# Patient Record
Sex: Female | Born: 1935 | Race: White | Hispanic: No | Smoking: Former smoker
Health system: Southern US, Community
[De-identification: ages and names within clinical notes are randomized; demographics above are authoritative.]

## PROBLEM LIST (undated history)

## (undated) DIAGNOSIS — R4189 Other symptoms and signs involving cognitive functions and awareness: Secondary | ICD-10-CM

## (undated) DIAGNOSIS — I10 Essential (primary) hypertension: Secondary | ICD-10-CM

## (undated) DIAGNOSIS — E785 Hyperlipidemia, unspecified: Secondary | ICD-10-CM

## (undated) DIAGNOSIS — M1990 Unspecified osteoarthritis, unspecified site: Secondary | ICD-10-CM

## (undated) DIAGNOSIS — E079 Disorder of thyroid, unspecified: Secondary | ICD-10-CM

## (undated) DIAGNOSIS — F0280 Dementia in other diseases classified elsewhere without behavioral disturbance: Secondary | ICD-10-CM

## (undated) HISTORY — DX: Essential (primary) hypertension: I10

## (undated) HISTORY — PX: VAGINAL HYSTERECTOMY: SUR661

## (undated) HISTORY — PX: FOOT SURGERY: SHX648

## (undated) HISTORY — DX: Hyperlipidemia, unspecified: E78.5

## (undated) HISTORY — DX: Disorder of thyroid, unspecified: E07.9

## (undated) HISTORY — DX: Other symptoms and signs involving cognitive functions and awareness: R41.89

## (undated) HISTORY — PX: OTHER SURGICAL HISTORY: SHX169

## (undated) HISTORY — DX: Unspecified osteoarthritis, unspecified site: M19.90

## (undated) HISTORY — PX: HAND SURGERY: SHX662

---

## 1998-12-10 LAB — HM COLONOSCOPY

## 1999-07-10 ENCOUNTER — Encounter: Admission: RE | Admit: 1999-07-10 | Discharge: 1999-07-10 | Payer: Self-pay | Admitting: Family Medicine

## 1999-07-11 ENCOUNTER — Encounter: Admission: RE | Admit: 1999-07-11 | Discharge: 1999-10-09 | Payer: Self-pay | Admitting: Family Medicine

## 2000-08-02 ENCOUNTER — Other Ambulatory Visit: Admission: RE | Admit: 2000-08-02 | Discharge: 2000-08-02 | Payer: Self-pay | Admitting: Family Medicine

## 2004-12-15 ENCOUNTER — Ambulatory Visit (HOSPITAL_COMMUNITY): Admission: RE | Admit: 2004-12-15 | Discharge: 2004-12-15 | Payer: Self-pay | Admitting: Gastroenterology

## 2011-03-28 ENCOUNTER — Encounter: Payer: Self-pay | Admitting: Nurse Practitioner

## 2013-03-11 ENCOUNTER — Other Ambulatory Visit: Payer: Self-pay | Admitting: Nurse Practitioner

## 2013-03-11 ENCOUNTER — Telehealth: Payer: Self-pay | Admitting: Nurse Practitioner

## 2013-03-11 MED ORDER — MOEXIPRIL-HYDROCHLOROTHIAZIDE 15-25 MG PO TABS: 1.0000 | ORAL_TABLET | Freq: Every day | ORAL | Status: DC

## 2013-03-11 NOTE — Telephone Encounter (Signed)
Yes please call into [facility] pharmacy

## 2013-03-11 NOTE — Progress Notes (Signed)
SENT TO PHARMACY 

## 2013-03-11 NOTE — Telephone Encounter (Signed)
Moexipril and lisinopril are the same thing. Do I need to dispense as written so you always get moexipril?

## 2013-03-11 NOTE — Telephone Encounter (Signed)
Please advise 

## 2013-03-11 NOTE — Telephone Encounter (Signed)
Please call patient

## 2013-03-12 ENCOUNTER — Other Ambulatory Visit: Payer: Self-pay | Admitting: Nurse Practitioner

## 2013-03-12 NOTE — Telephone Encounter (Signed)
Already taken care of

## 2013-03-26 ENCOUNTER — Telehealth: Payer: Self-pay | Admitting: Nurse Practitioner

## 2013-03-26 NOTE — Telephone Encounter (Signed)
Samples up front. Pt notified 

## 2013-05-12 ENCOUNTER — Other Ambulatory Visit: Payer: Self-pay

## 2013-05-12 MED ORDER — DIAZEPAM 5 MG PO TABS: 5.0000 mg | ORAL_TABLET | Freq: Four times a day (QID) | ORAL | Status: DC | PRN

## 2013-05-12 NOTE — Telephone Encounter (Signed)
Last seen 3/14  If approved please call in and have nurse notify patient

## 2013-05-12 NOTE — Telephone Encounter (Signed)
Please call in rx for diazepam with 1 refill

## 2013-05-12 NOTE — Telephone Encounter (Signed)
Called in.

## 2013-06-02 ENCOUNTER — Telehealth: Payer: Self-pay | Admitting: Nurse Practitioner

## 2013-06-02 NOTE — Telephone Encounter (Signed)
PT AWARE WE ARE OUT OF CRESTOR

## 2013-06-08 ENCOUNTER — Telehealth: Payer: Self-pay | Admitting: Nurse Practitioner

## 2013-06-08 NOTE — Telephone Encounter (Signed)
No samples available 

## 2013-06-17 ENCOUNTER — Telehealth: Payer: Self-pay | Admitting: Nurse Practitioner

## 2013-06-17 NOTE — Telephone Encounter (Signed)
Patient aware we are out 

## 2013-06-22 ENCOUNTER — Telehealth: Payer: Self-pay | Admitting: Nurse Practitioner

## 2013-06-22 NOTE — Telephone Encounter (Signed)
Pt aware - samples up front  

## 2013-08-12 ENCOUNTER — Telehealth: Payer: Self-pay | Admitting: Nurse Practitioner

## 2013-08-17 NOTE — Telephone Encounter (Signed)
Currently out- check with [place] next week

## 2013-08-17 NOTE — Telephone Encounter (Signed)
Pt aware.

## 2013-08-17 NOTE — Telephone Encounter (Signed)
Do not see crestor on med list

## 2013-08-20 ENCOUNTER — Other Ambulatory Visit (INDEPENDENT_AMBULATORY_CARE_PROVIDER_SITE_OTHER): Payer: Medicare Other

## 2013-08-20 DIAGNOSIS — I10 Essential (primary) hypertension: Secondary | ICD-10-CM

## 2013-08-20 DIAGNOSIS — E785 Hyperlipidemia, unspecified: Secondary | ICD-10-CM

## 2013-08-20 DIAGNOSIS — E039 Hypothyroidism, unspecified: Secondary | ICD-10-CM

## 2013-08-22 LAB — CMP14+EGFR
AST: 25 IU/L (ref 0–40)
Albumin/Globulin Ratio: 1.8 (ref 1.1–2.5)
Alkaline Phosphatase: 65 IU/L (ref 39–117)
BUN/Creatinine Ratio: 15 (ref 11–26)
CO2: 29 mmol/L (ref 18–29)
Creatinine, Ser: 0.84 mg/dL (ref 0.57–1.00)
GFR calc non Af Amer: 66 mL/min/{1.73_m2} (ref >59)
Globulin, Total: 2.4 g/dL (ref 1.5–4.5)
Sodium: 140 mmol/L (ref 134–144)
Total Bilirubin: 0.8 mg/dL (ref 0.0–1.2)

## 2013-08-22 LAB — NMR, LIPOPROFILE
HDL Particle Number: 34.3 umol/L (ref >=30.5)
LP-IR Score: 59 — ABNORMAL HIGH (ref <=45)
Small LDL Particle Number: 1029 nmol/L — ABNORMAL HIGH (ref <=527)

## 2013-08-22 LAB — THYROID PANEL WITH TSH: TSH: 0.544 u[IU]/mL (ref 0.450–4.500)

## 2013-08-24 ENCOUNTER — Ambulatory Visit: Payer: Self-pay | Admitting: Nurse Practitioner

## 2013-08-26 ENCOUNTER — Encounter: Payer: Self-pay | Admitting: Nurse Practitioner

## 2013-08-26 ENCOUNTER — Ambulatory Visit (INDEPENDENT_AMBULATORY_CARE_PROVIDER_SITE_OTHER): Payer: Medicare Other | Admitting: Nurse Practitioner

## 2013-08-26 VITALS — BP 117/64 | HR 67 | Temp 96.9°F | Ht 63.0 in | Wt 145.0 lb

## 2013-08-26 DIAGNOSIS — E785 Hyperlipidemia, unspecified: Secondary | ICD-10-CM | POA: Insufficient documentation

## 2013-08-26 DIAGNOSIS — G609 Hereditary and idiopathic neuropathy, unspecified: Secondary | ICD-10-CM

## 2013-08-26 DIAGNOSIS — G5793 Unspecified mononeuropathy of bilateral lower limbs: Secondary | ICD-10-CM | POA: Insufficient documentation

## 2013-08-26 DIAGNOSIS — I10 Essential (primary) hypertension: Secondary | ICD-10-CM | POA: Insufficient documentation

## 2013-08-26 DIAGNOSIS — E039 Hypothyroidism, unspecified: Secondary | ICD-10-CM | POA: Insufficient documentation

## 2013-08-26 DIAGNOSIS — K5790 Diverticulosis of intestine, part unspecified, without perforation or abscess without bleeding: Secondary | ICD-10-CM | POA: Insufficient documentation

## 2013-08-26 DIAGNOSIS — K5730 Diverticulosis of large intestine without perforation or abscess without bleeding: Secondary | ICD-10-CM

## 2013-08-26 DIAGNOSIS — F411 Generalized anxiety disorder: Secondary | ICD-10-CM

## 2013-08-26 MED ORDER — DIAZEPAM 5 MG PO TABS: 5.0000 mg | ORAL_TABLET | Freq: Four times a day (QID) | ORAL | Status: DC | PRN

## 2013-08-26 MED ORDER — ROSUVASTATIN CALCIUM 10 MG PO TABS: 10.0000 mg | ORAL_TABLET | Freq: Every day | ORAL | Status: DC

## 2013-08-26 MED ORDER — MOEXIPRIL-HYDROCHLOROTHIAZIDE 15-25 MG PO TABS: 1.0000 | ORAL_TABLET | Freq: Every day | ORAL | Status: DC

## 2013-08-26 MED ORDER — IBUPROFEN 600 MG PO TABS: 600.0000 mg | ORAL_TABLET | Freq: Two times a day (BID) | ORAL | Status: DC

## 2013-08-26 MED ORDER — LEVOTHYROXINE SODIUM 75 MCG PO TABS: 75.0000 ug | ORAL_TABLET | Freq: Every day | ORAL | Status: DC

## 2013-08-26 NOTE — Progress Notes (Signed)
Subjective:    Patient ID: April [name], female    DOB: [birth year], 79 y.o.   MRN: [id number]  Hypertension This is a chronic problem. The current episode started more than 1 year ago. The problem is unchanged. The problem is controlled. Pertinent negatives include no blurred vision, chest pain, headaches, neck pain, palpitations, peripheral edema, PND or shortness of breath. There are no associated agents to hypertension. Risk factors for coronary artery disease include dyslipidemia, family history, post-menopausal state and stress. Past treatments include ACE inhibitors and diuretics. The current treatment provides significant improvement. Compliance problems include diet and exercise.  Hypertensive end-organ damage includes a thyroid problem.  Hyperlipidemia This is a chronic problem. The current episode started more than 1 year ago. The problem is uncontrolled. Recent lipid tests were reviewed and are normal. Pertinent negatives include no chest pain or shortness of breath. Current antihyperlipidemic treatment includes statins. The current treatment provides significant improvement of lipids. Compliance problems include adherence to diet and adherence to exercise.  Risk factors for coronary artery disease include hypertension, post-menopausal, stress and family history.  Thyroid Problem Presents for follow-up (hypothyroidism) visit. Patient reports no cold intolerance, constipation, depressed mood, diaphoresis, dry skin, fatigue, hair loss, heat intolerance, hoarse voice, leg swelling, palpitations, visual change, weight gain or weight loss. The symptoms have been stable. Her past medical history is significant for hyperlipidemia.  GAD Diazepam only prn- doesn't need very often   Review of Systems  Constitutional: Negative for weight loss, weight gain, diaphoresis and fatigue.  HENT: Negative for hoarse voice and neck pain.   Eyes: Negative for blurred vision.  Respiratory: Negative for  shortness of breath.   Cardiovascular: Negative for chest pain, palpitations and PND.  Gastrointestinal: Negative for constipation.  Endocrine: Negative for cold intolerance and heat intolerance.  Neurological: Negative for headaches.  All other systems reviewed and are negative.       Objective:   Physical Exam  Constitutional: She is oriented to person, place, and time. She appears well-developed and well-nourished.  HENT:  Nose: Nose normal.  Mouth/Throat: Oropharynx is clear and moist.  Eyes: EOM are normal.  Neck: Trachea normal, normal range of motion and full passive range of motion without pain. Neck supple. No JVD present. Carotid bruit is not present. No thyromegaly present.  Cardiovascular: Normal rate, regular rhythm, normal heart sounds and intact distal pulses.  Exam reveals no gallop and no friction rub.   No murmur heard. Pulmonary/Chest: Effort normal and breath sounds normal.  Abdominal: Soft. Bowel sounds are normal. She exhibits no distension and no mass. There is no tenderness.  Musculoskeletal: Normal range of motion.  Lymphadenopathy:    She has no cervical adenopathy.  Neurological: She is alert and oriented to person, place, and time. She has normal reflexes.  Skin: Skin is warm and dry.  Psychiatric: She has a normal mood and affect. Her behavior is normal. Judgment and thought content normal.   BP 117/64  Pulse 67  Temp(Src) 96.9 F (36.1 C) (Oral)  Ht 5\' 3"  (1.6 m)  Wt 145 lb (65.772 kg)  BMI 25.69 kg/m2        Assessment & Plan:   1. Hyperlipidemia LDL goal < 100   2. Hypothyroidism   3. Hypertension   4. Diverticulosis   5. GAD (generalized anxiety disorder)   6. Neuropathy of both feet    Meds ordered this encounter  Medications  . DISCONTD: rosuvastatin (CRESTOR) 10 MG tablet    Sig: Take  10 mg by mouth daily.  Marland Kitchen levothyroxine (SYNTHROID, LEVOTHROID) 75 MCG tablet    Sig: Take 1 tablet (75 mcg total) by mouth daily before  breakfast.    Dispense:  30 tablet    Refill:  9    Order Specific Question:  Supervising Provider    Answer:  Ernestina Penna [[id number]]  . diazepam (VALIUM) 5 MG tablet    Sig: Take 1 tablet (5 mg total) by mouth every 6 (six) hours as needed.    Dispense:  30 tablet    Refill:  1    Order Specific Question:  Supervising Provider    Answer:  Ernestina Penna [[id number]]  . rosuvastatin (CRESTOR) 10 MG tablet    Sig: Take 1 tablet (10 mg total) by mouth daily.    Dispense:  30 tablet    Refill:  5    Order Specific Question:  Supervising Provider    Answer:  Ernestina Penna [[id number]]  . moexipril-hydrochlorothiazide (UNIRETIC) 15-25 MG per tablet    Sig: Take 1 tablet by mouth daily.    Dispense:  30 tablet    Refill:  5    Order Specific Question:  Supervising Provider    Answer:  Ernestina Penna [[id number]]   Labs reviewed at appointment Low fat diet and exercise encouraged Follow up in 3 months health maintenance reviewed  [name] Daphine Deutscher, FNP

## 2013-08-26 NOTE — Patient Instructions (Signed)

## 2013-09-01 ENCOUNTER — Telehealth: Payer: Self-pay | Admitting: Nurse Practitioner

## 2013-09-02 NOTE — Telephone Encounter (Signed)
FOUR PACKS OF CRESTOR 20 MG SAMPLES, (TAKE ONE HALF) AT FRONT DESK FOR PT.

## 2013-10-06 ENCOUNTER — Telehealth: Payer: Self-pay | Admitting: Nurse Practitioner

## 2013-10-06 NOTE — Telephone Encounter (Signed)
Up front 

## 2013-12-28 ENCOUNTER — Telehealth: Payer: Self-pay | Admitting: Nurse Practitioner

## 2013-12-28 NOTE — Telephone Encounter (Signed)
Patient aware no samples available 

## 2013-12-31 ENCOUNTER — Telehealth: Payer: Self-pay | Admitting: Nurse Practitioner

## 2013-12-31 NOTE — Telephone Encounter (Signed)
Chart shows that patient is taking Crestor 10mg . Does she want 20mg  to cut them in half or is she taking 20mg ? Left this question on her home voicemail. I explained that I would leave samples up front but that she needs to let us know which dosage she has been taking.  Crestor 20mg  #35 left up front.

## 2014-01-13 NOTE — Telephone Encounter (Signed)
SYNTHROID CALLED IN 12-31-13.

## 2014-02-01 ENCOUNTER — Other Ambulatory Visit (INDEPENDENT_AMBULATORY_CARE_PROVIDER_SITE_OTHER): Payer: Medicare Other

## 2014-02-01 DIAGNOSIS — E039 Hypothyroidism, unspecified: Secondary | ICD-10-CM

## 2014-02-01 DIAGNOSIS — E785 Hyperlipidemia, unspecified: Secondary | ICD-10-CM

## 2014-02-03 LAB — THYROID PANEL WITH TSH
FREE THYROXINE INDEX: 3.4 (ref 1.2–4.9)
T3 Uptake Ratio: 35 % (ref 24–39)
T4 TOTAL: 9.6 ug/dL (ref 4.5–12.0)
TSH: 0.855 u[IU]/mL (ref 0.450–4.500)

## 2014-02-03 LAB — CMP14+EGFR
ALT: 21 IU/L (ref 0–32)
AST: 28 IU/L (ref 0–40)
Albumin/Globulin Ratio: 2.1 (ref 1.1–2.5)
Albumin: 4.7 g/dL (ref 3.5–4.8)
Alkaline Phosphatase: 54 IU/L (ref 39–117)
BUN/Creatinine Ratio: 20 (ref 11–26)
BUN: 17 mg/dL (ref 8–27)
CALCIUM: 9.9 mg/dL (ref 8.7–10.3)
CHLORIDE: 97 mmol/L (ref 97–108)
CO2: 24 mmol/L (ref 18–29)
CREATININE: 0.85 mg/dL (ref 0.57–1.00)
GFR calc Af Amer: 75 mL/min/{1.73_m2} (ref >59)
GFR calc non Af Amer: 65 mL/min/{1.73_m2} (ref >59)
Globulin, Total: 2.2 g/dL (ref 1.5–4.5)
Glucose: 117 mg/dL — ABNORMAL HIGH (ref 65–99)
POTASSIUM: 4.2 mmol/L (ref 3.5–5.2)
SODIUM: 140 mmol/L (ref 134–144)
Total Bilirubin: 1 mg/dL (ref 0.0–1.2)
Total Protein: 6.9 g/dL (ref 6.0–8.5)

## 2014-02-03 LAB — NMR, LIPOPROFILE
Cholesterol: 141 mg/dL (ref <200)
HDL CHOLESTEROL BY NMR: 43 mg/dL (ref >=40)
HDL Particle Number: 36.7 umol/L (ref >=30.5)
LDL PARTICLE NUMBER: 1589 nmol/L — AB (ref <1000)
LDL Size: 20.1 nm — ABNORMAL LOW (ref >20.5)
LDLC SERPL CALC-MCNC: 65 mg/dL (ref <100)
LP-IR Score: 57 — ABNORMAL HIGH (ref <=45)
Small LDL Particle Number: 1111 nmol/L — ABNORMAL HIGH (ref <=527)
Triglycerides by NMR: 167 mg/dL — ABNORMAL HIGH (ref <150)

## 2014-02-08 ENCOUNTER — Encounter: Payer: Self-pay | Admitting: Nurse Practitioner

## 2014-02-08 ENCOUNTER — Ambulatory Visit (INDEPENDENT_AMBULATORY_CARE_PROVIDER_SITE_OTHER): Payer: Medicare Other | Admitting: Nurse Practitioner

## 2014-02-08 VITALS — BP 134/76 | HR 70 | Temp 97.0°F | Ht 63.0 in | Wt 159.0 lb

## 2014-02-08 DIAGNOSIS — E785 Hyperlipidemia, unspecified: Secondary | ICD-10-CM

## 2014-02-08 DIAGNOSIS — E039 Hypothyroidism, unspecified: Secondary | ICD-10-CM

## 2014-02-08 DIAGNOSIS — K5790 Diverticulosis of intestine, part unspecified, without perforation or abscess without bleeding: Secondary | ICD-10-CM

## 2014-02-08 DIAGNOSIS — F411 Generalized anxiety disorder: Secondary | ICD-10-CM

## 2014-02-08 DIAGNOSIS — Z13820 Encounter for screening for osteoporosis: Secondary | ICD-10-CM

## 2014-02-08 DIAGNOSIS — G5793 Unspecified mononeuropathy of bilateral lower limbs: Secondary | ICD-10-CM

## 2014-02-08 DIAGNOSIS — G609 Hereditary and idiopathic neuropathy, unspecified: Secondary | ICD-10-CM

## 2014-02-08 DIAGNOSIS — I10 Essential (primary) hypertension: Secondary | ICD-10-CM

## 2014-02-08 DIAGNOSIS — K5730 Diverticulosis of large intestine without perforation or abscess without bleeding: Secondary | ICD-10-CM

## 2014-02-08 MED ORDER — LEVOTHYROXINE SODIUM 75 MCG PO TABS: 75.0000 ug | ORAL_TABLET | Freq: Every day | ORAL | Status: DC

## 2014-02-08 MED ORDER — HYDROCHLOROTHIAZIDE 25 MG PO TABS: 25.0000 mg | ORAL_TABLET | Freq: Every day | ORAL | Status: DC

## 2014-02-08 MED ORDER — GABAPENTIN 300 MG PO CAPS: ORAL_CAPSULE | ORAL | Status: DC

## 2014-02-08 MED ORDER — MOEXIPRIL HCL 15 MG PO TABS: 15.0000 mg | ORAL_TABLET | Freq: Every day | ORAL | Status: DC

## 2014-02-08 MED ORDER — ROSUVASTATIN CALCIUM 10 MG PO TABS: 10.0000 mg | ORAL_TABLET | Freq: Every day | ORAL | Status: DC

## 2014-02-08 MED ORDER — DIAZEPAM 5 MG PO TABS: 5.0000 mg | ORAL_TABLET | Freq: Four times a day (QID) | ORAL | Status: DC | PRN

## 2014-02-08 NOTE — Patient Instructions (Signed)

## 2014-02-08 NOTE — Progress Notes (Signed)
Subjective:    Patient ID: April [name], female    DOB: November 25, 1932, 80 y.o.   MRN: [id number]  HPI Pt here today for chronic medical follow up.  States food has tasted different to her over the last 6 months.  The only flavors she notices are very sweet or salty.  Reports difficulty sleeping every night of the week.  She has difficulty with both falling asleep and staying asleep.  Taking Valium at bedtime to help . Hypertension This is a chronic problem. The current episode started more than 1 year ago. The problem is unchanged. The problem is controlled. Pertinent negatives include no blurred vision, chest pain, headaches, neck pain, palpitations, peripheral edema, PND or shortness of breath. There are no associated agents to hypertension. Risk factors for coronary artery disease include dyslipidemia, family history, post-menopausal state and stress. Past treatments include ACE inhibitors and diuretics. The current treatment provides significant improvement. Compliance problems include diet and exercise.  Hypertensive end-organ damage includes a thyroid problem.  Hyperlipidemia This is a chronic problem. The current episode started more than 1 year ago. The problem is uncontrolled. Recent lipid tests were reviewed and are normal. Pertinent negatives include no chest pain or shortness of breath. Current antihyperlipidemic treatment includes statins. The current treatment provides significant improvement of lipids. Compliance problems include adherence to diet and adherence to exercise.  Risk factors for coronary artery disease include hypertension, post-menopausal, stress and family history.  Thyroid Problem Presents for follow-up (hypothyroidism) visit. Patient reports no cold intolerance, constipation, depressed mood, diaphoresis, dry skin, fatigue, hair loss, heat intolerance, hoarse voice, leg swelling, palpitations, visual change, weight gain or weight loss. The symptoms have been stable. Her past  medical history is significant for hyperlipidemia.  GAD Diazepam only prn- doesn't need very often   Review of Systems  Constitutional: Positive for appetite change. Negative for fever, activity change and fatigue.  HENT: Negative for congestion, postnasal drip and sore throat.   Respiratory: Negative for shortness of breath.   Cardiovascular: Negative for chest pain, palpitations and leg swelling.  Gastrointestinal: Negative for constipation and blood in stool.  Endocrine: Negative for cold intolerance and heat intolerance.  Genitourinary: Negative for urgency and frequency.  Musculoskeletal: Negative for neck stiffness.  Skin: Negative for rash.  Neurological: Negative for dizziness, weakness and headaches.  Psychiatric/Behavioral: Negative for confusion and agitation. The patient is not nervous/anxious.   All other systems reviewed and are negative.       Objective:   Physical Exam  Constitutional: She is oriented to person, place, and time. She appears well-developed and well-nourished.  HENT:  Head: Normocephalic and atraumatic.  Right Ear: External ear normal.  Left Ear: External ear normal.  Mouth/Throat: Oropharynx is clear and moist.  Eyes: Conjunctivae are normal.  Neck: Normal range of motion. No thyromegaly present.  Cardiovascular: Normal rate, regular rhythm and normal heart sounds.  Exam reveals no gallop and no friction rub.   No murmur heard. Pulmonary/Chest: Effort normal and breath sounds normal. She has no wheezes.  Abdominal: Soft. Bowel sounds are normal.  Musculoskeletal: Normal range of motion.  Lymphadenopathy:    She has no cervical adenopathy.  Neurological: She is alert and oriented to person, place, and time. She has normal reflexes. No cranial nerve deficit. She exhibits normal muscle tone. Coordination normal.  Skin: Skin is warm and dry. No rash noted.  Psychiatric: She has a normal mood and affect. Her behavior is normal.     BP 134/76  Pulse 70  Temp(Src) 97 F (36.1 C) (Oral)  Ht _0  (1.6 m)  Wt 159 lb (72.122 kg)  BMI 28.17 kg/m2       Assessment & Plan:   1. Hypothyroidism   2. Hypertension   3. Hyperlipidemia LDL goal < 100   4. GAD (generalized anxiety disorder)   5. Diverticulosis   6. Neuropathy of both feet   7. Screening for osteoporosis    Orders Placed This Encounter  Procedures  . DG Bone Density    Standing Status: Future     Number of Occurrences:      Standing Expiration Date: 04/10/2015    Order Specific Question:  Reason for Exam (SYMPTOM  OR DIAGNOSIS REQUIRED)    Answer:  screening    Order Specific Question:  Preferred imaging location?    Answer:  Internal  . CMP14+EGFR  . NMR, lipoprofile  . Thyroid Panel With TSH   Meds ordered this encounter  Medications  . DISCONTD: hydrochlorothiazide (HYDRODIURIL) 25 MG tablet    Sig:   . DISCONTD: moexipril (UNIVASC) 15 MG tablet    Sig:   . diazepam (VALIUM) 5 MG tablet    Sig: Take 1 tablet (5 mg total) by mouth every 6 (six) hours as needed.    Dispense:  30 tablet    Refill:  1    Order Specific Question:  Supervising Provider    Answer:  Chipper Herb [[id number]]  . gabapentin (NEURONTIN) 300 MG capsule    Sig: TAKE 1 CAPSULE AT BEDTIME    Dispense:  30 capsule    Refill:  5    Order Specific Question:  Supervising Provider    Answer:  Chipper Herb [[id number]]  . hydrochlorothiazide (HYDRODIURIL) 25 MG tablet    Sig: Take 1 tablet (25 mg total) by mouth daily.    Dispense:  30 tablet    Refill:  5    Order Specific Question:  Supervising Provider    Answer:  Chipper Herb [[id number]]  . levothyroxine (SYNTHROID, LEVOTHROID) 75 MCG tablet    Sig: Take 1 tablet (75 mcg total) by mouth daily before breakfast.    Dispense:  30 tablet    Refill:  5    Order Specific Question:  Supervising Provider    Answer:  Chipper Herb [[id number]]  . moexipril (UNIVASC) 15 MG tablet    Sig: Take 1 tablet (15 mg total) by mouth at bedtime.     Dispense:  30 tablet    Refill:  5    Order Specific Question:  Supervising Provider    Answer:  Chipper Herb [[id number]]  . rosuvastatin (CRESTOR) 10 MG tablet    Sig: Take 1 tablet (10 mg total) by mouth daily.    Dispense:  30 tablet    Refill:  5    Order Specific Question:  Supervising Provider    Answer:  Chipper Herb [[id number]]    hemoccult cards given with instructions Discussed  With Patient the use of valium Lab results discussed at appointment Health maintenance reviewed Diet and exercise encouraged Continue all meds Follow up  In 3 months   [place], FNP  .med

## 2014-03-24 ENCOUNTER — Ambulatory Visit: Payer: Medicare Other

## 2014-03-24 ENCOUNTER — Other Ambulatory Visit: Payer: Medicare Other

## 2014-08-19 ENCOUNTER — Other Ambulatory Visit: Payer: Medicare Other

## 2014-08-19 DIAGNOSIS — I10 Essential (primary) hypertension: Secondary | ICD-10-CM

## 2014-08-19 DIAGNOSIS — E785 Hyperlipidemia, unspecified: Secondary | ICD-10-CM

## 2014-08-19 DIAGNOSIS — E039 Hypothyroidism, unspecified: Secondary | ICD-10-CM

## 2014-08-20 LAB — CMP14+EGFR
ALBUMIN: 4.6 g/dL (ref 3.5–4.7)
ALT: 14 IU/L (ref 0–32)
AST: 21 IU/L (ref 0–40)
Albumin/Globulin Ratio: 2 (ref 1.1–2.5)
Alkaline Phosphatase: 70 IU/L (ref 39–117)
BILIRUBIN TOTAL: 0.9 mg/dL (ref 0.0–1.2)
BUN / CREAT RATIO: 15 (ref 11–26)
BUN: 14 mg/dL (ref 8–27)
CO2: 24 mmol/L (ref 18–29)
Calcium: 9.7 mg/dL (ref 8.7–10.3)
Chloride: 96 mmol/L — ABNORMAL LOW (ref 97–108)
Creatinine, Ser: 0.93 mg/dL (ref 0.57–1.00)
GFR calc Af Amer: 67 mL/min/{1.73_m2} (ref >59)
GFR, EST NON AFRICAN AMERICAN: 58 mL/min/{1.73_m2} — AB (ref >59)
Globulin, Total: 2.3 g/dL (ref 1.5–4.5)
Glucose: 123 mg/dL — ABNORMAL HIGH (ref 65–99)
Potassium: 4.6 mmol/L (ref 3.5–5.2)
Sodium: 137 mmol/L (ref 134–144)
Total Protein: 6.9 g/dL (ref 6.0–8.5)

## 2014-08-20 LAB — THYROID PANEL WITH TSH
FREE THYROXINE INDEX: 3.7 (ref 1.2–4.9)
T3 Uptake Ratio: 36 % (ref 24–39)
T4, Total: 10.4 ug/dL (ref 4.5–12.0)
TSH: 0.302 u[IU]/mL — ABNORMAL LOW (ref 0.450–4.500)

## 2014-08-20 LAB — NMR, LIPOPROFILE
CHOLESTEROL: 290 mg/dL — AB (ref 100–199)
HDL Cholesterol by NMR: 41 mg/dL (ref >39)
HDL PARTICLE NUMBER: 31.8 umol/L (ref >=30.5)
LDL Particle Number: 2606 nmol/L — ABNORMAL HIGH (ref <1000)
LDL Size: 20.1 nm (ref >20.5)
LDLC SERPL CALC-MCNC: 201 mg/dL — AB (ref 0–99)
LP-IR Score: 50 — ABNORMAL HIGH (ref <=45)
Small LDL Particle Number: 1611 nmol/L — ABNORMAL HIGH (ref <=527)
Triglycerides by NMR: 241 mg/dL — ABNORMAL HIGH (ref 0–149)

## 2014-08-26 ENCOUNTER — Ambulatory Visit (INDEPENDENT_AMBULATORY_CARE_PROVIDER_SITE_OTHER): Payer: Medicare Other | Admitting: Nurse Practitioner

## 2014-08-26 ENCOUNTER — Encounter: Payer: Self-pay | Admitting: Nurse Practitioner

## 2014-08-26 VITALS — BP 132/85 | HR 68 | Temp 97.0°F | Ht 63.0 in | Wt 156.2 lb

## 2014-08-26 DIAGNOSIS — E785 Hyperlipidemia, unspecified: Secondary | ICD-10-CM

## 2014-08-26 DIAGNOSIS — E038 Other specified hypothyroidism: Secondary | ICD-10-CM

## 2014-08-26 DIAGNOSIS — K5730 Diverticulosis of large intestine without perforation or abscess without bleeding: Secondary | ICD-10-CM

## 2014-08-26 DIAGNOSIS — I10 Essential (primary) hypertension: Secondary | ICD-10-CM

## 2014-08-26 DIAGNOSIS — F411 Generalized anxiety disorder: Secondary | ICD-10-CM

## 2014-08-26 DIAGNOSIS — E034 Atrophy of thyroid (acquired): Secondary | ICD-10-CM

## 2014-08-26 DIAGNOSIS — G609 Hereditary and idiopathic neuropathy, unspecified: Secondary | ICD-10-CM

## 2014-08-26 DIAGNOSIS — G5793 Unspecified mononeuropathy of bilateral lower limbs: Secondary | ICD-10-CM

## 2014-08-26 DIAGNOSIS — E0789 Other specified disorders of thyroid: Secondary | ICD-10-CM

## 2014-08-26 DIAGNOSIS — Z13820 Encounter for screening for osteoporosis: Secondary | ICD-10-CM

## 2014-08-26 MED ORDER — HYDROCHLOROTHIAZIDE 25 MG PO TABS: 25.0000 mg | ORAL_TABLET | Freq: Every day | ORAL | Status: DC

## 2014-08-26 MED ORDER — MOEXIPRIL HCL 15 MG PO TABS: 15.0000 mg | ORAL_TABLET | Freq: Every day | ORAL | Status: DC

## 2014-08-26 MED ORDER — ROSUVASTATIN CALCIUM 10 MG PO TABS: 10.0000 mg | ORAL_TABLET | Freq: Every day | ORAL | Status: DC

## 2014-08-26 MED ORDER — LEVOTHYROXINE SODIUM 75 MCG PO TABS: 75.0000 ug | ORAL_TABLET | Freq: Every day | ORAL | Status: DC

## 2014-08-26 MED ORDER — GABAPENTIN 300 MG PO CAPS: ORAL_CAPSULE | ORAL | Status: DC

## 2014-08-26 MED ORDER — DIAZEPAM 5 MG PO TABS: 5.0000 mg | ORAL_TABLET | Freq: Four times a day (QID) | ORAL | Status: DC | PRN

## 2014-08-26 NOTE — Patient Instructions (Signed)

## 2014-08-26 NOTE — Progress Notes (Signed)
Subjective:    Patient ID: April [name], female    DOB: [birth year], 80 y.o.   MRN: [id number]  Patient here today for follow up of chronic medical problems. No complaints today.   Hypertension This is a chronic problem. The current episode started more than 1 year ago. The problem is unchanged. The problem is controlled. Pertinent negatives include no blurred vision, chest pain, headaches, neck pain, palpitations, peripheral edema, PND or shortness of breath. There are no associated agents to hypertension. Risk factors for coronary artery disease include dyslipidemia, family history, post-menopausal state and stress. Past treatments include ACE inhibitors and diuretics. The current treatment provides significant improvement. Compliance problems include diet and exercise.  Hypertensive end-organ damage includes a thyroid problem.  Hyperlipidemia This is a chronic problem. The current episode started more than 1 year ago. The problem is uncontrolled. Recent lipid tests were reviewed and are normal. Pertinent negatives include no chest pain or shortness of breath. Current antihyperlipidemic treatment includes statins. The current treatment provides significant improvement of lipids. Compliance problems include adherence to diet and adherence to exercise.  Risk factors for coronary artery disease include hypertension, post-menopausal, stress and family history.  Thyroid Problem Presents for follow-up (hypothyroidism) visit. Patient reports no cold intolerance, constipation, depressed mood, diaphoresis, dry skin, fatigue, hair loss, heat intolerance, hoarse voice, leg swelling, palpitations, visual change, weight gain or weight loss. The symptoms have been stable. Her past medical history is significant for hyperlipidemia.  GAD Diazepam only prn- doesn't need very often Diverticulosis Been years since she had a flare up- watches diet  Review of Systems  Constitutional: Negative for weight loss, weight  gain, diaphoresis and fatigue.  HENT: Negative for hoarse voice.   Eyes: Negative for blurred vision.  Respiratory: Negative for shortness of breath.   Cardiovascular: Negative for chest pain, palpitations and PND.  Gastrointestinal: Negative for constipation.  Endocrine: Negative for cold intolerance and heat intolerance.  Musculoskeletal: Negative for neck pain.  Neurological: Negative for headaches.  All other systems reviewed and are negative.      Objective:   Physical Exam  Constitutional: She is oriented to person, place, and time. She appears well-developed and well-nourished.  HENT:  Nose: Nose normal.  Mouth/Throat: Oropharynx is clear and moist.  Eyes: EOM are normal.  Neck: Trachea normal, normal range of motion and full passive range of motion without pain. Neck supple. No JVD present. Carotid bruit is not present. No thyromegaly present.  Cardiovascular: Normal rate, regular rhythm, normal heart sounds and intact distal pulses.  Exam reveals no gallop and no friction rub.   No murmur heard. Pulmonary/Chest: Effort normal and breath sounds normal.  Abdominal: Soft. Bowel sounds are normal. She exhibits no distension and no mass. There is no tenderness.  Musculoskeletal: Normal range of motion.  Lymphadenopathy:    She has no cervical adenopathy.  Neurological: She is alert and oriented to person, place, and time. She has normal reflexes.  Skin: Skin is warm and dry.  Psychiatric: She has a normal mood and affect. Her behavior is normal. Judgment and thought content normal.   BP 132/85  Pulse 68  Temp(Src) 97 F (36.1 C) (Oral)  Ht _0  (1.6 m)  Wt 156 lb 3.2 oz (70.852 kg)  BMI 27.68 kg/m2        Assessment & Plan:   1. Neuropathy of both feet - gabapentin (NEURONTIN) 300 MG capsule; TAKE 1 CAPSULE AT BEDTIME  Dispense: 30 capsule; Refill: 5  2. Hypothyroidism  due to acquired atrophy of thyroid - Thyroid Panel With TSH - levothyroxine (SYNTHROID,  LEVOTHROID) 75 MCG tablet; Take 1 tablet (75 mcg total) by mouth daily before breakfast.  Dispense: 30 tablet; Refill: 5  3. Essential hypertension Avoid salt in diet - CMP14+EGFR - moexipril (UNIVASC) 15 MG tablet; Take 1 tablet (15 mg total) by mouth at bedtime.  Dispense: 30 tablet; Refill: 5 - hydrochlorothiazide (HYDRODIURIL) 25 MG tablet; Take 1 tablet (25 mg total) by mouth daily.  Dispense: 30 tablet; Refill: 5  4. Hyperlipidemia with target LDL less than 100 - NMR, lipoprofile - rosuvastatin (CRESTOR) 10 MG tablet; Take 1 tablet (10 mg total) by mouth daily.  Dispense: 30 tablet; Refill: 5  5. GAD (generalized anxiety disorder) Stress management - diazepam (VALIUM) 5 MG tablet; Take 1 tablet (5 mg total) by mouth every 6 (six) hours as needed.  Dispense: 30 tablet; Refill: 1  6. Diverticulosis of large intestine without hemorrhage Continue  To watch diet and avoid things with small seeds  7. Screening for osteoporosis - DG Bone Density; Future   Labs pending Health maintenance reviewed Diet and exercise encouraged Continue all meds Follow up  In 3 months    [place], FNP

## 2014-10-12 ENCOUNTER — Encounter: Payer: Self-pay | Admitting: Neurology

## 2014-10-12 ENCOUNTER — Ambulatory Visit (INDEPENDENT_AMBULATORY_CARE_PROVIDER_SITE_OTHER): Payer: Medicare Other | Admitting: Neurology

## 2014-10-12 VITALS — BP 181/84 | HR 83 | Temp 97.8°F | Ht 65.0 in | Wt 157.0 lb

## 2014-10-12 DIAGNOSIS — G4700 Insomnia, unspecified: Secondary | ICD-10-CM

## 2014-10-12 DIAGNOSIS — R4189 Other symptoms and signs involving cognitive functions and awareness: Secondary | ICD-10-CM

## 2014-10-12 DIAGNOSIS — F0390 Unspecified dementia without behavioral disturbance: Secondary | ICD-10-CM | POA: Insufficient documentation

## 2014-10-12 DIAGNOSIS — F411 Generalized anxiety disorder: Secondary | ICD-10-CM

## 2014-10-12 DIAGNOSIS — G3184 Mild cognitive impairment, so stated: Secondary | ICD-10-CM

## 2014-10-12 MED ORDER — DONEPEZIL HCL 5 MG PO TABS: 5.0000 mg | ORAL_TABLET | Freq: Every day | ORAL | Status: DC

## 2014-10-12 NOTE — Progress Notes (Addendum)
[facility]    Provider:  Dr Jaynee Eagles Referring Provider: Chipper Herb, MD Primary Care Physician:  Stephens Shire, MD  CC:  Dementia  HPI:  April [name] is a 80 y.o. female here as a referral from Dr. Laurance Flatten for cognitive impairment. Husband provides additional history. Memory problems started 2 years ago. She was at a social event when she asked the same questions 2 times and didn't know it until someone told her. She couldn't remember what she was having for lunch. She doesn't forget appointments. She just forgets little things. Her husband says the other day she was trying to lock the car door but it was still running - she forgot to turn the car off. She loses her keys. Doesn't usually forget names even of people who she just met. Not getting lost. She forgot briefly who cuts her hair but then remembered. It is scaring her. Still driving. Bills are paid on time. Still cooking and cleaning and grocery shopping and independently living. It is little things that are scaring her. No personality changes, hallucinations or delusions. Denies depression or anxiety.  No snoring or apneic episodes. She couldn't sleep at all last night. Has some insomnia. Has a sister with dementia. She can't get to sleep because she worries so much, doesn't want to be like her sister. She is worrying so much that she drank some wine to try and get to sleep. No problems getting around, no tremor.no incontinence. Feels her cognitive changes are progressing.   She takes neurontin at night for peripheral neuropathy, burning in the feet, which is stable and she believes caused by statin use. She has Xanax for anxiety but she doesn't take it often. Insomnia is worsening.   Reviewed notes, labs and imaging from outside physicians, which showed: She has HLD, hypothyroid, HTN. CMP unremarkable, TSH mildly low (.302) and is followed by her pcp  Review of Systems: Patient complains of symptoms per HPI as  well as the following symptoms: memory loss, confusion, cramps, headaches, not enough sleep, moles. Pertinent negatives per HPI. All others negative.   History   Social History  . Marital Status: Married    Spouse Name: N/A    Number of Children: N/A  . Years of Education: N/A   Occupational History  . Not on file.   Social History Main Topics  . Smoking status: Former Research scientist (life sciences)  . Smokeless tobacco: Not on file  . Alcohol Use: No  . Drug Use: No  . Sexual Activity: Not on file   Other Topics Concern  . Not on file   Social History Narrative    Family History  Problem Relation Age of Onset  . Pneumonia Father   . Dementia Sister     Past Medical History  Diagnosis Date  . Hyperlipidemia   . Thyroid disease   . Arthritis   . Hypertension     History reviewed. No pertinent past surgical history.  Current Outpatient Prescriptions  Medication Sig Dispense Refill  . aspirin 81 MG tablet Take 81 mg by mouth daily.      . Cholecalciferol (VITAMIN D3) 2000 UNITS TABS Take 1 tablet by mouth daily.      . diazepam (VALIUM) 5 MG tablet Take 1 tablet (5 mg total) by mouth every 6 (six) hours as needed. 30 tablet 1  . fish oil-omega-3 fatty acids 1000 MG capsule Take 2 g by mouth daily.      Marland Kitchen gabapentin (NEURONTIN) 300  MG capsule TAKE 1 CAPSULE AT BEDTIME 30 capsule 5  . hydrochlorothiazide (HYDRODIURIL) 25 MG tablet Take 1 tablet (25 mg total) by mouth daily. 30 tablet 5  . ibuprofen (ADVIL,MOTRIN) 600 MG tablet Take 1 tablet (600 mg total) by mouth 2 (two) times daily. 60 tablet 2  . levothyroxine (SYNTHROID, LEVOTHROID) 75 MCG tablet Take 1 tablet (75 mcg total) by mouth daily before breakfast. 30 tablet 5  . moexipril (UNIVASC) 15 MG tablet Take 1 tablet (15 mg total) by mouth at bedtime. 30 tablet 5  . rosuvastatin (CRESTOR) 10 MG tablet Take 1 tablet (10 mg total) by mouth daily. 30 tablet 5  . donepezil (ARICEPT) 5 MG tablet Take 1 tablet (5 mg total) by mouth at  bedtime. 30 tablet 3   No current facility-administered medications for this visit.    Allergies as of 10/12/2014 - Review Complete 10/12/2014  Allergen Reaction Noted  . Acid reducer [cimetidine] Nausea Only 03/28/2011  . Mevacor [lovastatin] Nausea Only 03/28/2011    Vitals: BP 181/84 mmHg  Pulse 83  Temp(Src) 97.8 F (36.6 C) (Oral)  Ht 5' 5"  (1.651 m)  Wt 157 lb (71.215 kg)  BMI 26.13 kg/m2 Last Weight:  Wt Readings from Last 1 Encounters:  10/12/14 157 lb (71.215 kg)   Last Height:   Ht Readings from Last 1 Encounters:  10/12/14 5' 5"  (1.651 m)   Physical exam: Exam: Gen: NAD, conversant, well nourised, well groomed                     CV: RRR, no MRG. No Carotid Bruits. No peripheral edema, warm, nontender Eyes: Conjunctivae clear without exudates or hemorrhage  Neuro: Detailed Neurologic Exam  Speech:    Speech is normal; fluent and spontaneous with normal comprehension.  Cognition: MoCA 24/30 and lost points for delayed recall and serial 7s.    The patient is oriented to person, place, and time;     recent and remote memory intact;     language fluent;     normal attention, concentration,     fund of knowledge Cranial Nerves:    The pupils are equal, round, and reactive to light. The fundi are normal and spontaneous venous pulsations are present. Visual fields are full to finger confrontation. Extraocular movements are intact. Trigeminal sensation is intact and the muscles of mastication are normal. The face is symmetric. The palate elevates in the midline. Voice is normal. Shoulder shrug is normal. The tongue has normal motion without fasciculations.   Coordination:    Normal finger to nose and heel to shin. Normal rapid alternating movements.   Gait:    Heel-toe and tandem gait are normal.   Motor Observation:    No asymmetry, no atrophy, and no involuntary movements noted. Tone:    Normal muscle tone.    Posture:    Posture is normal. normal  erect    Strength:    Strength is V/V in the upper and lower limbs.      Sensation: intact to LT     Reflex Exam:  DTR's:    Deep tendon reflexes in the upper and lower extremities are normal bilaterally.   Toes:    The toes are downgoing bilaterally.   Clonus:    Clonus is absent.       Assessment/Plan:  80 year old female here for mild cognitive changes. Her MoCA 24/30 with loss of points for delayed recall and serial 7s. Her sister has dementia  and she is very terrified of also developing dementia, she cries in the office today. Reassured patient. Will order an MRi of the brain, any serum dementia labs that have not been recently done. Advised that decreased sleep can cause memory problems so recommend 67m melatonin at 6pm. Continue Neurontin as needed for neuropathy. Xanax can cause drowsiness and possibly confusion so take only as needed. Good sleep hygiene. Will start Aricept 555m if tolerates can increase to 1011mFollow up in 3-6 months.  AntSarina IllD  [facility] [address] [id number] [phone] Coffin

## 2014-10-12 NOTE — Patient Instructions (Signed)
Overall you are doing fairly well but I do want to suggest a few things today:   Remember to drink plenty of fluid, eat healthy meals and do not skip any meals. Try to eat protein with a every meal and eat a healthy snack such as fruit or nuts in between meals. Try to keep a regular sleep-wake schedule and try to exercise daily, particularly in the form of walking, 20-30 minutes a day, if you can.   As far as your medications are concerned, I would like to suggest Aricept 5mg . Please call in 10 days and we can increase to 10mg  if you tolerate it well.  As far as diagnostic testing: MRi of the brain, lab tests  I would like to see you back in 3-6 months, sooner if we need to. Please call us with any interim questions, concerns, problems, updates or refill requests.   Please also call us for any test results so we can go over those with you on the phone.  My clinical assistant and will answer any of your questions and relay your messages to me and also relay most of my messages to you.   Our phone number is [phone]. We also have an after hours call service for urgent matters and there is a physician on-call for urgent questions. For any emergencies you know to call 911 or go to the nearest emergency room

## 2014-10-14 LAB — VITAMIN B1, WHOLE BLOOD: THIAMINE: 162.1 nmol/L (ref 66.5–200.0)

## 2014-10-14 LAB — B12 AND FOLATE PANEL: Vitamin B-12: 718 pg/mL (ref 211–946)

## 2014-10-18 ENCOUNTER — Other Ambulatory Visit: Payer: Self-pay | Admitting: *Deleted

## 2014-10-18 ENCOUNTER — Other Ambulatory Visit: Payer: Self-pay | Admitting: Neurology

## 2014-10-18 DIAGNOSIS — R4189 Other symptoms and signs involving cognitive functions and awareness: Secondary | ICD-10-CM

## 2014-10-18 MED ORDER — DONEPEZIL HCL 10 MG PO TABS
10.0000 mg | ORAL_TABLET | Freq: Every day | ORAL | Status: DC
Start: 2014-10-18 — End: 2014-12-17

## 2014-10-19 ENCOUNTER — Telehealth: Payer: Self-pay

## 2014-10-19 NOTE — Telephone Encounter (Signed)
-----   Message from Anson FretAntonia B Ahern, MD sent at 10/15/2014  5:44 PM EST ----- Please let patient know her labs were normal. thanks

## 2014-10-19 NOTE — Telephone Encounter (Signed)
Spoke with patient.  Gave results.

## 2014-10-26 ENCOUNTER — Ambulatory Visit (HOSPITAL_COMMUNITY)
Admission: RE | Admit: 2014-10-26 | Discharge: 2014-10-26 | Disposition: A | Discharge status: Home / Self Care | Payer: Medicare Other | Source: Ambulatory Visit | Attending: Neurology | Admitting: Neurology

## 2014-10-26 DIAGNOSIS — E785 Hyperlipidemia, unspecified: Secondary | ICD-10-CM | POA: Insufficient documentation

## 2014-10-26 DIAGNOSIS — R413 Other amnesia: Secondary | ICD-10-CM | POA: Diagnosis present

## 2014-10-26 DIAGNOSIS — G939 Disorder of brain, unspecified: Secondary | ICD-10-CM | POA: Diagnosis not present

## 2014-10-26 DIAGNOSIS — I10 Essential (primary) hypertension: Secondary | ICD-10-CM | POA: Insufficient documentation

## 2014-10-26 DIAGNOSIS — R4189 Other symptoms and signs involving cognitive functions and awareness: Secondary | ICD-10-CM

## 2014-10-27 ENCOUNTER — Telehealth: Payer: Self-pay | Admitting: Neurology

## 2014-10-27 NOTE — Telephone Encounter (Signed)
Spoke to patient and discussed unremarkable MRI brain results. No findings in the brain to cause her cognitive symptoms, no advanced atrophy.

## 2014-10-28 ENCOUNTER — Telehealth: Payer: Self-pay | Admitting: Neurology

## 2014-10-28 NOTE — Telephone Encounter (Signed)
Patient wanted to make Dr. Lucia GaskinsAhern aware that she went to bed at a later time last evening and slept like a baby.  FYI

## 2014-12-01 ENCOUNTER — Other Ambulatory Visit: Payer: Medicare Other

## 2014-12-01 ENCOUNTER — Ambulatory Visit: Payer: Medicare Other

## 2014-12-13 ENCOUNTER — Telehealth: Payer: Self-pay | Admitting: Neurology

## 2014-12-13 NOTE — Telephone Encounter (Signed)
Pt is feels real nervous and shaky on the inside and she is not sleeping like she should. She keeps waking up and it's like she is thinking all night.  She just doesn't feel right.  Please call and advise.

## 2014-12-16 NOTE — Telephone Encounter (Signed)
Called patient and left a message stating to give me a call back so I can see how she is doing. Also wanted to make patient aware that Dr. Lucia GaskinsAhern and I were out of the office and would still send her a message to let her know what is going on.

## 2014-12-17 ENCOUNTER — Other Ambulatory Visit: Payer: Self-pay | Admitting: Neurology

## 2014-12-17 DIAGNOSIS — R4189 Other symptoms and signs involving cognitive functions and awareness: Secondary | ICD-10-CM

## 2014-12-17 MED ORDER — SERTRALINE HCL 25 MG PO TABS: 25.0000 mg | ORAL_TABLET | Freq: Every day | ORAL | Status: DC

## 2014-12-17 MED ORDER — DONEPEZIL HCL 10 MG PO TABS: 10.0000 mg | ORAL_TABLET | Freq: Every day | ORAL | Status: DC

## 2014-12-17 NOTE — Telephone Encounter (Signed)
Called patient. She has a lot of anxiety and possibly some daily sadness. She would benefit from psychotherapy. Will start an SSRI at low dose. Discussed side effects of special concern in the elderly including Parkinsonism, akathisia, anorexia, sinus bradycardia, and hyponatremia. Some may cause QT prolongation, zoloft has less cardiac side effects so will start this med. Stop or call office with any significant side effects. Also discussed risk of increased suicide, stop and go directly to ED if this occurs.

## 2014-12-28 ENCOUNTER — Ambulatory Visit: Payer: Medicare Other | Admitting: Neurology

## 2015-04-12 ENCOUNTER — Ambulatory Visit (INDEPENDENT_AMBULATORY_CARE_PROVIDER_SITE_OTHER): Payer: Medicare Other | Admitting: Neurology

## 2015-04-12 ENCOUNTER — Encounter: Payer: Self-pay | Admitting: Neurology

## 2015-04-12 VITALS — BP 154/75 | HR 67 | Temp 97.0°F | Ht 63.0 in | Wt 147.5 lb

## 2015-04-12 DIAGNOSIS — G5793 Unspecified mononeuropathy of bilateral lower limbs: Secondary | ICD-10-CM

## 2015-04-12 DIAGNOSIS — R4189 Other symptoms and signs involving cognitive functions and awareness: Secondary | ICD-10-CM

## 2015-04-12 DIAGNOSIS — F411 Generalized anxiety disorder: Secondary | ICD-10-CM | POA: Diagnosis not present

## 2015-04-12 DIAGNOSIS — G5791 Unspecified mononeuropathy of right lower limb: Secondary | ICD-10-CM | POA: Diagnosis not present

## 2015-04-12 DIAGNOSIS — G4700 Insomnia, unspecified: Secondary | ICD-10-CM | POA: Diagnosis not present

## 2015-04-12 DIAGNOSIS — G5792 Unspecified mononeuropathy of left lower limb: Secondary | ICD-10-CM | POA: Diagnosis not present

## 2015-04-12 MED ORDER — DONEPEZIL HCL 10 MG PO TABS: 10.0000 mg | ORAL_TABLET | Freq: Every day | ORAL | Status: DC

## 2015-04-12 MED ORDER — DIAZEPAM 5 MG PO TABS: 5.0000 mg | ORAL_TABLET | Freq: Four times a day (QID) | ORAL | Status: DC | PRN

## 2015-04-12 MED ORDER — GABAPENTIN 300 MG PO CAPS: ORAL_CAPSULE | ORAL | Status: DC

## 2015-04-12 NOTE — Patient Instructions (Signed)
Overall you are doing fairly well but I do want to suggest a few things today:   Remember to drink plenty of fluid, eat healthy meals and do not skip any meals. Try to eat protein with a every meal and eat a healthy snack such as fruit or nuts in between meals. Try to keep a regular sleep-wake schedule and try to exercise daily, particularly in the form of walking, 20-30 minutes a day, if you can.   As far as your medications are concerned, I would like to suggest  Neurontin before bed as needed for restless leg or anxiety or to help sleep Diazepam for panic attacks  Melatonin up to 5mg  at 6pm for insomnia  As far as diagnostic testing:   I would like to see you back in XXXXX, sooner if we need to. Please call us with any interim questions, concerns, problems, updates or refill requests.   Please also call us for any test results so we can go over those with you on the phone.  My clinical assistant and will answer any of your questions and relay your messages to me and also relay most of my messages to you.   Our phone number is [phone]. We also have an after hours call service for urgent matters and there is a physician on-call for urgent questions. For any emergencies you know to call 911 or go to the nearest emergency room

## 2015-04-12 NOTE — Progress Notes (Signed)
[facility]    Provider:  Dr Jaynee Eagles Referring Provider: Stephens Shire, MD Primary Care Physician:  Stephens Shire, MD  CC:  Memory loss, insomnia, panic attacks  HPI:  April [name] is a 81 y.o. female here as a follow up. She is sleeping better. Memory is better. Doing well on the Aricept. Reviewed MR of the brainI images with patient. Discussed non-specific white matter changes and stressed following with pcp for management of vascular risk factors.  She has panic attacks , not often but can be disabling. Can refill her benzodiazepine for use with panic attacks sparingly. In the evenings with insomnia she can try melatonin at 6 PM, also can take a Neurontin to help with peripheral neuropathy as will make her sleepy as well. She is here with her husband of 45 years.  Reviewed notes, labs and imaging from outside physicians, which showed:  IMPRESSION: Mild small vessel disease type changes without evidence of acute infarct.  In this patient presenting with memory loss, no evidence of age advanced atrophy or hydrocephalus.   CC: Dementia  10/2014: April [name] is a 81 y.o. female here as a referral from Dr. Laurance Flatten for cognitive impairment. Husband provides additional history. Memory problems started 2 years ago. She was at a social event when she asked the same questions 2 times and didn't know it until someone told her. She couldn't remember what she was having for lunch. She doesn't forget appointments. She just forgets little things. Her husband says the other day she was trying to lock the car door but it was still running - she forgot to turn the car off. She loses her keys. Doesn't usually forget names even of people who she just met. Not getting lost. She forgot briefly who cuts her hair but then remembered. It is scaring her. Still driving. Bills are paid on time. Still cooking and cleaning and grocery shopping and independently living. It is little things  that are scaring her. No personality changes, hallucinations or delusions. Denies depression or anxiety. No snoring or apneic episodes. She couldn't sleep at all last night. Has some insomnia. Has a sister with dementia. She can't get to sleep because she worries so much, doesn't want to be like her sister. She is worrying so much that she drank some wine to try and get to sleep. No problems getting around, no tremor.no incontinence. Feels her cognitive changes are progressing.   She takes neurontin at night for peripheral neuropathy, burning in the feet, which is stable and she believes caused by statin use. She has Xanax for anxiety but she doesn't take it often. Insomnia is worsening.   Reviewed notes, labs and imaging from outside physicians, which showed: She has HLD, hypothyroid, HTN. CMP unremarkable, TSH mildly low (.302) and is followed by her pcp  Review of Systems: Patient complains of symptoms per HPI as well as the following symptoms:  Diarrhea, tremors, insomnia. No chest pain or shortness of breath.. Pertinent negatives per HPI. All others negative.   History   Social History  . Marital Status: Married    Spouse Name: N/A  . Number of Children: 2  . Years of Education: 12   Occupational History  . Not on file.   Social History Main Topics  . Smoking status: Former Research scientist (life sciences)  . Smokeless tobacco: Not on file  . Alcohol Use: No  . Drug Use: No  . Sexual Activity: Not on file   Other Topics Concern  .  Not on file   Social History Narrative   Lives at home with husband.   Caffeine use: Drinks green tea/ soda and coffee once in a while        Family History  Problem Relation Age of Onset  . Pneumonia Father   . Dementia Sister     Past Medical History  Diagnosis Date  . Hyperlipidemia   . Thyroid disease   . Arthritis   . Hypertension   . Cognitive changes     Past Surgical History  Procedure Laterality Date  . Vaginal hysterectomy    . Hand surgery        Arthritis   . Foot surgery      Arthritis     Current Outpatient Prescriptions  Medication Sig Dispense Refill  . aspirin 81 MG tablet Take 81 mg by mouth daily.      Marland Kitchen atorvastatin (LIPITOR) 20 MG tablet Take 20 mg by mouth daily.    . Cholecalciferol (VITAMIN D3) 2000 UNITS TABS Take 1 tablet by mouth daily.      Marland Kitchen donepezil (ARICEPT) 10 MG tablet Take 1 tablet (10 mg total) by mouth at bedtime. 30 tablet 11  . fish oil-omega-3 fatty acids 1000 MG capsule Take 2 g by mouth daily.      . hydrochlorothiazide (HYDRODIURIL) 25 MG tablet Take 1 tablet (25 mg total) by mouth daily. 30 tablet 5  . ibuprofen (ADVIL,MOTRIN) 600 MG tablet Take 1 tablet (600 mg total) by mouth 2 (two) times daily. 60 tablet 2  . levothyroxine (SYNTHROID, LEVOTHROID) 75 MCG tablet Take 1 tablet (75 mcg total) by mouth daily before breakfast. 30 tablet 5  . moexipril (UNIVASC) 15 MG tablet Take 1 tablet (15 mg total) by mouth at bedtime. 30 tablet 5  . sertraline (ZOLOFT) 25 MG tablet Take 1 tablet (25 mg total) by mouth daily. 30 tablet 6  . diazepam (VALIUM) 5 MG tablet Take 1 tablet (5 mg total) by mouth every 6 (six) hours as needed for anxiety. 30 tablet 1  . gabapentin (NEURONTIN) 300 MG capsule TAKE 1 CAPSULE AT BEDTIME 30 capsule 11  . rosuvastatin (CRESTOR) 10 MG tablet Take 1 tablet (10 mg total) by mouth daily. (Patient not taking: Reported on 04/12/2015) 30 tablet 5   No current facility-administered medications for this visit.    Allergies as of 04/12/2015 - Review Complete 04/12/2015  Allergen Reaction Noted  . Acid reducer [cimetidine] Nausea Only 03/28/2011  . Mevacor [lovastatin] Nausea Only 03/28/2011    Vitals: BP 154/75 mmHg  Pulse 67  Temp(Src) 97 F (36.1 C)  Ht 5' 3"  (1.6 m)  Wt 147 lb 8 oz (66.906 kg)  BMI 26.14 kg/m2 Last Weight:  Wt Readings from Last 1 Encounters:  04/12/15 147 lb 8 oz (66.906 kg)   Last Height:   Ht Readings from Last 1 Encounters:  04/12/15 5' 3"  (1.6  m)     Cognition:  The patient is oriented to person, place, and time;   recent and remote memory intact;   language fluent;   normal attention, concentration,   fund of knowledge Cranial Nerves:  The pupils are equal, round, and reactive to light. The fundi are normal and spontaneous venous pulsations are present. Visual fields are full to finger confrontation. Extraocular movements are intact. Trigeminal sensation is intact and the muscles of mastication are normal. The face is symmetric. The palate elevates in the midline. Voice is normal. Shoulder shrug is normal. The tongue  has normal motion without fasciculations.   Coordination:  Normal finger to nose and heel to shin. Normal rapid alternating movements.   Gait:  Heel-toe and tandem gait are normal.   Motor Observation:  No asymmetry, no atrophy, and no involuntary movements noted. Tone:  Normal muscle tone.   Posture:  Posture is normal. normal erect   Strength:  Strength is V/V in the upper and lower limbs.    Sensation: intact to LT   Reflex Exam:  DTR's:  Deep tendon reflexes in the upper and lower extremities are normal bilaterally.  Toes:  The toes are downgoing bilaterally.  Clonus:  Clonus is absent.      Assessment/Plan: 81 year old female here for mild cognitive changes. Her MoCA 24/30 last November with loss of points for delayed recall and serial 7s.  MRI of the brain showed some small vessel ischemic disease.   - follow-up with primary care 4 management of vascular risk factors. Daily aspirin for stroke prevention.  -Good sleep hygiene. , For insomnia recommended biofeedback, can try melatonin at 6 PM. If she takes Neurontin for neuropathy at night this can also help with sleep.  -For panic attacks , will refill diazepam. Use sparingly only with panic attacks  -will refill Aricept for cognitive decline. Sarina Ill, MD  [facility] [address] [place], [place] [address]  Phone [phone] Fax [phone]  A total of 30 minutes was spent face-to-face with this patient. Over half this time was spent on counseling patient on the memory loss, insomnia diagnosis and different diagnostic and therapeutic options available.

## 2015-07-12 ENCOUNTER — Other Ambulatory Visit: Payer: Self-pay | Admitting: Neurology

## 2015-07-12 NOTE — Telephone Encounter (Signed)
Per note from 01/04 

## 2015-11-14 ENCOUNTER — Other Ambulatory Visit: Payer: Self-pay | Admitting: Neurology

## 2015-11-14 NOTE — Telephone Encounter (Signed)
Per note from 01/04

## 2016-03-13 ENCOUNTER — Other Ambulatory Visit: Payer: Self-pay | Admitting: Neurology

## 2016-04-11 ENCOUNTER — Ambulatory Visit (INDEPENDENT_AMBULATORY_CARE_PROVIDER_SITE_OTHER): Payer: Medicare Other | Admitting: Nurse Practitioner

## 2016-04-11 ENCOUNTER — Telehealth: Payer: Self-pay | Admitting: *Deleted

## 2016-04-11 ENCOUNTER — Encounter: Payer: Self-pay | Admitting: Nurse Practitioner

## 2016-04-11 ENCOUNTER — Ambulatory Visit: Payer: Medicare Other | Admitting: Neurology

## 2016-04-11 VITALS — BP 142/73 | HR 60 | Ht 63.0 in | Wt 160.6 lb

## 2016-04-11 DIAGNOSIS — R4189 Other symptoms and signs involving cognitive functions and awareness: Secondary | ICD-10-CM

## 2016-04-11 DIAGNOSIS — G3184 Mild cognitive impairment, so stated: Secondary | ICD-10-CM

## 2016-04-11 DIAGNOSIS — F411 Generalized anxiety disorder: Secondary | ICD-10-CM

## 2016-04-11 DIAGNOSIS — G5793 Unspecified mononeuropathy of bilateral lower limbs: Secondary | ICD-10-CM

## 2016-04-11 MED ORDER — SERTRALINE HCL 50 MG PO TABS: 50.0000 mg | ORAL_TABLET | Freq: Every day | ORAL | Status: DC

## 2016-04-11 MED ORDER — GABAPENTIN 300 MG PO CAPS: ORAL_CAPSULE | ORAL | Status: DC

## 2016-04-11 MED ORDER — DONEPEZIL HCL 10 MG PO TABS: 10.0000 mg | ORAL_TABLET | Freq: Every day | ORAL | Status: DC

## 2016-04-11 MED ORDER — DIAZEPAM 5 MG PO TABS: 5.0000 mg | ORAL_TABLET | Freq: Four times a day (QID) | ORAL | Status: DC | PRN

## 2016-04-11 NOTE — Telephone Encounter (Signed)
Called pt per Dr Ahern/Carolyn Judie PetitM, NP request. Asked pt if ok for her to see Wynelle Clevelandarolyn M, NP instead at 3pm. Dr Lucia GaskinsAhern can speak to her after if needed. Eber JonesCarolyn will do exam. Pt okay with this. Advised her to check in at 245pm. Pt verbalized understanding.

## 2016-04-11 NOTE — Progress Notes (Addendum)
[facility]  PATIENT: April [name] DOB: 17-Dec-1931   REASON FOR VISIT: Follow-up for neuropathy of both feet, mild cognitive impairment , anxiety disorder, insomnia HISTORY FROM: Patient and husband    HISTORY OF PRESENT ILLNESS:5/3/17CM April [name], 82 year old female returns for follow-up. She was last seen in the office 04/12/2015 by Dr. Lucia Gaskins. She has a history of mild cognitive impairment neuropathy of both feet, insomnia and anxiety disorder. She is currently on Aricept 10 mg daily and she in the husband thinks she is doing much better than when last seen. She continues to take Neurontin for her peripheral neuropathy which also helps her to sleep better. She takes Valium when necessary for her anxiety and panic attacks. She is on low-dose Zoloft 25 mg daily. Appetite is good and she is sleeping well. Last B12 level was 718. She is taking vitamin D daily. She returns for reevaluation. Safety in the home reviewed  04/12/15 Dr AhernGracie Pincus [name] is a 82 y.o. female here as a follow up. She is sleeping better. Memory is better. Doing well on the Aricept. Reviewed MR of the brainI images with patient. Discussed non-specific white matter changes and stressed following with pcp for management of vascular risk factors. She has panic attacks , not often but can be disabling. Can refill her benzodiazepine for use with panic attacks sparingly. In the evenings with insomnia she can try melatonin at 6 PM, also can take a Neurontin to help with peripheral neuropathy as will make her sleepy as well. She is here with her husband of 60 years.   REVIEW OF SYSTEMS: Full 14 system review of systems performed and notable only for those listed, all others are neg:  Constitutional: neg  Cardiovascular: neg Ear/Nose/Throat: neg  Skin: neg Eyes: neg Respiratory: neg Gastroitestinal: neg  Hematology/Lymphatic: neg  Endocrine: neg Musculoskeletal: Muscle cramps Allergy/Immunology:  neg Neurological: Memory loss Psychiatric: neg Sleep : neg   ALLERGIES: Allergies  Allergen Reactions  . Acid Reducer [Cimetidine] Nausea Only  . Mevacor [Lovastatin] Nausea Only    HOME MEDICATIONS: Outpatient Prescriptions Prior to Visit  Medication Sig Dispense Refill  . aspirin 81 MG tablet Take 81 mg by mouth daily.      Marland Kitchen atorvastatin (LIPITOR) 20 MG tablet Take 20 mg by mouth daily.    . Cholecalciferol (VITAMIN D3) 2000 UNITS TABS Take 1 tablet by mouth daily.      . diazepam (VALIUM) 5 MG tablet Take 1 tablet (5 mg total) by mouth every 6 (six) hours as needed for anxiety. 30 tablet 1  . donepezil (ARICEPT) 10 MG tablet Take 1 tablet (10 mg total) by mouth at bedtime. 30 tablet 11  . fish oil-omega-3 fatty acids 1000 MG capsule Take 2 g by mouth daily.      Marland Kitchen gabapentin (NEURONTIN) 300 MG capsule TAKE 1 CAPSULE AT BEDTIME 30 capsule 11  . hydrochlorothiazide (HYDRODIURIL) 25 MG tablet Take 1 tablet (25 mg total) by mouth daily. 30 tablet 5  . ibuprofen (ADVIL,MOTRIN) 600 MG tablet Take 1 tablet (600 mg total) by mouth 2 (two) times daily. 60 tablet 2  . levothyroxine (SYNTHROID, LEVOTHROID) 75 MCG tablet Take 1 tablet (75 mcg total) by mouth daily before breakfast. 30 tablet 5  . moexipril (UNIVASC) 15 MG tablet Take 1 tablet (15 mg total) by mouth at bedtime. 30 tablet 5  . rosuvastatin (CRESTOR) 10 MG tablet Take 1 tablet (10 mg total) by mouth daily. 30 tablet 5  . sertraline (  ZOLOFT) 25 MG tablet TAKE 1 TABLET DAILY 30 tablet 0   No facility-administered medications prior to visit.    PAST MEDICAL HISTORY: Past Medical History  Diagnosis Date  . Hyperlipidemia   . Thyroid disease   . Arthritis   . Hypertension   . Cognitive changes     PAST SURGICAL HISTORY: Past Surgical History  Procedure Laterality Date  . Vaginal hysterectomy    . Hand surgery      Arthritis   . Foot surgery      Arthritis     FAMILY HISTORY: Family History  Problem Relation Age  of Onset  . Pneumonia Father   . Dementia Sister     SOCIAL HISTORY: Social History   Social History  . Marital Status: Married    Spouse Name: N/A  . Number of Children: 2  . Years of Education: 12   Occupational History  . Not on file.   Social History Main Topics  . Smoking status: Former Games developermoker  . Smokeless tobacco: Not on file  . Alcohol Use: No  . Drug Use: No  . Sexual Activity: Not on file   Other Topics Concern  . Not on file   Social History Narrative   Lives at home with husband.   Caffeine use: Drinks green tea/ soda and coffee once in a while         PHYSICAL EXAM  Filed Vitals:   04/11/16 1449  BP: 142/73  Pulse: 60  Height: 5\' 3"  (1.6 m)  Weight: 160 lb 9.6 oz (72.848 kg)   Body mass index is 28.46 kg/(m^2).  Generalized: Well developed, in no acute distress , well groomed Head: normocephalic and atraumatic,. Oropharynx benign  Neck: Supple, no carotid bruits  Cardiac: Regular rate rhythm, no murmur  Musculoskeletal: No deformity   Neurological examination   Mentation: Alert oriented to time, place, history taking. Attention span and concentration appropriate. MOCA 16/30.   Follows all commands speech and language fluent.  Montreal Cognitive Assessment  04/11/2016  Visuospatial/ Executive (0/5) 5  Naming (0/3) 2  Attention: Read list of digits (0/2) 1  Attention: Read list of letters (0/1) 0  Attention: Serial 7 subtraction starting at 100 (0/3) 0  Language: Repeat phrase (0/2) 2  Language : Fluency (0/1) 0  Abstraction (0/2) 1  Delayed Recall (0/5) 0  Orientation (0/6) 5  Total 16     Cranial nerve II-XII: Pupils were equal round reactive to light extraocular movements were full, visual field were full on confrontational test. Facial sensation and strength were normal. hearing was intact to finger rubbing bilaterally. Uvula tongue midline. head turning and shoulder shrug were normal and symmetric.Tongue protrusion into cheek strength  was normal. Motor: normal bulk and tone, full strength in the BUE, BLE, fine finger movements normal, no pronator drift. No focal weakness Sensory: normal and symmetric to light touch,  Coordination: finger-nose-finger, heel-to-shin bilaterally, no dysmetria Reflexes: Brachioradialis 2/2, biceps 2/2, triceps 2/2, patellar 2/2, Achilles 2/2, plantar responses were flexor bilaterally. Gait and Station: Rising up from seated position without assistance, normal stance,  moderate stride, good arm swing, smooth turning, able to perform tiptoe, and heel walking without difficulty. Tandem gait is steady  DIAGNOSTIC DATA (LABS, IMAGING, TESTING) -  ASSESSMENT AND PLAN  82 y.o. year old female  has a past medical history of Hyperlipidemia; Thyroid disease; Arthritis; Hypertension; and Cognitive changes. here to follow-up.MRI of the brain showed some small vessel ischemic disease.  Seen with Dr.  Ahern Daily aspirin for stroke prevention. Good sleep hygiene. , Neurontin for neuropathy at night this can also help with sleep. For panic attacks , will refill diazepam. Use sparingly only with panic attacks Will refill Aricept for cognitive disorder Increase Zoloft to 50 mg daily Follow up in 6 MONTHS WITH DR. Frances Furbish Vst time 25 min Nilda Riggs, [facility], [facility], APRN  [facility] [address] [place], [place] [address] [phone]   Personally participated in and made any corrections needed to history, physical, neuro exam,assessment and plan as stated above, evaluated lab date, reviewed imaging studies and agree with radiology interpretations.    Naomie Dean, MD [facility]

## 2016-04-11 NOTE — Patient Instructions (Signed)
Continue Aricept at current dose will refill Continue Gabapentin for peripheral neuropathy will renew Continue Zoloft and increase to 50mg   Continue Valium will renew F/U in 6 months with Dr. Lucia GaskinsAhern

## 2016-10-15 ENCOUNTER — Encounter: Payer: Self-pay | Admitting: Neurology

## 2016-10-15 ENCOUNTER — Ambulatory Visit (INDEPENDENT_AMBULATORY_CARE_PROVIDER_SITE_OTHER): Payer: Medicare Other | Admitting: Neurology

## 2016-10-15 DIAGNOSIS — R4189 Other symptoms and signs involving cognitive functions and awareness: Secondary | ICD-10-CM

## 2016-10-15 MED ORDER — DONEPEZIL HCL 10 MG PO TABS: 10.0000 mg | ORAL_TABLET | Freq: Every day | ORAL | 5 refills | Status: DC

## 2016-10-15 NOTE — Progress Notes (Signed)
[facility]    Provider:  Dr Jaynee Eagles Referring Provider: Stephens Shire, MD Primary Care Physician:  Stephens Shire, MD  CC:  Memory loss, insomnia, panic attacks  10/15/2016: April [name] is an 82 year old female here for follow up of mild cognitive impairment whose Berkey remains mildy worsened  20/30 today, first test we did in 10/2014 was a 24/30. She doesn't notice any changes. She forgets some dates and some names of someone new but overall no changes. No mor panic attacks, she has some neuropathy in the feet, she carries valium with her just in case. She does not sleep enough, she goes to bed at 11/12 and can;t get to sleep for a few hours. Some nights she sleeps all night. She is not taking the neurontin anymore at night, she says she worries it is associated with dementia. They have been married 60+ years.   05/08/2016: HISTORY OF PRESENT ILLNESS:5/3/17CM April [name], 82 year old female returns for follow-up. She was last seen in the office 04/12/2015 by Dr. Jaynee Eagles. She has a history of mild cognitive impairment neuropathy of both feet, insomnia and anxiety disorder. She is currently on Aricept 10 mg daily and she in the husband thinks she is doing much better than when last seen. She continues to take Neurontin for her peripheral neuropathy which also helps her to sleep better. She takes Valium when necessary for her anxiety and panic attacks. She is on low-dose Zoloft 25 mg daily. Appetite is good and she is sleeping well. Last B12 level was 718. She is taking vitamin D daily. She returns for reevaluation. Safety in the home reviewed  HPI:  April [name] is a 82 y.o. female here as a follow up. She is sleeping better. Memory is better. Doing well on the Aricept. Reviewed MR of the brainI images with patient. Discussed non-specific white matter changes and stressed following with pcp for management of vascular risk factors.  She has panic attacks , not often but can be disabling.  Can refill her benzodiazepine for use with panic attacks sparingly. In the evenings with insomnia she can try melatonin at 6 PM, also can take a Neurontin to help with peripheral neuropathy as will make her sleepy as well. She is here with her husband of 65 years.  Reviewed notes, labs and imaging from outside physicians, which showed:  IMPRESSION: Mild small vessel disease type changes without evidence of acute infarct.  In this patient presenting with memory loss, no evidence of age advanced atrophy or hydrocephalus.   10/2014: April [name] is a 82 y.o. female here as a referral from Dr. Laurance Flatten for cognitive impairment. Husband provides additional history. Memory problems started 2 years ago. She was at a social event when she asked the same questions 2 times and didn't know it until someone told her. She couldn't remember what she was having for lunch. She doesn't forget appointments. She just forgets little things. Her husband says the other day she was trying to lock the car door but it was still running - she forgot to turn the car off. She loses her keys. Doesn't usually forget names even of people who she just met. Not getting lost. She forgot briefly who cuts her hair but then remembered. It is scaring her. Still driving. Bills are paid on time. Still cooking and cleaning and grocery shopping and independently living. It is little things that are scaring her. No personality changes, hallucinations or delusions. Denies depression or anxiety. No  snoring or apneic episodes. She couldn't sleep at all last night. Has some insomnia. Has a sister with dementia. She can't get to sleep because she worries so much, doesn't want to be like her sister. She is worrying so much that she drank some wine to try and get to sleep. No problems getting around, no tremor.no incontinence. Feels her cognitive changes are progressing.   She takes neurontin at night for peripheral neuropathy, burning in the  feet, which is stable and she believes caused by statin use. She has Xanax for anxiety but she doesn't take it often. Insomnia is worsening.   Reviewed notes, labs and imaging from outside physicians, which showed: She has HLD, hypothyroid, HTN. CMP unremarkable, TSH mildly low (.302) and is followed by her pcp  Review of Systems: Patient complains of symptoms per HPI as well as the following symptoms:  Diarrhea, tremors, insomnia. No chest pain or shortness of breath.. Pertinent negatives per HPI. All others negative.    Social History   Social History  . Marital status: Married    Spouse name: N/A  . Number of children: 2  . Years of education: 12   Occupational History  . Not on file.   Social History Main Topics  . Smoking status: Former Research scientist (life sciences)  . Smokeless tobacco: Never Used  . Alcohol use 0.6 oz/week    1 Glasses of wine per week     Comment: socially  . Drug use: No  . Sexual activity: Not on file   Other Topics Concern  . Not on file   Social History Narrative   Lives at home with husband.   Caffeine use: Drinks green tea/ soda and coffee once in a while        Family History  Problem Relation Age of Onset  . Pneumonia Father   . Dementia Sister     Past Medical History:  Diagnosis Date  . Arthritis   . Cognitive changes   . Hyperlipidemia   . Hypertension   . Thyroid disease     Past Surgical History:  Procedure Laterality Date  . FOOT SURGERY     Arthritis   . HAND SURGERY     Arthritis   . VAGINAL HYSTERECTOMY      Current Outpatient Prescriptions  Medication Sig Dispense Refill  . aspirin 81 MG tablet Take 81 mg by mouth daily.      Marland Kitchen atorvastatin (LIPITOR) 20 MG tablet Take 20 mg by mouth daily.    . Cholecalciferol (VITAMIN D3) 2000 UNITS TABS Take 1 tablet by mouth daily.      . diazepam (VALIUM) 5 MG tablet Take 1 tablet (5 mg total) by mouth every 6 (six) hours as needed for anxiety. 30 tablet 1  . donepezil (ARICEPT) 10 MG  tablet Take 1 tablet (10 mg total) by mouth at bedtime. 30 tablet 6  . fish oil-omega-3 fatty acids 1000 MG capsule Take 2 g by mouth daily.      . hydrochlorothiazide (HYDRODIURIL) 25 MG tablet Take 1 tablet (25 mg total) by mouth daily. 30 tablet 5  . ibuprofen (ADVIL,MOTRIN) 600 MG tablet Take 1 tablet (600 mg total) by mouth 2 (two) times daily. 60 tablet 2  . levothyroxine (SYNTHROID, LEVOTHROID) 75 MCG tablet Take 1 tablet (75 mcg total) by mouth daily before breakfast. 30 tablet 5  . moexipril (UNIVASC) 15 MG tablet Take 1 tablet (15 mg total) by mouth at bedtime. 30 tablet 5  . sertraline (ZOLOFT)  50 MG tablet Take 1 tablet (50 mg total) by mouth daily. 30 tablet 6   No current facility-administered medications for this visit.     Allergies as of 10/15/2016 - Review Complete 10/15/2016  Allergen Reaction Noted  . Acid reducer [cimetidine] Nausea Only 03/28/2011  . Mevacor [lovastatin] Nausea Only 03/28/2011    Vitals: BP (!) 150/78 (BP Location: Right Arm, Patient Position: Sitting, Cuff Size: Small)   Pulse 61   Wt 162 lb 6.4 oz (73.7 kg)   BMI 28.77 kg/m  Last Weight:  Wt Readings from Last 1 Encounters:  10/15/16 162 lb 6.4 oz (73.7 kg)   Last Height:   Ht Readings from Last 1 Encounters:  04/11/16 _0  (1.6 m)    Montreal Cognitive Assessment  04/11/2016  Visuospatial/ Executive (0/5) 5  Naming (0/3) 2  Attention: Read list of digits (0/2) 1  Attention: Read list of letters (0/1) 0  Attention: Serial 7 subtraction starting at 100 (0/3) 0  Language: Repeat phrase (0/2) 2  Language : Fluency (0/1) 0  Abstraction (0/2) 1  Delayed Recall (0/5) 0  Orientation (0/6) 5  Total 16   Cognition:  The patient is oriented to person, place, and time;   recent and remote memory intact;   language fluent;   normal attention, concentration,   fund of knowledge Cranial Nerves:  The pupils are equal, round, and reactive to light. The fundi are normal and  spontaneous venous pulsations are present. Visual fields are full to finger confrontation. Extraocular movements are intact. Trigeminal sensation is intact and the muscles of mastication are normal. The face is symmetric. The palate elevates in the midline. Voice is normal. Shoulder shrug is normal. The tongue has normal motion without fasciculations.   Coordination:  Normal finger to nose and heel to shin. Normal rapid alternating movements.   Gait:  Heel-toe and tandem gait are normal.   Motor Observation:  No asymmetry, no atrophy, and no involuntary movements noted. Tone:  Normal muscle tone.   Posture:  Posture is normal. normal erect   Strength:  Strength is V/V in the upper and lower limbs.    Sensation: intact to LT   Reflex Exam:  DTR's:  Deep tendon reflexes in the upper and lower extremities are normal bilaterally.  Toes:  The toes are downgoing bilaterally.  Clonus:  Clonus is absent.   Assessment/Plan: 82 year old female here for mild cognitive changes. Initial MoCA 24/30 today 20/30 over 2 years. MRI of the brain 2015 showed some small vessel ischemic disease. B12 normal.   - follow-up with primary care for management of vascular risk factors. Daily aspirin for stroke prevention.  -Good sleep hygiene. , For insomnia recommended biofeedback, can try melatonin at 6 PM. If she takes Neurontin for neuropathy at night this can also help with sleep.  -For panic attacks ,  Diazepam prn. Use sparingly only with panic attacks  -will refill Aricept for cognitive decline. - consider Namenda if MoCA and clinical pictur worsens - B12 in the past normal.  - f/u 9 months  Sarina Ill, MD  [facility] [address] [place], [place] [address]  Phone [phone] Fax [phone]  A total of 30 minutes was spent face-to-face with this patient. Over half this time was spent on counseling  patient on the memory loss, insomnia diagnosis and different diagnostic and therapeutic options available.

## 2016-11-06 ENCOUNTER — Other Ambulatory Visit: Payer: Self-pay | Admitting: Nurse Practitioner

## 2017-04-08 ENCOUNTER — Other Ambulatory Visit: Payer: Self-pay | Admitting: Physician Assistant

## 2017-04-08 DIAGNOSIS — R0989 Other specified symptoms and signs involving the circulatory and respiratory systems: Secondary | ICD-10-CM

## 2017-04-16 ENCOUNTER — Other Ambulatory Visit: Payer: Self-pay

## 2017-06-10 ENCOUNTER — Other Ambulatory Visit: Payer: Self-pay | Admitting: Nurse Practitioner

## 2017-08-14 ENCOUNTER — Encounter: Payer: Self-pay | Admitting: Neurology

## 2017-08-14 ENCOUNTER — Ambulatory Visit (INDEPENDENT_AMBULATORY_CARE_PROVIDER_SITE_OTHER): Payer: Medicare Other | Admitting: Neurology

## 2017-08-14 DIAGNOSIS — R4189 Other symptoms and signs involving cognitive functions and awareness: Secondary | ICD-10-CM

## 2017-08-14 MED ORDER — DONEPEZIL HCL 10 MG PO TABS
10.0000 mg | ORAL_TABLET | Freq: Every day | ORAL | 5 refills | Status: DC
Start: 2017-08-14 — End: 2019-10-21

## 2017-08-14 NOTE — Progress Notes (Signed)
[facility]    Provider:  Dr April [name] Referring Provider: Stephens Shire, MD Primary Care Physician:  Stephens Shire, MD  CC: Memory loss, insomnia, panic attacks  Interval history 08/14/2017: She feels great, memory is great, no changes, no more panic attacks, she decreased wine and only takes a sip once in a while. She lives independently [place] her husband, she maintains all the finances accurately, not missing bills, no accidents in the car or accidents in the home, she has fallen once after missing a step, she says she was embarassed more than anything, her vision is good, no falls since since the spring and she has no fall history it was a mechanical fall her balance is fine and she feels no imbalance. Mood is fine, she takes melatonin at night to help her sleep. Anxiety stable. Not getting lost.   10/15/2016: April [name] is an 83 year old female here for follow up of mild cognitive impairment whose MOCA remains mildy worsened  20/30 today, first test we did in 10/2014 was a 24/30. She doesn't notice any changes. She forgets some dates and some names of someone new but overall no changes. No mor panic attacks, she has some neuropathy in the feet, she carries valium with her just in case. She does not sleep enough, she goes to bed at 11/12 and can;t get to sleep for a few hours. Some nights she sleeps all night. She is not taking the neurontin anymore at night, she says she worries it is associated with dementia. They have been married 60+ years.   05/08/2016: HISTORY OF PRESENT ILLNESS:5/3/17CMMs. [name], 83 year old female returns for follow-up. She was last seen in the office 04/12/2015 by Dr. Jaynee [name]. She has a history of mild cognitive impairment neuropathy of both feet, insomnia and anxiety disorder. She is currently on Aricept 10 mg daily and she in the husband thinks she is doing much better than when last seen. She continues to take Neurontin for her peripheral neuropathy  which also helps her to sleep better. She takes Valium when necessary for her anxiety and panic attacks. She is on low-dose Zoloft 25 mg daily. Appetite is good and she is sleeping well. Last B12 level was 718. She is taking vitamin D daily. She returns for reevaluation. Safety in the home reviewed  BZJ:IRCVEL April Mabeis a 83 y.o.femalehere as a follow up. She is sleeping better. Memory is better. Doing well on the Aricept. Reviewed MR of the brainI images with patient. Discussed non-specific white matter changes and stressed following with pcp for management of vascular risk factors. She has panic attacks , not often but can be disabling. Can refill her benzodiazepine for use with panic attacks sparingly. In the evenings with insomnia she can try melatonin at 6 PM, also can take a Neurontin to help with peripheral neuropathy as will make her sleepy as well. She is here with her husband of 4 years.  Reviewed notes, labs and imaging from outside physicians, which showed:  IMPRESSION: Mild small vessel disease type changes without evidence of acute infarct.  In this patient presenting with memory loss, no evidence of age advanced atrophy or hydrocephalus.   10/2014: April [name] is a 83 y.o. female here as a referral from Dr. Laurance [name] for cognitive impairment. Husband provides additional history. Memory problems started 2 years ago. She was at a social event when she asked the same questions 2 times and didn't know it until someone told her. She couldn't remember  what she was having for lunch. She doesn't forget appointments. She just forgets little things. Her husband says the other day she was trying to lock the car door but it was still running - she forgot to turn the car off. She loses her keys. Doesn't usually forget names even of people who she just met. Not getting lost. She forgot briefly who cuts her hair but then remembered. It is scaring her. Still driving. Bills are paid on time.  Still cooking and cleaning and grocery shopping and independently living. It is little things that are scaring her. No personality changes, hallucinations or delusions. Denies depression or anxiety. No snoring or apneic episodes. She couldn't sleep at all last night. Has some insomnia. Has a sister with dementia. She can't get to sleep because she worries so much, doesn't want to be like her sister. She is worrying so much that she drank some wine to try and get to sleep. No problems getting around, no tremor.no incontinence. Feels her cognitive changes are progressing.   She takes neurontin at night for peripheral neuropathy, burning in the feet, which is stable and she believes caused by statin use. She has Xanax for anxiety but she doesn't take it often. Insomnia is worsening.   Reviewed notes, labs and imaging from outside physicians, which showed: She has HLD, hypothyroid, HTN. CMP unremarkable, TSH mildly low (.302) and is followed by her pcp  Review of Systems: Patient complains of symptoms per HPI as well as the following symptoms: Diarrhea, tremors, insomnia. No chest pain or shortness of breath.. Pertinent negatives per HPI. All others negative.     Social History   Social History  . Marital status: Married    Spouse name: N/A  . Number of children: 2  . Years of education: 12   Occupational History  . Not on file.   Social History Main Topics  . Smoking status: Former Research scientist (life sciences)  . Smokeless tobacco: Never Used  . Alcohol use No     Comment: socially  . Drug use: No  . Sexual activity: Not on file   Other Topics Concern  . Not on file   Social History Narrative   Lives at home with husband   Right-handed   Caffeine use: Drinks green tea/ soda and coffee once in a while        Family History  Problem Relation Age of Onset  . Pneumonia Father   . Dementia Sister     Past Medical History:  Diagnosis Date  . Arthritis   . Cognitive changes   .  Hyperlipidemia   . Hypertension   . Thyroid disease     Past Surgical History:  Procedure Laterality Date  . FOOT SURGERY     Arthritis   . HAND SURGERY     Arthritis   . VAGINAL HYSTERECTOMY      Current Outpatient Prescriptions  Medication Sig Dispense Refill  . aspirin 81 MG tablet Take 81 mg by mouth daily.      Marland Kitchen atorvastatin (LIPITOR) 20 MG tablet Take 20 mg by mouth daily.    . Cholecalciferol (VITAMIN D3) 2000 UNITS TABS Take 1 tablet by mouth daily.      . diazepam (VALIUM) 5 MG tablet Take 1 tablet (5 mg total) by mouth every 6 (six) hours as needed for anxiety. 30 tablet 1  . donepezil (ARICEPT) 10 MG tablet Take 1 tablet (10 mg total) by mouth at bedtime. 90 tablet 5  . fish  oil-omega-3 fatty acids 1000 MG capsule Take 2 g by mouth daily.      . hydrochlorothiazide (HYDRODIURIL) 25 MG tablet Take 1 tablet (25 mg total) by mouth daily. 30 tablet 5  . ibuprofen (ADVIL,MOTRIN) 600 MG tablet Take 1 tablet (600 mg total) by mouth 2 (two) times daily. 60 tablet 2  . levothyroxine (SYNTHROID, LEVOTHROID) 75 MCG tablet Take 1 tablet (75 mcg total) by mouth daily before breakfast. 30 tablet 5  . moexipril (UNIVASC) 15 MG tablet Take 1 tablet (15 mg total) by mouth at bedtime. 30 tablet 5  . sertraline (ZOLOFT) 50 MG tablet TAKE 1 TABLET DAILY 30 tablet 2   No current facility-administered medications for this visit.     Allergies as of 08/14/2017 - Review Complete 08/14/2017  Allergen Reaction Noted  . Acid reducer [cimetidine] Nausea Only 03/28/2011  . Mevacor [lovastatin] Nausea Only 03/28/2011    Vitals: BP 124/60   Ht _0  (1.6 April)   Wt 161 lb 3.2 oz (73.1 kg)   BMI 28.56 kg/April  Last Weight:  Wt Readings from Last 1 Encounters:  08/14/17 161 lb 3.2 oz (73.1 kg)   Last Height:   Ht Readings from Last 1 Encounters:  08/14/17 _1  (1.6 April)     Montreal Cognitive Assessment  10/15/2016 04/11/2016  Visuospatial/ Executive (0/5) 2 5  Naming (0/3) 3 2  Attention:  Read list of digits (0/2) 2 1  Attention: Read list of letters (0/1) 1 0  Attention: Serial 7 subtraction starting at 100 (0/3) 1 0  Language: Repeat phrase (0/2) 2 2  Language : Fluency (0/1) 1 0  Abstraction (0/2) 2 1  Delayed Recall (0/5) 0 0  Orientation (0/6) 6 5  Total 20 16     No flowsheet data found. Cognition: The patient is oriented to person, place, and time;  recent and remote memory intact;  language fluent;  normal attention, concentration,  fund of knowledge Cranial Nerves: The pupils are equal, round, and reactive to light. The fundi are normal and spontaneous venous pulsations are present. Visual fields are full to finger confrontation. Extraocular movements are intact. Trigeminal sensation is intact and the muscles of mastication are normal. The face is symmetric. The palate elevates in the midline. Voice is normal. Shoulder shrug is normal. The tongue has normal motion without fasciculations.   Coordination: Normal finger to nose and heel to shin. Normal rapid alternating movements.   Gait: Heel-toe and tandem gait are normal.   Motor Observation: No asymmetry, no atrophy, and no involuntary movements noted. Tone: Normal muscle tone.  Posture: Posture is normal. normal erect  Strength: Strength is V/V in the upper and lower limbs.   Sensation: intact to LT  Reflex Exam:  DTR's: Deep tendon reflexes in the upper and lower extremities are normal bilaterally. Toes: The toes are downgoing bilaterally. Clonus: Clonus is absent.   Assessment/Plan:83 year old female here for mild cognitive changes. Initial MoCA 24/30 today 20/30 over 2 years. MRI of the brain 2015 showed some small vessel ischemic disease. B12 normal.  -follow-up with primary care for management of vascular risk factors. Daily aspirin for stroke prevention. -Good sleep hygiene. , For insomnia  recommended biofeedback, can try melatonin at 6 PM. If she takes Neurontin for neuropathy at night this can also help with sleep. -For panic attacks ,  Diazepam prn. Use sparingly only with panic attacks -will refill Aricept for cognitive decline. - consider Namenda if MoCA and clinical pictur worsens (  MMSE stable) - B12 in the past normal.  - f/u 9 months  Sarina Ill, MD  [facility] [address] [place], [place] [address]  Phone [phone] Fax [phone]  A total of 15 minutes was spent face-to-face with this patient. Over half this time was spent on counseling patient on the MCI, anxiety diagnosis and different diagnostic and therapeutic options available.

## 2017-08-14 NOTE — Patient Instructions (Signed)
Remember to drink plenty of fluid, eat healthy meals and do not skip any meals. Try to eat protein with a every meal and eat a healthy snack such as fruit or nuts in between meals. Try to keep a regular sleep-wake schedule and try to exercise daily, particularly in the form of walking, 20-30 minutes a day, if you can.   As far as your medications are concerned, I would like to suggest: continue Aricept  I would like to see you back in 1 year, sooner if we need to. Please call us with any interim questions, concerns, problems, updates or refill requests.    Our phone number is [phone]. We also have an after hours call service for urgent matters and there is a physician on-call for urgent questions. For any emergencies you know to call 911 or go to the nearest emergency room

## 2017-10-15 ENCOUNTER — Telehealth: Payer: Self-pay | Admitting: Neurology

## 2017-10-15 NOTE — Telephone Encounter (Signed)
I would encourage her to take it again, this helps with memory thanks

## 2017-10-15 NOTE — Telephone Encounter (Signed)
Patient is calling to discuss taking  donepezil (ARICEPT) 10 MG tablet. She stopped the medication at the last office visit with Dr. Lucia GaskinsAhern and wants to possibly start medication again.

## 2017-10-16 NOTE — Telephone Encounter (Signed)
Called and spoke with pt. I informed her that Dr. Lucia GaskinsAhern encourages her to resume Aricept as it helps with memory. She reported that she started taking it again last night, 10 mg daily. She also said that she is sleeping well and is not taking Zoloft. She confirmed her next appt is in December on the 19th. She verbalized understanding that she will call if she has any issues before then.

## 2017-10-25 ENCOUNTER — Other Ambulatory Visit: Payer: Self-pay | Admitting: Neurology

## 2017-11-27 ENCOUNTER — Ambulatory Visit: Payer: Medicare Other | Admitting: Neurology

## 2018-01-13 ENCOUNTER — Other Ambulatory Visit: Payer: Self-pay | Admitting: Neurology

## 2018-01-14 ENCOUNTER — Telehealth: Payer: Self-pay | Admitting: Neurology

## 2018-01-14 MED ORDER — SERTRALINE HCL 50 MG PO TABS: 50.0000 mg | ORAL_TABLET | Freq: Every day | ORAL | 1 refills | Status: DC

## 2018-01-14 MED ORDER — SERTRALINE HCL 50 MG PO TABS: 50.0000 mg | ORAL_TABLET | Freq: Every day | ORAL | 11 refills | Status: DC

## 2018-01-14 NOTE — Telephone Encounter (Signed)
Called patient and confirmed she has been taking Zoloft 50 mg and is doing well. Refill escribed with 1 refill.   Spoke with Dr. Lucia GaskinsAhern, she authorized a refill of Zoloft for a year and stated that pt does not need to f/u with her anymore since she is doing well and she can see PCP for further refills.   Called patient back and let her know that per Dr. Lucia GaskinsAhern, she does not have to come back and she can get further refills through PCP. We have also sent Zoloft to her pharmacy for a year. Encouraged patient to call if needed and she verbalized understanding and appreciation.

## 2018-01-14 NOTE — Telephone Encounter (Signed)
Pt called she is needing refill for sertraline (ZOLOFT) 50 MG tablet. Pt said she started taking it in November 2018. Pt said she feels better when taking it. Pt has 1 tab left for tomorrow. [facility]

## 2019-06-25 ENCOUNTER — Ambulatory Visit: Payer: Self-pay | Admitting: Cardiology

## 2019-09-09 ENCOUNTER — Telehealth: Payer: Self-pay | Admitting: Family Medicine

## 2019-09-09 ENCOUNTER — Other Ambulatory Visit: Payer: Self-pay | Admitting: Family Medicine

## 2019-09-09 MED ORDER — SERTRALINE HCL 50 MG PO TABS: 50.0000 mg | ORAL_TABLET | Freq: Every day | ORAL | 0 refills | Status: DC

## 2019-09-09 NOTE — Telephone Encounter (Signed)
Please contact the patient let her know I sent in sertraline WS

## 2019-09-09 NOTE — Telephone Encounter (Signed)
Patient aware.

## 2019-09-16 ENCOUNTER — Ambulatory Visit: Payer: Medicare Other | Admitting: Family Medicine

## 2019-10-20 ENCOUNTER — Other Ambulatory Visit: Payer: Self-pay

## 2019-10-21 ENCOUNTER — Ambulatory Visit (INDEPENDENT_AMBULATORY_CARE_PROVIDER_SITE_OTHER): Payer: Medicare Other | Admitting: Family Medicine

## 2019-10-21 ENCOUNTER — Encounter: Payer: Self-pay | Admitting: Family Medicine

## 2019-10-21 VITALS — BP 136/79 | HR 73 | Temp 97.5°F | Ht 63.0 in | Wt 157.6 lb

## 2019-10-21 DIAGNOSIS — E785 Hyperlipidemia, unspecified: Secondary | ICD-10-CM

## 2019-10-21 DIAGNOSIS — G3184 Mild cognitive impairment, so stated: Secondary | ICD-10-CM | POA: Diagnosis not present

## 2019-10-21 DIAGNOSIS — I10 Essential (primary) hypertension: Secondary | ICD-10-CM | POA: Diagnosis not present

## 2019-10-21 DIAGNOSIS — E034 Atrophy of thyroid (acquired): Secondary | ICD-10-CM

## 2019-10-21 DIAGNOSIS — Z23 Encounter for immunization: Secondary | ICD-10-CM

## 2019-10-21 DIAGNOSIS — E039 Hypothyroidism, unspecified: Secondary | ICD-10-CM | POA: Diagnosis not present

## 2019-10-21 DIAGNOSIS — R4189 Other symptoms and signs involving cognitive functions and awareness: Secondary | ICD-10-CM

## 2019-10-21 MED ORDER — MOEXIPRIL HCL 15 MG PO TABS: 15.0000 mg | ORAL_TABLET | Freq: Every day | ORAL | 5 refills | Status: DC

## 2019-10-21 MED ORDER — LEVOTHYROXINE SODIUM 75 MCG PO TABS: 75.0000 ug | ORAL_TABLET | Freq: Every day | ORAL | 5 refills | Status: DC

## 2019-10-21 MED ORDER — SERTRALINE HCL 50 MG PO TABS: 50.0000 mg | ORAL_TABLET | Freq: Every day | ORAL | 0 refills | Status: DC

## 2019-10-21 MED ORDER — DONEPEZIL HCL 10 MG PO TABS: 10.0000 mg | ORAL_TABLET | Freq: Every day | ORAL | 5 refills | Status: DC

## 2019-10-21 MED ORDER — HYDROCHLOROTHIAZIDE 12.5 MG PO TABS: 12.5000 mg | ORAL_TABLET | Freq: Every day | ORAL | 1 refills | Status: DC

## 2019-10-21 NOTE — Progress Notes (Signed)
Established Patient Office Visit  Subjective:  Patient ID: April [name], female    DOB: 12-16-1931  Age: 85 y.o. MRN: [id number]  CC:  Chief Complaint  Patient presents with  . New Patient (Initial Visit)    HPI April [name] presents for new patient evaluation.  follow-up on  thyroid. The patient has a history of hypothyroidism for many years. It has been stable recently. Pt. denies any change in  voice, loss of hair, heat or cold intolerance. Energy level has been adequate to good. Patient denies constipation and diarrhea. No myxedema. Medication is as noted below. Verified that pt is taking it daily on an empty stomach. Well tolerated. Pt. used to take atorvastatin. States she is unwilling to take it any more because her cholesterol is fine.   She rarely takes a valium for severe anxiety, but takes sertraline regularly. She is anxious over her husband's illness and her son's condition. He is apparently disabled.    Past Medical History:  Diagnosis Date  . Arthritis   . Cognitive changes   . Hyperlipidemia   . Hypertension   . Thyroid disease     Past Surgical History:  Procedure Laterality Date  . FOOT SURGERY     Arthritis   . HAND SURGERY     Arthritis   . VAGINAL HYSTERECTOMY      Family History  Problem Relation Age of Onset  . Pneumonia Father   . Dementia Sister     Social History   Socioeconomic History  . Marital status: Married    Spouse name: Not on file  . Number of children: 2  . Years of education: 49  . Highest education level: Not on file  Occupational History  . Not on file  Social Needs  . Financial resource strain: Not on file  . Food insecurity    Worry: Not on file    Inability: Not on file  . Transportation needs    Medical: Not on file    Non-medical: Not on file  Tobacco Use  . Smoking status: Former Games developer  . Smokeless tobacco: Never Used  Substance and Sexual Activity  . Alcohol use: No    Alcohol/week: 1.0 standard  drinks    Types: 1 Glasses of wine per week    Comment: socially  . Drug use: No  . Sexual activity: Not on file  Lifestyle  . Physical activity    Days per week: Not on file    Minutes per session: Not on file  . Stress: Not on file  Relationships  . Social Musician on phone: Not on file    Gets together: Not on file    Attends religious service: Not on file    Active member of club or organization: Not on file    Attends meetings of clubs or organizations: Not on file    Relationship status: Not on file  . Intimate partner violence    Fear of current or ex partner: Not on file    Emotionally abused: Not on file    Physically abused: Not on file    Forced sexual activity: Not on file  Other Topics Concern  . Not on file  Social History Narrative   Lives at home with husband   Right-handed   Caffeine use: Drinks green tea/ soda and coffee once in a while     Outpatient Medications Prior to Visit  Medication Sig Dispense Refill  .  aspirin 81 MG tablet Take 81 mg by mouth daily.      . Cholecalciferol (VITAMIN D3) 2000 UNITS TABS Take 1 tablet by mouth daily.      . diazepam (VALIUM) 5 MG tablet Take 1 tablet (5 mg total) by mouth every 6 (six) hours as needed for anxiety. 30 tablet 1  . Melatonin 3 MG TABS Take by mouth.    . donepezil (ARICEPT) 10 MG tablet Take 1 tablet (10 mg total) by mouth at bedtime. 90 tablet 5  . donepezil (ARICEPT) 10 MG tablet TAKE 1 TABLET AT BEDTIME FOR MEMORY    . hydrochlorothiazide (HYDRODIURIL) 25 MG tablet Take 1 tablet (25 mg total) by mouth daily. 30 tablet 5  . levothyroxine (SYNTHROID, LEVOTHROID) 75 MCG tablet Take 1 tablet (75 mcg total) by mouth daily before breakfast. 30 tablet 5  . moexipril (UNIVASC) 15 MG tablet Take 1 tablet (15 mg total) by mouth at bedtime. 30 tablet 5  . sertraline (ZOLOFT) 50 MG tablet Take 1 tablet (50 mg total) by mouth daily. 30 tablet 0  . ibuprofen (ADVIL,MOTRIN) 600 MG tablet Take 1 tablet  (600 mg total) by mouth 2 (two) times daily. 60 tablet 2  . atorvastatin (LIPITOR) 20 MG tablet Take 20 mg by mouth daily.    . fish oil-omega-3 fatty acids 1000 MG capsule Take 2 g by mouth daily.       No facility-administered medications prior to visit.     Allergies  Allergen Reactions  . Acid Reducer [Cimetidine] Nausea Only  . Mevacor [Lovastatin] Nausea Only    ROS Review of Systems  Constitutional: Negative.   HENT: Negative for congestion.   Eyes: Negative for visual disturbance.  Respiratory: Negative for shortness of breath.   Cardiovascular: Negative for chest pain.  Gastrointestinal: Negative for abdominal pain, constipation, diarrhea, nausea and vomiting.  Genitourinary: Negative for difficulty urinating.  Musculoskeletal: Negative for arthralgias and myalgias.  Neurological: Negative for headaches.  Psychiatric/Behavioral: Negative for sleep disturbance.      Objective:    Physical Exam  Constitutional: She is oriented to person, place, and time. She appears well-developed and well-nourished. No distress.  HENT:  Head: Normocephalic and atraumatic.  Right Ear: External ear normal.  Left Ear: External ear normal.  Neck: Normal range of motion. Neck supple.  Cardiovascular: Normal rate, regular rhythm and normal heart sounds.  No murmur heard. Pulmonary/Chest: Effort normal and breath sounds normal. No respiratory distress. She has no wheezes. She has no rales.  Abdominal: Soft. There is no abdominal tenderness.  Musculoskeletal: Normal range of motion.        General: No tenderness or edema.  Neurological: She is alert and oriented to person, place, and time. She exhibits normal muscle tone. Coordination normal.  Skin: Skin is warm and dry.  Psychiatric: She has a normal mood and affect. Her behavior is normal.    BP 136/79   Pulse 73   Temp (!) 97.5 F (36.4 C) (Temporal)   Ht 5\' 3"  (1.6 m)   Wt 157 lb 9.6 oz (71.5 kg)   SpO2 97%   BMI 27.92 kg/m   Wt Readings from Last 3 Encounters:  10/21/19 157 lb 9.6 oz (71.5 kg)  08/14/17 161 lb 3.2 oz (73.1 kg)  10/15/16 162 lb 6.4 oz (73.7 kg)     Health Maintenance Due  Topic Date Due  . TETANUS/TDAP  09/29/2019    There are no preventive care reminders to display for this patient.  Lab Results  Component Value Date   TSH 0.302 (L) 08/19/2014   No results found for: WBC, HGB, HCT, MCV, PLT Lab Results  Component Value Date   NA 137 08/19/2014   K 4.6 08/19/2014   CO2 24 08/19/2014   GLUCOSE 123 (H) 08/19/2014   BUN 14 08/19/2014   CREATININE 0.93 08/19/2014   BILITOT 0.9 08/19/2014   ALKPHOS 70 08/19/2014   AST 21 08/19/2014   ALT 14 08/19/2014   PROT 6.9 08/19/2014   ALBUMIN 4.6 08/19/2014   CALCIUM 9.7 08/19/2014   Lab Results  Component Value Date   CHOL 290 (H) 08/19/2014   Lab Results  Component Value Date   HDL 41 08/19/2014   Lab Results  Component Value Date   LDLCALC 201 (H) 08/19/2014   Lab Results  Component Value Date   TRIG 241 (H) 08/19/2014   No results found for: CHOLHDL No results found for: HGBA1C    Assessment & Plan:   Problem List Items Addressed This Visit      Active Problems   Hyperlipidemia with target LDL less than 100   Relevant Medications   hydrochlorothiazide (HYDRODIURIL) 12.5 MG tablet   moexipril (UNIVASC) 15 MG tablet   Other Relevant Orders   CBC   CMP   Lipid   Hypothyroidism   Relevant Medications   levothyroxine (SYNTHROID) 75 MCG tablet   Other Relevant Orders   TSH + free T4   CBC   CMP   Lipid   CBC   CMP   Hypertension - Primary   Relevant Medications   hydrochlorothiazide (HYDRODIURIL) 12.5 MG tablet   moexipril (UNIVASC) 15 MG tablet   Other Relevant Orders   CBC   CMP   Mild cognitive impairment   Relevant Orders   CBC   CMP    Other Visit Diagnoses    Cognitive changes       Relevant Medications   donepezil (ARICEPT) 10 MG tablet   Other Relevant Orders   CBC   CMP       Meds ordered this encounter  Medications  . hydrochlorothiazide (HYDRODIURIL) 12.5 MG tablet    Sig: Take 1 tablet (12.5 mg total) by mouth daily.    Dispense:  90 tablet    Refill:  1  . levothyroxine (SYNTHROID) 75 MCG tablet    Sig: Take 1 tablet (75 mcg total) by mouth daily before breakfast.    Dispense:  30 tablet    Refill:  5  . donepezil (ARICEPT) 10 MG tablet    Sig: Take 1 tablet (10 mg total) by mouth at bedtime.    Dispense:  90 tablet    Refill:  5  . moexipril (UNIVASC) 15 MG tablet    Sig: Take 1 tablet (15 mg total) by mouth at bedtime.    Dispense:  30 tablet    Refill:  5  . sertraline (ZOLOFT) 50 MG tablet    Sig: Take 1 tablet (50 mg total) by mouth daily.    Dispense:  30 tablet    Refill:  0    Follow-up: No follow-ups on file.    Claretta Fraise, MD

## 2019-10-22 LAB — CBC WITH DIFFERENTIAL/PLATELET
Basophils Absolute: 0.1 10*3/uL (ref 0.0–0.2)
Basos: 1 %
EOS (ABSOLUTE): 0.1 10*3/uL (ref 0.0–0.4)
Eos: 1 %
Hematocrit: 42.6 % (ref 34.0–46.6)
Hemoglobin: 14.5 g/dL (ref 11.1–15.9)
Immature Grans (Abs): 0 10*3/uL (ref 0.0–0.1)
Immature Granulocytes: 0 %
Lymphocytes Absolute: 2.3 10*3/uL (ref 0.7–3.1)
Lymphs: 32 %
MCH: 31.8 pg (ref 26.6–33.0)
MCHC: 34 g/dL (ref 31.5–35.7)
MCV: 93 fL (ref 79–97)
Monocytes Absolute: 0.9 10*3/uL (ref 0.1–0.9)
Monocytes: 12 %
Neutrophils Absolute: 3.8 10*3/uL (ref 1.4–7.0)
Neutrophils: 54 %
Platelets: 374 10*3/uL (ref 150–450)
RBC: 4.56 x10E6/uL (ref 3.77–5.28)
RDW: 12.8 % (ref 11.7–15.4)
WBC: 7.1 10*3/uL (ref 3.4–10.8)

## 2019-10-22 LAB — CMP14+EGFR
ALT: 19 IU/L (ref 0–32)
AST: 26 IU/L (ref 0–40)
Albumin/Globulin Ratio: 2 (ref 1.2–2.2)
Albumin: 4.7 g/dL — ABNORMAL HIGH (ref 3.6–4.6)
Alkaline Phosphatase: 74 IU/L (ref 39–117)
BUN/Creatinine Ratio: 27 (ref 12–28)
BUN: 23 mg/dL (ref 8–27)
Bilirubin Total: 0.6 mg/dL (ref 0.0–1.2)
CO2: 23 mmol/L (ref 20–29)
Calcium: 9.8 mg/dL (ref 8.7–10.3)
Chloride: 101 mmol/L (ref 96–106)
Creatinine, Ser: 0.84 mg/dL (ref 0.57–1.00)
GFR calc Af Amer: 73 mL/min/{1.73_m2} (ref >59)
GFR calc non Af Amer: 64 mL/min/{1.73_m2} (ref >59)
Globulin, Total: 2.4 g/dL (ref 1.5–4.5)
Glucose: 98 mg/dL (ref 65–99)
Potassium: 4.1 mmol/L (ref 3.5–5.2)
Sodium: 140 mmol/L (ref 134–144)
Total Protein: 7.1 g/dL (ref 6.0–8.5)

## 2019-10-22 LAB — TSH+FREE T4
Free T4: 1.26 ng/dL (ref 0.82–1.77)
TSH: 2.5 u[IU]/mL (ref 0.450–4.500)

## 2019-10-22 LAB — LIPID PANEL
Chol/HDL Ratio: 5.5 ratio — ABNORMAL HIGH (ref 0.0–4.4)
Cholesterol, Total: 279 mg/dL — ABNORMAL HIGH (ref 100–199)
HDL: 51 mg/dL (ref >39)
LDL Chol Calc (NIH): 193 mg/dL — ABNORMAL HIGH (ref 0–99)
Triglycerides: 184 mg/dL — ABNORMAL HIGH (ref 0–149)
VLDL Cholesterol Cal: 35 mg/dL (ref 5–40)

## 2019-11-02 ENCOUNTER — Telehealth: Payer: Self-pay | Admitting: Family Medicine

## 2019-11-02 NOTE — Telephone Encounter (Signed)
Daughter states she has fell twice in the last 4 mths. Patient has been dizzy.

## 2019-11-02 NOTE — Telephone Encounter (Signed)
She really should be seen. Can we give her my first available appointment?

## 2019-11-02 NOTE — Telephone Encounter (Signed)
Apt scheduled.  

## 2019-11-02 NOTE — Telephone Encounter (Signed)
Patient states dementia is getting worse. Her daughter in-law went through medication and organized it for her. They are wanting to get her med's pre packed at pharmacy. They want [place] to call her and tell her she needs to get her meds pre packaged through pharmacy.Patient uses [facility]. Her husband just passed away. They are not really sure she takes it right. Should we make an appointment with a Family member to come in with her? Please advise? MME? Call daughter in law back [phone].

## 2019-11-09 ENCOUNTER — Telehealth: Payer: Self-pay | Admitting: Neurology

## 2019-11-10 ENCOUNTER — Other Ambulatory Visit: Payer: Self-pay

## 2019-11-11 ENCOUNTER — Ambulatory Visit: Payer: Medicare Other | Admitting: Family Medicine

## 2019-12-28 ENCOUNTER — Telehealth: Payer: Self-pay | Admitting: Family Medicine

## 2019-12-28 NOTE — Telephone Encounter (Signed)
Family is worried that pt is not taking her medication right. They are also concerned since she cancelled her follow up appt with Dr Lucia Gaskins the neurologist. Appt scheduled with [facility] 01/12/20 at 1:10.

## 2019-12-29 ENCOUNTER — Telehealth: Payer: Self-pay | Admitting: Family Medicine

## 2019-12-29 MED ORDER — SERTRALINE HCL 50 MG PO TABS: 50.0000 mg | ORAL_TABLET | Freq: Every day | ORAL | 0 refills | Status: DC

## 2019-12-29 NOTE — Telephone Encounter (Signed)
Aware there is a refill for Aricept available at the pharmacy, 30 day supply of Sertraline sent in and advised she has to keep appt with Dr Darlyn Read for further refills.

## 2019-12-29 NOTE — Telephone Encounter (Signed)
What is the name of the medication? donepezil (ARICEPT) 10 MG tablet  sertraline (ZOLOFT) 50 MG tablet   Have you contacted your pharmacy to request a refill? yes  Which pharmacy would you like this sent to? [facility], she has about 12 pills of one and 10 of the other will need enough to get her through until appt on 01/12/20 was told by Paula Compton to call back with this info so enough would be sent to pharm.   Patient notified that their request is being sent to the clinical staff for review and that they should receive a call once it is complete. If they do not receive a call within 24 hours they can check with their pharmacy or our office.

## 2020-01-11 ENCOUNTER — Other Ambulatory Visit: Payer: Self-pay

## 2020-01-12 ENCOUNTER — Encounter: Payer: Self-pay | Admitting: Family Medicine

## 2020-01-12 ENCOUNTER — Ambulatory Visit (INDEPENDENT_AMBULATORY_CARE_PROVIDER_SITE_OTHER): Payer: Medicare Other | Admitting: Family Medicine

## 2020-01-12 VITALS — Temp 98.0°F | Ht 63.0 in | Wt 150.8 lb

## 2020-01-12 DIAGNOSIS — E034 Atrophy of thyroid (acquired): Secondary | ICD-10-CM

## 2020-01-12 DIAGNOSIS — F411 Generalized anxiety disorder: Secondary | ICD-10-CM | POA: Diagnosis not present

## 2020-01-12 DIAGNOSIS — I10 Essential (primary) hypertension: Secondary | ICD-10-CM | POA: Diagnosis not present

## 2020-01-12 DIAGNOSIS — F0280 Dementia in other diseases classified elsewhere without behavioral disturbance: Secondary | ICD-10-CM

## 2020-01-12 DIAGNOSIS — R42 Dizziness and giddiness: Secondary | ICD-10-CM

## 2020-01-12 DIAGNOSIS — R4189 Other symptoms and signs involving cognitive functions and awareness: Secondary | ICD-10-CM

## 2020-01-12 DIAGNOSIS — G301 Alzheimer's disease with late onset: Secondary | ICD-10-CM

## 2020-01-12 MED ORDER — MEMANTINE HCL ER 7 MG PO CP24: 7.0000 mg | ORAL_CAPSULE | Freq: Every day | ORAL | 1 refills | Status: DC

## 2020-01-12 MED ORDER — MOEXIPRIL HCL 15 MG PO TABS: 15.0000 mg | ORAL_TABLET | Freq: Every day | ORAL | 5 refills | Status: DC

## 2020-01-12 MED ORDER — SERTRALINE HCL 50 MG PO TABS: 50.0000 mg | ORAL_TABLET | Freq: Every day | ORAL | 0 refills | Status: DC

## 2020-01-12 MED ORDER — LEVOTHYROXINE SODIUM 75 MCG PO TABS: 75.0000 ug | ORAL_TABLET | Freq: Every day | ORAL | 5 refills | Status: DC

## 2020-01-12 MED ORDER — DONEPEZIL HCL 10 MG PO TABS: 10.0000 mg | ORAL_TABLET | Freq: Every day | ORAL | 5 refills | Status: DC

## 2020-01-12 NOTE — Progress Notes (Signed)
Subjective:  Patient ID: April [name], female    DOB: 1932/06/10  Age: 86 y.o. MRN: [id number]  CC: Follow-up   HPI April [name] presents for Recheck of mental status.  New MMSE obtained. Having some dizziness. Light headed. Multiple meds taken that contribute.   BP running low end of normal. Had a fall recently. No serious injury.  No LOC  MMSE - Mini Mental State Exam 01/12/2020 08/14/2017  Orientation to time 3 4  Orientation to Place 5 4  Registration 3 2  Attention/ Calculation 2 3  Recall 1 0  Language- name 2 objects 2 2  Language- repeat 1 1  Language- follow 3 step command 3 3  Language- read & follow direction 1 1  Write a sentence 0 1  Copy design 0 0  Total score 21 21     Depression screen [facility] 2/9 01/12/2020 10/21/2019 08/26/2014  Decreased Interest 0 3 0  Down, Depressed, Hopeless 0 3 0  PHQ - 2 Score 0 6 0  Altered sleeping - 0 -  Tired, decreased energy - 0 -  Change in appetite - 0 -  Feeling bad or failure about yourself  - 0 -  Trouble concentrating - 0 -  Moving slowly or fidgety/restless - 0 -  Suicidal thoughts - 0 -  PHQ-9 Score - 6 -    History Tocarra has a past medical history of Arthritis, Cognitive changes, Hyperlipidemia, Hypertension, and Thyroid disease.   She has a past surgical history that includes Vaginal hysterectomy; Hand surgery; and Foot surgery.   Her family history includes Dementia in her sister; Pneumonia in her father.She reports that she has quit smoking. She has never used smokeless tobacco. She reports that she does not drink alcohol or use drugs.    ROS Review of Systems  Constitutional: Negative.   HENT: Negative.   Eyes: Negative for visual disturbance.  Respiratory: Negative for shortness of breath.   Cardiovascular: Negative for chest pain.  Gastrointestinal: Negative for abdominal pain.  Musculoskeletal: Negative for arthralgias.    Objective:  Temp 98 F (36.7 C) (Temporal)   Ht 5\' 3"  (1.6 m)   Wt 150  lb 12.8 oz (68.4 kg)   BMI 26.71 kg/m   BP Readings from Last 3 Encounters:  10/21/19 136/79  08/14/17 124/60  10/15/16 (!) 150/78    Wt Readings from Last 3 Encounters:  01/12/20 150 lb 12.8 oz (68.4 kg)  10/21/19 157 lb 9.6 oz (71.5 kg)  08/14/17 161 lb 3.2 oz (73.1 kg)     Physical Exam Constitutional:      General: She is not in acute distress.    Appearance: She is well-developed.  HENT:     Head: Normocephalic and atraumatic.  Eyes:     Conjunctiva/sclera: Conjunctivae normal.     Pupils: Pupils are equal, round, and reactive to light.  Neck:     Thyroid: No thyromegaly.  Cardiovascular:     Rate and Rhythm: Normal rate and regular rhythm.     Heart sounds: Normal heart sounds. No murmur.  Pulmonary:     Effort: Pulmonary effort is normal. No respiratory distress.     Breath sounds: Normal breath sounds. No wheezing or rales.  Abdominal:     General: There is no distension.     Palpations: Abdomen is soft.     Tenderness: There is no abdominal tenderness.  Musculoskeletal:        General: Normal range of motion.  Cervical back: Normal range of motion and neck supple.  Lymphadenopathy:     Cervical: No cervical adenopathy.  Skin:    General: Skin is warm and dry.  Neurological:     Mental Status: She is alert and oriented to person, place, and time.  Psychiatric:        Behavior: Behavior normal.        Thought Content: Thought content normal.        Judgment: Judgment normal.       Assessment & Plan:   Abigaelle was seen today for follow-up.  Diagnoses and all orders for this visit:  Dizziness -     For home use only DME Other see comment  Essential hypertension -     moexipril (UNIVASC) 15 MG tablet; Take 1 tablet (15 mg total) by mouth at bedtime.  GAD (generalized anxiety disorder) -     sertraline (ZOLOFT) 50 MG tablet; Take 1 tablet (50 mg total) by mouth daily.  Late onset Alzheimer's disease without behavioral disturbance (HCC)   Cognitive changes -     donepezil (ARICEPT) 10 MG tablet; Take 1 tablet (10 mg total) by mouth at bedtime.  Hypothyroidism due to acquired atrophy of thyroid -     levothyroxine (SYNTHROID) 75 MCG tablet; Take 1 tablet (75 mcg total) by mouth daily before breakfast.  Other orders -     memantine (NAMENDA XR) 7 MG CP24 24 hr capsule; Take 1 capsule (7 mg total) by mouth daily.    Primary concern for her today is progression to Alzheimers from Mild cognitive impairment based on MMSE. Als of concern is the dizziness. Much of the dizziness may be related to her medication. I DCed her HCTZ as a result. I also felt I could only use the lowest possible dose of Memantine as a result. Daughter present and active in monitoring both cognition and the dizziness.   I have discontinued Delayne M. Gallego's hydrochlorothiazide. I am also having her start on memantine. Additionally, I am having her maintain her aspirin, Vitamin D3, ibuprofen, diazepam, Melatonin, donepezil, moexipril, sertraline, and levothyroxine.  Allergies as of 01/12/2020      Reactions   Acid Reducer [cimetidine] Nausea Only   Mevacor [lovastatin] Nausea Only      Medication List       Accurate as of January 12, 2020  6:43 PM. If you have any questions, ask your nurse or doctor.        STOP taking these medications   hydrochlorothiazide 12.5 MG tablet Commonly known as: HYDRODIURIL Stopped by: Mechele Claude, MD     TAKE these medications   aspirin 81 MG tablet Take 81 mg by mouth daily.   diazepam 5 MG tablet Commonly known as: VALIUM Take 1 tablet (5 mg total) by mouth every 6 (six) hours as needed for anxiety.   donepezil 10 MG tablet Commonly known as: ARICEPT Take 1 tablet (10 mg total) by mouth at bedtime.   ibuprofen 600 MG tablet Commonly known as: ADVIL Take 1 tablet (600 mg total) by mouth 2 (two) times daily.   levothyroxine 75 MCG tablet Commonly known as: SYNTHROID Take 1 tablet (75 mcg total) by  mouth daily before breakfast.   Melatonin 3 MG Tabs Take by mouth.   memantine 7 MG Cp24 24 hr capsule Commonly known as: NAMENDA XR Take 1 capsule (7 mg total) by mouth daily. Started by: Mechele Claude, MD   moexipril 15 MG tablet Commonly known as: UNIVASC Take  1 tablet (15 mg total) by mouth at bedtime.   sertraline 50 MG tablet Commonly known as: ZOLOFT Take 1 tablet (50 mg total) by mouth daily.   Vitamin D3 50 MCG (2000 UT) Tabs Take 1 tablet by mouth daily.            Durable Medical Equipment  (From admission, onward)         Start     Ordered   01/12/20 0000  For home use only DME Other see comment    Comments: Quad cane  [id number]  Question:  Length of Need  Answer:  Lifetime   01/12/20 1408           Follow-up: Return in about 6 weeks (around 02/23/2020) for dizziness, blood pressure, tolerance of namenda.  Mechele Claude, M.D.

## 2020-01-13 ENCOUNTER — Telehealth: Payer: Self-pay | Admitting: Family Medicine

## 2020-01-13 NOTE — Telephone Encounter (Signed)
LMTCB

## 2020-01-13 NOTE — Telephone Encounter (Signed)
Pt aware, continue the moexipril

## 2020-01-18 ENCOUNTER — Ambulatory Visit: Payer: Medicare Other | Admitting: Neurology

## 2020-01-27 ENCOUNTER — Other Ambulatory Visit: Payer: Self-pay | Admitting: Family Medicine

## 2020-01-27 ENCOUNTER — Telehealth: Payer: Self-pay | Admitting: Family Medicine

## 2020-01-27 DIAGNOSIS — F411 Generalized anxiety disorder: Secondary | ICD-10-CM

## 2020-01-27 DIAGNOSIS — R4189 Other symptoms and signs involving cognitive functions and awareness: Secondary | ICD-10-CM

## 2020-01-27 DIAGNOSIS — I10 Essential (primary) hypertension: Secondary | ICD-10-CM

## 2020-01-27 DIAGNOSIS — E034 Atrophy of thyroid (acquired): Secondary | ICD-10-CM

## 2020-01-27 MED ORDER — SERTRALINE HCL 50 MG PO TABS: 50.0000 mg | ORAL_TABLET | Freq: Every day | ORAL | 0 refills | Status: DC

## 2020-01-27 MED ORDER — DONEPEZIL HCL 10 MG PO TABS: 10.0000 mg | ORAL_TABLET | Freq: Every day | ORAL | 5 refills | Status: DC

## 2020-01-27 MED ORDER — MOEXIPRIL HCL 15 MG PO TABS: 15.0000 mg | ORAL_TABLET | Freq: Every day | ORAL | 5 refills | Status: DC

## 2020-01-27 MED ORDER — LEVOTHYROXINE SODIUM 75 MCG PO TABS: 75.0000 ug | ORAL_TABLET | Freq: Every day | ORAL | 5 refills | Status: DC

## 2020-01-27 MED ORDER — MEMANTINE HCL ER 7 MG PO CP24: 7.0000 mg | ORAL_CAPSULE | Freq: Every day | ORAL | 1 refills | Status: DC

## 2020-01-27 NOTE — Telephone Encounter (Signed)
Patient's son has taken her medications.  She needs refill sent to [facility] of Aricept, Levothyroxine, Namenda XR, Univasc and Sertraline.

## 2020-01-27 NOTE — Telephone Encounter (Signed)
Patient aware that medications have been sent in 

## 2020-01-27 NOTE — Telephone Encounter (Signed)
Done. Thanks, WS 

## 2020-02-24 ENCOUNTER — Other Ambulatory Visit: Payer: Self-pay

## 2020-02-24 ENCOUNTER — Ambulatory Visit (INDEPENDENT_AMBULATORY_CARE_PROVIDER_SITE_OTHER): Payer: Medicare Other | Admitting: Family Medicine

## 2020-02-24 ENCOUNTER — Encounter: Payer: Self-pay | Admitting: Family Medicine

## 2020-02-24 VITALS — BP 123/60 | HR 80 | Temp 97.1°F | Ht 63.0 in | Wt 145.8 lb

## 2020-02-24 DIAGNOSIS — E039 Hypothyroidism, unspecified: Secondary | ICD-10-CM | POA: Diagnosis not present

## 2020-02-24 DIAGNOSIS — I10 Essential (primary) hypertension: Secondary | ICD-10-CM | POA: Diagnosis not present

## 2020-02-24 DIAGNOSIS — F0280 Dementia in other diseases classified elsewhere without behavioral disturbance: Secondary | ICD-10-CM

## 2020-02-24 DIAGNOSIS — G301 Alzheimer's disease with late onset: Secondary | ICD-10-CM

## 2020-02-24 MED ORDER — MEMANTINE HCL ER 14 MG PO CP24: 14.0000 mg | ORAL_CAPSULE | Freq: Every day | ORAL | 0 refills | Status: DC

## 2020-02-24 NOTE — Progress Notes (Signed)
Subjective:  Patient ID: April [name], female    DOB: July 30, 1932  Age: 86 y.o. MRN: [id number]  CC: Dizziness (6 week follow up) and Hypertension   HPI April [name] presents for Dizziness has resolved. Blood pressure is better.  Patient is accompanied by her daughter who indicates that there is some measure of improvement.  She is tolerating the memantine but it just does not seem to have helped a lot yet.  She has had some agitation and forgetfulness and confusion as well.  Patient does not seem to be aware of the use and the daughter is afraid to confront her about him.  Depression screen [facility] 2/9 02/24/2020 01/12/2020 10/21/2019  Decreased Interest 0 0 3  Down, Depressed, Hopeless 0 0 3  PHQ - 2 Score 0 0 6  Altered sleeping - - 0  Tired, decreased energy - - 0  Change in appetite - - 0  Feeling bad or failure about yourself  - - 0  Trouble concentrating - - 0  Moving slowly or fidgety/restless - - 0  Suicidal thoughts - - 0  PHQ-9 Score - - 6    History April [name] has a past medical history of Arthritis, Cognitive changes, Hyperlipidemia, Hypertension, and Thyroid disease.   She has a past surgical history that includes Vaginal hysterectomy; Hand surgery; and Foot surgery.   Her family history includes Dementia in her sister; Pneumonia in her father.She reports that she has quit smoking. She has never used smokeless tobacco. She reports that she does not drink alcohol or use drugs.    ROS Review of Systems  Constitutional: Negative.   HENT: Negative.   Eyes: Negative for visual disturbance.  Respiratory: Negative for shortness of breath.   Cardiovascular: Negative for chest pain.  Gastrointestinal: Negative for abdominal pain.  Musculoskeletal: Negative for arthralgias.  Neurological: Negative for dizziness.  Psychiatric/Behavioral: Positive for agitation, confusion and decreased concentration. Negative for behavioral problems.    Objective:  BP 123/60   Pulse 80    Temp (!) 97.1 F (36.2 C) (Temporal)   Ht 5\' 3"  (1.6 m)   Wt 145 lb 12.8 oz (66.1 kg)   SpO2 95%   BMI 25.83 kg/m   BP Readings from Last 3 Encounters:  02/24/20 123/60  10/21/19 136/79  08/14/17 124/60    Wt Readings from Last 3 Encounters:  02/24/20 145 lb 12.8 oz (66.1 kg)  01/12/20 150 lb 12.8 oz (68.4 kg)  10/21/19 157 lb 9.6 oz (71.5 kg)     Physical Exam Constitutional:      General: She is not in acute distress.    Appearance: She is well-developed.  HENT:     Head: Normocephalic and atraumatic.  Eyes:     Conjunctiva/sclera: Conjunctivae normal.     Pupils: Pupils are equal, round, and reactive to light.  Neck:     Thyroid: No thyromegaly.  Cardiovascular:     Rate and Rhythm: Normal rate and regular rhythm.     Heart sounds: Normal heart sounds. No murmur.  Pulmonary:     Effort: Pulmonary effort is normal. No respiratory distress.     Breath sounds: Normal breath sounds. No wheezing or rales.  Abdominal:     General: Bowel sounds are normal. There is no distension.     Palpations: Abdomen is soft.     Tenderness: There is no abdominal tenderness.  Musculoskeletal:        General: Normal range of motion.  Cervical back: Normal range of motion and neck supple.  Lymphadenopathy:     Cervical: No cervical adenopathy.  Skin:    General: Skin is warm and dry.  Neurological:     Mental Status: She is alert and oriented to person, place, and time.  Psychiatric:        Behavior: Behavior normal.        Thought Content: Thought content normal.        Judgment: Judgment normal.       Assessment & Plan:   There are no diagnoses linked to this encounter.     I have discontinued April [name]'s diazepam. I have also changed her memantine. Additionally, I am having her maintain her aspirin, Vitamin D3, ibuprofen, Melatonin, donepezil, levothyroxine, moexipril, and sertraline.  Allergies as of 02/24/2020      Reactions   Acid Reducer [cimetidine]  Nausea Only   Mevacor [lovastatin] Nausea Only      Medication List       Accurate as of February 24, 2020 10:36 PM. If you have any questions, ask your nurse or doctor.        STOP taking these medications   diazepam 5 MG tablet Commonly known as: VALIUM Stopped by: Mechele Claude, MD     TAKE these medications   aspirin 81 MG tablet Take 81 mg by mouth daily.   donepezil 10 MG tablet Commonly known as: ARICEPT Take 1 tablet (10 mg total) by mouth at bedtime.   ibuprofen 600 MG tablet Commonly known as: ADVIL Take 1 tablet (600 mg total) by mouth 2 (two) times daily.   levothyroxine 75 MCG tablet Commonly known as: SYNTHROID Take 1 tablet (75 mcg total) by mouth daily before breakfast.   Melatonin 3 MG Tabs Take by mouth.   memantine 14 MG Cp24 24 hr capsule Commonly known as: NAMENDA XR Take 1 capsule (14 mg total) by mouth daily. What changed:   medication strength  how much to take Changed by: Mechele Claude, MD   moexipril 15 MG tablet Commonly known as: UNIVASC Take 1 tablet (15 mg total) by mouth at bedtime.   sertraline 50 MG tablet Commonly known as: ZOLOFT Take 1 tablet (50 mg total) by mouth daily.   Vitamin D3 50 MCG (2000 UT) Tabs Take 1 tablet by mouth daily.       Based on history of symptoms and confusion, the patient has progression of her Alzheimer's.  I went ahead with an increase in her Namenda XR to 14 mg for the next several weeks.  In 6 weeks we will follow up and determine if further increase to the full 28 is necessary.  In the meantime were following blood pressure and thyroid which appear to be stable.  She or the daughter will let me know if there is any recurrence of the dizziness.  Ironically blood pressure is actually better after discontinuing the hydrochlorothiazide for blood pressure at this point.  She denies any swelling to. Follow-up: Return in about 6 weeks (around 04/06/2020).  Mechele Claude, M.D.

## 2020-04-06 ENCOUNTER — Ambulatory Visit: Payer: Medicare Other | Admitting: Family Medicine

## 2020-04-06 ENCOUNTER — Encounter: Payer: Self-pay | Admitting: Family Medicine

## 2020-04-06 ENCOUNTER — Ambulatory Visit (INDEPENDENT_AMBULATORY_CARE_PROVIDER_SITE_OTHER): Payer: Medicare Other | Admitting: Family Medicine

## 2020-04-06 ENCOUNTER — Other Ambulatory Visit: Payer: Self-pay

## 2020-04-06 VITALS — BP 136/77 | HR 81 | Temp 97.7°F | Resp 20 | Ht 63.0 in | Wt 145.0 lb

## 2020-04-06 DIAGNOSIS — E039 Hypothyroidism, unspecified: Secondary | ICD-10-CM

## 2020-04-06 DIAGNOSIS — E785 Hyperlipidemia, unspecified: Secondary | ICD-10-CM | POA: Diagnosis not present

## 2020-04-06 DIAGNOSIS — I10 Essential (primary) hypertension: Secondary | ICD-10-CM | POA: Diagnosis not present

## 2020-04-06 DIAGNOSIS — F0280 Dementia in other diseases classified elsewhere without behavioral disturbance: Secondary | ICD-10-CM

## 2020-04-06 DIAGNOSIS — F411 Generalized anxiety disorder: Secondary | ICD-10-CM

## 2020-04-06 DIAGNOSIS — G301 Alzheimer's disease with late onset: Secondary | ICD-10-CM

## 2020-04-06 MED ORDER — MEMANTINE HCL ER 21 MG PO CP24: 21.0000 mg | ORAL_CAPSULE | Freq: Every day | ORAL | 2 refills | Status: DC

## 2020-04-06 NOTE — Progress Notes (Signed)
Subjective:  Patient ID: April [name], female    DOB: 24-Dec-1931  Age: 86 y.o. MRN: [id number]  CC: No chief complaint on file.   HPI April [name] presents for patient presents for follow-up on  thyroid. The patient has a history of hypothyroidism for many years. It has been stable recently. Pt. denies any change in  voice, loss of hair, heat or cold intolerance. Energy level has been adequate to good. Patient denies constipation and diarrhea. No myxedema. Medication is as noted below. Verified that pt is taking it daily on an empty stomach. Well tolerated. Patient in for follow-up of elevated cholesterol. Doing well without complaints on current medication. Denies side effects of statin including myalgia and arthralgia and nausea. Also in today for liver function testing. Currently no chest pain, shortness of breath or other cardiovascular related symptoms noted.   Follow-up of hypertension. Patient has no history of headache chest pain or shortness of breath or recent cough. Patient also denies symptoms of TIA such as numbness weakness lateralizing. Patient checks  blood pressure at home and has not had any elevated readings recently. Patient denies side effects from his medication. States taking it regularly.  Patient is treated for Alzheimer's disease as well.  Much of the history is given by caregiver today.  She is taking donepezil regularly along with Namenda.   Depression screen [facility] 2/9 04/07/2020 02/24/2020 01/12/2020  Decreased Interest 0 0 0  Down, Depressed, Hopeless 0 0 0  PHQ - 2 Score 0 0 0  Altered sleeping - - -  Tired, decreased energy - - -  Change in appetite - - -  Feeling bad or failure about yourself  - - -  Trouble concentrating - - -  Moving slowly or fidgety/restless - - -  Suicidal thoughts - - -  PHQ-9 Score - - -    History April [name] has a past medical history of Arthritis, Cognitive changes, Hyperlipidemia, Hypertension, and Thyroid disease.   She has a past  surgical history that includes Vaginal hysterectomy; Hand surgery; and Foot surgery.   Her family history includes Alcohol abuse in her father; Dementia in her sister; Pneumonia in her father.She reports that she has quit smoking. She has never used smokeless tobacco. She reports that she does not drink alcohol or use drugs.    ROS Review of Systems  Unable to perform ROS: Dementia    Objective:  BP 136/77   Pulse 81   Temp 97.7 F (36.5 C) (Temporal)   Resp 20   Ht 5' 3"  (1.6 m)   Wt 145 lb (65.8 kg)   SpO2 99%   BMI 25.69 kg/m   BP Readings from Last 3 Encounters:  04/06/20 136/77  02/24/20 123/60  10/21/19 136/79    Wt Readings from Last 3 Encounters:  04/06/20 145 lb (65.8 kg)  02/24/20 145 lb 12.8 oz (66.1 kg)  01/12/20 150 lb 12.8 oz (68.4 kg)     Physical Exam Constitutional:      General: She is not in acute distress.    Appearance: She is well-developed.  HENT:     Head: Normocephalic and atraumatic.     Right Ear: External ear normal.     Left Ear: External ear normal.     Nose: Nose normal.  Eyes:     Conjunctiva/sclera: Conjunctivae normal.     Pupils: Pupils are equal, round, and reactive to light.  Neck:     Thyroid: No thyromegaly.  Cardiovascular:  Rate and Rhythm: Normal rate and regular rhythm.     Heart sounds: Normal heart sounds. No murmur.  Pulmonary:     Effort: Pulmonary effort is normal. No respiratory distress.     Breath sounds: Normal breath sounds. No wheezing or rales.  Abdominal:     General: Bowel sounds are normal. There is no distension.     Palpations: Abdomen is soft.     Tenderness: There is no abdominal tenderness.  Musculoskeletal:     Cervical back: Normal range of motion and neck supple.  Lymphadenopathy:     Cervical: No cervical adenopathy.  Skin:    General: Skin is warm and dry.  Neurological:     Mental Status: She is alert and oriented to person, place, and time.     Deep Tendon Reflexes: Reflexes  are normal and symmetric.  Psychiatric:        Behavior: Behavior normal.        Thought Content: Thought content normal.        Judgment: Judgment normal.       Assessment & Plan:   Diagnoses and all orders for this visit:  Acquired hypothyroidism -     CBC with Differential/Platelet -     CMP14+EGFR -     TSH + free T4  Hyperlipidemia with target LDL less than 100 -     CBC with Differential/Platelet -     CMP14+EGFR -     Lipid panel  Essential hypertension -     CBC with Differential/Platelet -     CMP14+EGFR  GAD (generalized anxiety disorder)  Late onset Alzheimer's disease without behavioral disturbance (HCC) -     memantine (NAMENDA XR) 21 MG CP24 24 hr capsule; Take 1 capsule (21 mg total) by mouth daily.       I have changed April [name]'s memantine. I am also having her maintain her aspirin, Vitamin D3, ibuprofen, melatonin, donepezil, levothyroxine, moexipril, and sertraline.  Allergies as of 04/06/2020      Reactions   Acid Reducer [cimetidine] Nausea Only   Mevacor [lovastatin] Nausea Only      Medication List       Accurate as of April 06, 2020 11:59 PM. If you have any questions, ask your nurse or doctor.        aspirin 81 MG tablet Take 81 mg by mouth daily.   donepezil 10 MG tablet Commonly known as: ARICEPT Take 1 tablet (10 mg total) by mouth at bedtime.   ibuprofen 600 MG tablet Commonly known as: ADVIL Take 1 tablet (600 mg total) by mouth 2 (two) times daily.   levothyroxine 75 MCG tablet Commonly known as: SYNTHROID Take 1 tablet (75 mcg total) by mouth daily before breakfast.   melatonin 3 MG Tabs tablet Take by mouth.   memantine 21 MG Cp24 24 hr capsule Commonly known as: NAMENDA XR Take 1 capsule (21 mg total) by mouth daily. What changed:   medication strength  how much to take Changed by: Claretta Fraise, MD   moexipril 15 MG tablet Commonly known as: UNIVASC Take 1 tablet (15 mg total) by mouth at  bedtime.   sertraline 50 MG tablet Commonly known as: ZOLOFT Take 1 tablet (50 mg total) by mouth daily.   Vitamin D3 50 MCG (2000 UT) Tabs Take 1 tablet by mouth daily.        Follow-up: Return in about 6 months (around 10/06/2020).  Claretta Fraise, M.D.

## 2020-04-07 ENCOUNTER — Ambulatory Visit (INDEPENDENT_AMBULATORY_CARE_PROVIDER_SITE_OTHER): Payer: Medicare Other | Admitting: *Deleted

## 2020-04-07 DIAGNOSIS — Z0000 Encounter for general adult medical examination without abnormal findings: Secondary | ICD-10-CM | POA: Diagnosis not present

## 2020-04-07 LAB — CBC WITH DIFFERENTIAL/PLATELET
Basophils Absolute: 0.1 10*3/uL (ref 0.0–0.2)
Basos: 1 %
EOS (ABSOLUTE): 0.2 10*3/uL (ref 0.0–0.4)
Eos: 2 %
Hematocrit: 39.4 % (ref 34.0–46.6)
Hemoglobin: 12.9 g/dL (ref 11.1–15.9)
Immature Grans (Abs): 0.1 10*3/uL (ref 0.0–0.1)
Immature Granulocytes: 1 %
Lymphocytes Absolute: 2.2 10*3/uL (ref 0.7–3.1)
Lymphs: 20 %
MCH: 30.1 pg (ref 26.6–33.0)
MCHC: 32.7 g/dL (ref 31.5–35.7)
MCV: 92 fL (ref 79–97)
Monocytes Absolute: 1.1 10*3/uL — ABNORMAL HIGH (ref 0.1–0.9)
Monocytes: 10 %
Neutrophils Absolute: 7.2 10*3/uL — ABNORMAL HIGH (ref 1.4–7.0)
Neutrophils: 66 %
Platelets: 459 10*3/uL — ABNORMAL HIGH (ref 150–450)
RBC: 4.28 x10E6/uL (ref 3.77–5.28)
RDW: 13 % (ref 11.7–15.4)
WBC: 10.8 10*3/uL (ref 3.4–10.8)

## 2020-04-07 LAB — TSH+FREE T4
Free T4: 1.17 ng/dL (ref 0.82–1.77)
TSH: 1.26 u[IU]/mL (ref 0.450–4.500)

## 2020-04-07 LAB — CMP14+EGFR
ALT: 11 IU/L (ref 0–32)
AST: 14 IU/L (ref 0–40)
Albumin/Globulin Ratio: 1.6 (ref 1.2–2.2)
Albumin: 4 g/dL (ref 3.6–4.6)
Alkaline Phosphatase: 95 IU/L (ref 39–117)
BUN/Creatinine Ratio: 19 (ref 12–28)
BUN: 13 mg/dL (ref 8–27)
Bilirubin Total: 0.3 mg/dL (ref 0.0–1.2)
CO2: 28 mmol/L (ref 20–29)
Calcium: 9.3 mg/dL (ref 8.7–10.3)
Chloride: 100 mmol/L (ref 96–106)
Creatinine, Ser: 0.68 mg/dL (ref 0.57–1.00)
GFR calc Af Amer: 92 mL/min/{1.73_m2} (ref >59)
GFR calc non Af Amer: 80 mL/min/{1.73_m2} (ref >59)
Globulin, Total: 2.5 g/dL (ref 1.5–4.5)
Glucose: 96 mg/dL (ref 65–99)
Potassium: 3.9 mmol/L (ref 3.5–5.2)
Sodium: 140 mmol/L (ref 134–144)
Total Protein: 6.5 g/dL (ref 6.0–8.5)

## 2020-04-07 LAB — LIPID PANEL
Chol/HDL Ratio: 6.4 ratio — ABNORMAL HIGH (ref 0.0–4.4)
Cholesterol, Total: 237 mg/dL — ABNORMAL HIGH (ref 100–199)
HDL: 37 mg/dL — ABNORMAL LOW (ref >39)
LDL Chol Calc (NIH): 136 mg/dL — ABNORMAL HIGH (ref 0–99)
Triglycerides: 350 mg/dL — ABNORMAL HIGH (ref 0–149)
VLDL Cholesterol Cal: 64 mg/dL — ABNORMAL HIGH (ref 5–40)

## 2020-04-07 NOTE — Progress Notes (Signed)
MEDICARE ANNUAL WELLNESS VISIT  04/07/2020  Telephone Visit Disclaimer This Medicare AWV was conducted by telephone due to national recommendations for restrictions regarding the COVID-19 Pandemic (e.g. social distancing).  I verified, using two identifiers, that I am speaking with April [name] or their authorized healthcare agent. I discussed the limitations, risks, security, and privacy concerns of performing an evaluation and management service by telephone and the potential availability of an in-person appointment in the future. The patient expressed understanding and agreed to proceed.   Subjective:  April [name] is a 86 y.o. female patient of Stacks, Broadus John, MD who had a Medicare Annual Wellness Visit today via telephone. Jared is retired and lives with her son. She is recently widowed and has 2 sons. She reports that she is socially active and does interact with friends/family regularly. She is not physically active and enjoys sewing.  Patient Care Team: Mechele Claude, MD as PCP - General (Family Medicine)  Advanced Directives 04/07/2020  Does Patient Have a Medical Advance Directive? Yes  Type of Advance Directive Healthcare Power of [facility] Utilization Over the Past 12 Months: # of hospitalizations or ER visits: 0 # of surgeries: 0  Review of Systems    Patient reports that her overall health is unchanged compared to last year.  History obtained from the patient and patient chart.   Patient Reported Readings (BP, Pulse, CBG, Weight, etc) none  Pain Assessment Pain : No/denies pain     Current Medications & Allergies (verified) Allergies as of 04/07/2020      Reactions   Acid Reducer [cimetidine] Nausea Only   Mevacor [lovastatin] Nausea Only      Medication List       Accurate as of April 07, 2020  9:03 AM. If you have any questions, ask your nurse or doctor.        aspirin 81 MG tablet Take 81 mg by mouth daily.   donepezil 10 MG  tablet Commonly known as: ARICEPT Take 1 tablet (10 mg total) by mouth at bedtime.   ibuprofen 600 MG tablet Commonly known as: ADVIL Take 1 tablet (600 mg total) by mouth 2 (two) times daily.   levothyroxine 75 MCG tablet Commonly known as: SYNTHROID Take 1 tablet (75 mcg total) by mouth daily before breakfast.   melatonin 3 MG Tabs tablet Take by mouth.   memantine 21 MG Cp24 24 hr capsule Commonly known as: NAMENDA XR Take 1 capsule (21 mg total) by mouth daily.   moexipril 15 MG tablet Commonly known as: UNIVASC Take 1 tablet (15 mg total) by mouth at bedtime.   sertraline 50 MG tablet Commonly known as: ZOLOFT Take 1 tablet (50 mg total) by mouth daily.   Vitamin D3 50 MCG (2000 UT) Tabs Take 1 tablet by mouth daily.       History (reviewed): Past Medical History:  Diagnosis Date  . Arthritis   . Cognitive changes   . Hyperlipidemia   . Hypertension   . Thyroid disease    Past Surgical History:  Procedure Laterality Date  . FOOT SURGERY     Arthritis   . HAND SURGERY     Arthritis   . VAGINAL HYSTERECTOMY     Family History  Problem Relation Age of Onset  . Pneumonia Father   . Alcohol abuse Father   . Dementia Sister    Social History   Socioeconomic History  . Marital status: Widowed  Spouse name: Not on file  . Number of children: 2  . Years of education: 2  . Highest education level: 12th grade  Occupational History  . Occupation: retired  Tobacco Use  . Smoking status: Former Games developer  . Smokeless tobacco: Never Used  Substance and Sexual Activity  . Alcohol use: No    Alcohol/week: 1.0 standard drinks    Types: 1 Glasses of wine per week    Comment: socially  . Drug use: No  . Sexual activity: Not on file  Other Topics Concern  . Not on file  Social History Narrative   Lives at home with husband   Right-handed   Caffeine use: Drinks green tea/ soda and coffee once in a while    Social Determinants of Health   Financial  Resource Strain:   . Difficulty of Paying Living Expenses:   Food Insecurity:   . Worried About Programme researcher, broadcasting/film/video in the Last Year:   . Barista in the Last Year:   Transportation Needs:   . Freight forwarder (Medical):   Marland Kitchen Lack of Transportation (Non-Medical):   Physical Activity:   . Days of Exercise per Week:   . Minutes of Exercise per Session:   Stress:   . Feeling of Stress :   Social Connections:   . Frequency of Communication with Friends and Family:   . Frequency of Social Gatherings with Friends and Family:   . Attends Religious Services:   . Active Member of Clubs or Organizations:   . Attends Banker Meetings:   Marland Kitchen Marital Status:     Activities of Daily Living In your present state of health, do you have any difficulty performing the following activities: 04/07/2020  Hearing? N  Vision? N  Difficulty concentrating or making decisions? Y  Comment memory, states she has dementia  Walking or climbing stairs? N  Dressing or bathing? N  Doing errands, shopping? N  Preparing Food and eating ? N  Using the Toilet? N  In the past six months, have you accidently leaked urine? N  Do you have problems with loss of bowel control? N  Managing your Medications? N  Managing your Finances? N  Housekeeping or managing your Housekeeping? N  Some recent data might be hidden    Patient Education/ Literacy How often do you need to have someone help you when you read instructions, pamphlets, or other written materials from your doctor or pharmacy?: 1 - Never What is the last grade level you completed in school?: 12  Exercise Current Exercise Habits: The patient does not participate in regular exercise at present, Exercise limited by: None identified  Diet Patient reports consuming 3 meals a day and 2 snack(s) a day Patient reports that her primary diet is: Regular Patient reports that she does have regular access to food.   Depression Screen PHQ  2/9 Scores 04/07/2020 02/24/2020 01/12/2020 10/21/2019 08/26/2014 02/08/2014 08/26/2013  PHQ - 2 Score 0 0 0 6 0 0 0  PHQ- 9 Score - - - 6 - - -     Fall Risk Fall Risk  04/07/2020 04/06/2020 02/24/2020 01/12/2020 10/21/2019  Falls in the past year? 1 1 1 1 1   Number falls in past yr: 1 1 1 1 1   Injury with Fall? 0 0 0 0 0  Risk for fall due to : History of fall(s) History of fall(s) - History of fall(s);Mental status change -  Follow up Falls prevention  discussed Falls evaluation completed - - -     Objective:  April [name] seemed alert and oriented and she participated appropriately during our telephone visit.  Blood Pressure Weight BMI  BP Readings from Last 3 Encounters:  04/06/20 136/77  02/24/20 123/60  10/21/19 136/79   Wt Readings from Last 3 Encounters:  04/06/20 145 lb (65.8 kg)  02/24/20 145 lb 12.8 oz (66.1 kg)  01/12/20 150 lb 12.8 oz (68.4 kg)   BMI Readings from Last 1 Encounters:  04/06/20 25.69 kg/m    *Unable to obtain current vital signs, weight, and BMI due to telephone visit type  Hearing/Vision  . [place] did not seem to have difficulty with hearing/understanding during the telephone conversation . Reports that she has not had a formal eye exam by an eye care professional within the past year . Reports that she has not had a formal hearing evaluation within the past year *Unable to fully assess hearing and vision during telephone visit type  Cognitive Function: 6CIT Screen 04/07/2020  What Year? 0 points  What month? 3 points  What time? 0 points  Count back from 20 4 points  Months in reverse 0 points  Repeat phrase 6 points  Total Score 13   (Normal:0-7, Significant for Dysfunction: >8)  Normal Cognitive Function Screening: No: 13    Immunization & Health Maintenance Record Immunization History  Administered Date(s) Administered  . Influenza Split 09/14/2013  . Influenza,inj,quad, With Preservative 09/02/2019  . Influenza-Unspecified 09/14/2013,  09/03/2016, 09/16/2017, 09/10/2018, 09/10/2019  . Pneumococcal Conjugate-13 10/11/1999  . Pneumococcal Polysaccharide-23 09/10/2019  . Td 09/28/2009  . Tdap 10/21/2019    Health Maintenance  Topic Date Due  . COVID-19 Vaccine (1) Never done  . INFLUENZA VACCINE  07/10/2020  . TETANUS/TDAP  10/20/2029  . DEXA SCAN  Completed  . PNA vac Low Risk Adult  Completed       Assessment  This is a routine wellness examination for April [name].  Health Maintenance: Due or Overdue Health Maintenance Due  Topic Date Due  . COVID-19 Vaccine (1) Never done    April [name] does not need a referral for Community Assistance: Care Management:   no Social Work:    no Prescription Assistance:  no Nutrition/Diabetes Education:  no   Plan:  Personalized Goals Goals Addressed            This Visit's Progress   . Patient Stated       04/07/2020 AWV Goal: Exercise for General Health   Patient will verbalize understanding of the benefits of increased physical activity:  Exercising regularly is important. It will improve your overall fitness, flexibility, and endurance.  Regular exercise also will improve your overall health. It can help you control your weight, reduce stress, and improve your bone density.  Over the next year, patient will increase physical activity as tolerated with a goal of at least 150 minutes of moderate physical activity per week.   You can tell that you are exercising at a moderate intensity if your heart starts beating faster and you start breathing faster but can still hold a conversation.  Moderate-intensity exercise ideas include:  Walking 1 mile (1.6 km) in about 15 minutes  Biking  Hiking  Golfing  Dancing  Water aerobics  Patient will verbalize understanding of everyday activities that increase physical activity by providing examples like the following: ? Yard work, such as: ? Pushing a Surveyor, mining ? Raking and bagging leaves ? Washing  your  car ? Pushing a stroller ? Shoveling snow ? Gardening ? Washing windows or floors  Patient will be able to explain general safety guidelines for exercising:   Before you start a new exercise program, talk with your health care provider.  Do not exercise so much that you hurt yourself, feel dizzy, or get very short of breath.  Wear comfortable clothes and wear shoes with good support.  Drink plenty of water while you exercise to prevent dehydration or heat stroke.  Work out until your breathing and your heartbeat get faster.       Personalized Health Maintenance & Screening Recommendations  Lung Cancer Screening Recommended: no (Low Dose CT Chest recommended if Age 9-80 years, 30 pack-year currently smoking OR have quit w/in past 15 years) Hepatitis C Screening recommended: no HIV Screening recommended: no  Advanced Directives: Written information was not prepared per patient's request.  Referrals & Orders No orders of the defined types were placed in this encounter.   Follow-up Plan . Follow-up with April Fraise, MD as planned   I have personally reviewed and noted the following in the patient's chart:   . Medical and social history . Use of alcohol, tobacco or illicit drugs  . Current medications and supplements . Functional ability and status . Nutritional status . Physical activity . Advanced directives . List of other physicians . Hospitalizations, surgeries, and ER visits in previous 12 months . Vitals . Screenings to include cognitive, depression, and falls . Referrals and appointments  In addition, I have reviewed and discussed with Janifer Adie Ghuman certain preventive protocols, quality metrics, and best practice recommendations. A written personalized care plan for preventive services as well as general preventive health recommendations is available and can be mailed to the patient at her request.      Baldomero Lamy, LPN  08/30/1938

## 2020-04-07 NOTE — Progress Notes (Signed)
Hello April [name],  Your lab result is normal and/or stable.Some minor variations that are not significant are commonly marked abnormal, but do not represent any medical problem for you.  Best regards, Annlouise Gerety, M.D.

## 2020-04-10 ENCOUNTER — Encounter: Payer: Self-pay | Admitting: Family Medicine

## 2020-04-11 ENCOUNTER — Telehealth: Payer: Self-pay | Admitting: Family Medicine

## 2020-04-11 NOTE — Telephone Encounter (Signed)
Pt. Upset that I wanted to have her see someone for her sinuses. I did not have that conversation with her.  Did ask her to increase the dose of dementia medication, fist gets up - sounds like memantine. Daughter in law, Marylu Lund says she is dizzy when she first gets up - sounds like orthostasis. I recommended drinking more water. Se should also walk with a cane or walker if off balance. Pt. May want to switch to Gennette Pac as her PCP. Marylu Lund will make the appt. Change if appropriate.  Mechele Claude, MD

## 2020-04-19 ENCOUNTER — Ambulatory Visit: Payer: Medicare Other | Admitting: Family Medicine

## 2020-04-27 ENCOUNTER — Other Ambulatory Visit: Payer: Self-pay | Admitting: Family Medicine

## 2020-04-27 DIAGNOSIS — F411 Generalized anxiety disorder: Secondary | ICD-10-CM

## 2020-05-19 ENCOUNTER — Ambulatory Visit: Payer: Medicare Other | Admitting: Family Medicine

## 2020-05-31 ENCOUNTER — Other Ambulatory Visit: Payer: Self-pay

## 2020-05-31 ENCOUNTER — Ambulatory Visit (INDEPENDENT_AMBULATORY_CARE_PROVIDER_SITE_OTHER): Payer: Medicare Other | Admitting: Nurse Practitioner

## 2020-05-31 ENCOUNTER — Encounter: Payer: Self-pay | Admitting: Nurse Practitioner

## 2020-05-31 VITALS — BP 134/72 | HR 71 | Temp 98.0°F | Resp 20 | Ht 63.0 in | Wt 145.0 lb

## 2020-05-31 DIAGNOSIS — R42 Dizziness and giddiness: Secondary | ICD-10-CM | POA: Diagnosis not present

## 2020-05-31 NOTE — Patient Instructions (Signed)

## 2020-05-31 NOTE — Progress Notes (Signed)
   Subjective:    Patient ID: April [name], female    DOB: 1932-05-12, 86 y.o.   MRN: [id number]   Chief Complaint: recheck meds  HPI Patient comes in with her neice. She has been having some memory problems and Dr. Darlyn Read she is currently on aricept and namenda. She is c/o feeling dizzy for awhile. Family odes not feel like the dizziness comes from medication. Dizziness is more when she gets up or moves quickly. She has had 2 falls in the year. Last fall was in NOvember of last year. She really needs a complete physical.   Review of Systems  Constitutional: Negative for diaphoresis.  Eyes: Negative for pain.  Respiratory: Negative for shortness of breath.   Cardiovascular: Negative for chest pain, palpitations and leg swelling.  Gastrointestinal: Negative for abdominal pain.  Endocrine: Negative for polydipsia.  Skin: Negative for rash.  Neurological: Negative for dizziness, weakness and headaches.  Hematological: Does not bruise/bleed easily.  All other systems reviewed and are negative.      Objective:   Physical Exam Vitals and nursing note reviewed.  Constitutional:      Appearance: Normal appearance.  Cardiovascular:     Rate and Rhythm: Normal rate and regular rhythm.     Heart sounds: Normal heart sounds.  Pulmonary:     Breath sounds: Normal breath sounds.  Skin:    General: Skin is warm.  Neurological:     General: No focal deficit present.     Mental Status: She is alert and oriented to person, place, and time.     Cranial Nerves: No cranial nerve deficit.     Sensory: No sensory deficit.    BP 134/72   Pulse 71   Temp 98 F (36.7 C) (Temporal)   Resp 20   Ht 5\' 3"  (1.6 m)   Wt 145 lb (65.8 kg)   SpO2 94%   BMI 25.69 kg/m     BP 134/72   Pulse 71   Temp 98 F (36.7 C) (Temporal)   Resp 20   Ht 5\' 3"  (1.6 m)   Wt 145 lb (65.8 kg)   SpO2 94%   BMI 25.69 kg/m      Assessment & Plan:  Dust in today with chief complaint of Recheck  meds   1. Dizziness Rise slowly form sitting to standing Force fluids Follow up in 1 month   The above assessment and management plan was discussed with the patient. The patient verbalized understanding of and has agreed to the management plan. Patient is aware to call the clinic if symptoms persist or worsen. Patient is aware when to return to the clinic for a follow-up visit. Patient educated on when it is appropriate to go to the emergency department.   April [name] , FNP

## 2020-06-01 ENCOUNTER — Other Ambulatory Visit: Payer: Self-pay | Admitting: Family Medicine

## 2020-06-01 DIAGNOSIS — F411 Generalized anxiety disorder: Secondary | ICD-10-CM

## 2020-06-06 ENCOUNTER — Telehealth: Payer: Self-pay | Admitting: Nurse Practitioner

## 2020-06-06 NOTE — Telephone Encounter (Signed)
Pt states she is tickled to death to be back seeing you. She seen you with daughter in-law in room and could not discuss stuff with you. Asking to only speak with you.

## 2020-06-28 ENCOUNTER — Other Ambulatory Visit: Payer: Self-pay | Admitting: Family Medicine

## 2020-06-28 DIAGNOSIS — F411 Generalized anxiety disorder: Secondary | ICD-10-CM

## 2020-07-06 ENCOUNTER — Other Ambulatory Visit: Payer: Self-pay | Admitting: Family Medicine

## 2020-07-06 DIAGNOSIS — G301 Alzheimer's disease with late onset: Secondary | ICD-10-CM

## 2020-07-07 ENCOUNTER — Encounter: Payer: Self-pay | Admitting: Nurse Practitioner

## 2020-07-07 ENCOUNTER — Ambulatory Visit (INDEPENDENT_AMBULATORY_CARE_PROVIDER_SITE_OTHER): Payer: Medicare Other | Admitting: Nurse Practitioner

## 2020-07-07 ENCOUNTER — Ambulatory Visit: Payer: Self-pay | Admitting: Family Medicine

## 2020-07-07 ENCOUNTER — Other Ambulatory Visit: Payer: Self-pay

## 2020-07-07 VITALS — BP 146/72 | HR 65 | Temp 98.1°F | Resp 20 | Ht 63.0 in | Wt 144.0 lb

## 2020-07-07 DIAGNOSIS — E039 Hypothyroidism, unspecified: Secondary | ICD-10-CM

## 2020-07-07 DIAGNOSIS — I10 Essential (primary) hypertension: Secondary | ICD-10-CM | POA: Diagnosis not present

## 2020-07-07 DIAGNOSIS — F0280 Dementia in other diseases classified elsewhere without behavioral disturbance: Secondary | ICD-10-CM

## 2020-07-07 DIAGNOSIS — K5790 Diverticulosis of intestine, part unspecified, without perforation or abscess without bleeding: Secondary | ICD-10-CM | POA: Diagnosis not present

## 2020-07-07 DIAGNOSIS — G5793 Unspecified mononeuropathy of bilateral lower limbs: Secondary | ICD-10-CM

## 2020-07-07 DIAGNOSIS — G301 Alzheimer's disease with late onset: Secondary | ICD-10-CM

## 2020-07-07 DIAGNOSIS — F5101 Primary insomnia: Secondary | ICD-10-CM

## 2020-07-07 DIAGNOSIS — E785 Hyperlipidemia, unspecified: Secondary | ICD-10-CM | POA: Diagnosis not present

## 2020-07-07 DIAGNOSIS — F411 Generalized anxiety disorder: Secondary | ICD-10-CM

## 2020-07-07 MED ORDER — MOEXIPRIL HCL 15 MG PO TABS: 15.0000 mg | ORAL_TABLET | Freq: Every day | ORAL | 1 refills | Status: DC

## 2020-07-07 MED ORDER — LEVOTHYROXINE SODIUM 75 MCG PO TABS: 75.0000 ug | ORAL_TABLET | Freq: Every day | ORAL | 1 refills | Status: DC

## 2020-07-07 MED ORDER — SERTRALINE HCL 50 MG PO TABS: 50.0000 mg | ORAL_TABLET | Freq: Every day | ORAL | 1 refills | Status: DC

## 2020-07-07 MED ORDER — MEMANTINE HCL ER 21 MG PO CP24: ORAL_CAPSULE | ORAL | 1 refills | Status: DC

## 2020-07-07 MED ORDER — DONEPEZIL HCL 10 MG PO TABS: 10.0000 mg | ORAL_TABLET | Freq: Every day | ORAL | 1 refills | Status: DC

## 2020-07-07 NOTE — Progress Notes (Signed)
Subjective:    Patient ID: April [name], female    DOB: Mar 07, 1932, 86 y.o.   MRN: [id number]   Chief Complaint: Medical Management of Chronic Issues    HPI:  1. Essential hypertension No c/o chest pain, sob or headache. Does not check blood pressure at home.  BP Readings from Last 3 Encounters:  05/31/20 134/72  04/06/20 136/77  02/24/20 123/60     2. Hyperlipidemia with target LDL less than 100 Does not watch diet and does little to no exercise. April [name] does not take any statins. Lab Results  Component Value Date   CHOL 237 (H) 04/06/2020   HDL 37 (L) 04/06/2020   LDLCALC 136 (H) 04/06/2020   TRIG 350 (H) 04/06/2020   CHOLHDL 6.4 (H) 04/06/2020     3. Diverticulosis No recent flare ups- April [name] watches April [name] diet to prevent flare up  4. Acquired hypothyroidism No problems that April [name] is aware of Lab Results  Component Value Date   TSH 1.260 04/06/2020     5. Neuropathy of both feet Has constant burning in bottoms of feet.  6. Late onset Alzheimer's disease without behavioral disturbance (April [name]) April [name] says April [name] is doing fine. April [name] is still able to drive. April [name] is on namenda and aricept  7. Anxiety state Has occasional anxiety. Is on zolofot and that works well for April [name] GAD 7 : Generalized Anxiety Score 07/07/2020 10/21/2019  Nervous, Anxious, on Edge 1 -  Control/stop worrying 1 0  Worry too much - different things 0 0  Trouble relaxing 0 1  Restless 0 2  Easily annoyed or irritable 1 1  Afraid - awful might happen 0 1  Total GAD 7 Score 3 -  Anxiety Difficulty Not difficult at all Not difficult at all      9. Primary insomnia No problems sleeping as of late. Sleeps 7-8 hours a night.    Outpatient Encounter Medications as of 07/07/2020  Medication Sig   aspirin 81 MG tablet Take 81 mg by mouth daily.     Cholecalciferol (VITAMIN D3) 2000 UNITS TABS Take 1 tablet by mouth daily.     donepezil (ARICEPT) 10 MG tablet Take 1 tablet (10 mg total) by mouth at  bedtime.   ibuprofen (ADVIL,MOTRIN) 600 MG tablet Take 1 tablet (600 mg total) by mouth 2 (two) times daily.   levothyroxine (SYNTHROID) 75 MCG tablet Take 1 tablet (75 mcg total) by mouth daily before breakfast.   Melatonin 3 MG TABS Take by mouth.   memantine (NAMENDA XR) 21 MG CP24 24 hr capsule TAKE (1) CAPSULE DAILY   moexipril (UNIVASC) 15 MG tablet Take 1 tablet (15 mg total) by mouth at bedtime.   sertraline (ZOLOFT) 50 MG tablet TAKE 1 TABLET DAILY     Past Surgical History:  Procedure Laterality Date   FOOT SURGERY     Arthritis    HAND SURGERY     Arthritis    VAGINAL HYSTERECTOMY      Family History  Problem Relation Age of Onset   Pneumonia Father    Alcohol abuse Father    Dementia Sister     New complaints: None today  Social history: April [name] son lives with April [name]  Controlled substance contract: n/a    Review of Systems  Constitutional: Negative for diaphoresis.  Eyes: Negative for pain.  Respiratory: Negative for shortness of breath.   Cardiovascular: Negative for chest pain, palpitations and leg swelling.  Gastrointestinal: Negative for abdominal pain.  Endocrine: Negative for  polydipsia.  Skin: Negative for rash.  Neurological: Negative for dizziness, weakness and headaches.  Hematological: Does not bruise/bleed easily.  All other systems reviewed and are negative.      Objective:   Physical Exam Vitals and nursing note reviewed.  Constitutional:      General: April [name] is not in acute distress.    Appearance: Normal appearance. April [name] is well-developed.  HENT:     Head: Normocephalic.     Nose: Nose normal.  Eyes:     Pupils: Pupils are equal, round, and reactive to light.  Neck:     Vascular: No carotid bruit or JVD.  Cardiovascular:     Rate and Rhythm: Normal rate and regular rhythm.     Heart sounds: Normal heart sounds.  Pulmonary:     Effort: Pulmonary effort is normal. No respiratory distress.     Breath sounds: Normal breath  sounds. No wheezing or rales.  Chest:     Chest wall: No tenderness.  Abdominal:     General: Bowel sounds are normal. There is no distension or abdominal bruit.     Palpations: Abdomen is soft. There is no hepatomegaly, splenomegaly, mass or pulsatile mass.     Tenderness: There is no abdominal tenderness.  Musculoskeletal:        General: Normal range of motion.     Cervical back: Normal range of motion and neck supple.  Lymphadenopathy:     Cervical: No cervical adenopathy.  Skin:    General: Skin is warm and dry.  Neurological:     Mental Status: April [name] is alert and oriented to person, place, and time.     Deep Tendon Reflexes: Reflexes are normal and symmetric.  Psychiatric:        Behavior: Behavior normal.        Thought Content: Thought content normal.        Judgment: Judgment normal.     BP (!) 146/72    Pulse 65    Temp 98.1 F (36.7 C) (Temporal)    Resp 20    Ht 5' 3"  (1.6 m)    Wt 144 lb (65.3 kg)    SpO2 96%    BMI 25.51 kg/m        Assessment & Plan:  April [name] comes in today with chief complaint of Medical Management of Chronic Issues   Diagnosis and orders addressed:  1. Essential hypertension Low sodium diet - moexipril (UNIVASC) 15 MG tablet; Take 1 tablet (15 mg total) by mouth at bedtime.  Dispense: 90 tablet; Refill: 1 - CBC with Differential/Platelet - CMP14+EGFR  2. Hyperlipidemia with target LDL less than 100 Low sodium diet - Lipid panel  3. Diverticulosis Watch food sthat hav esmall seeds Avoid popcorn  4. Acquired hypothyroidism - levothyroxine (SYNTHROID) 75 MCG tablet; Take 1 tablet (75 mcg total) by mouth daily before breakfast.  Dispense: 90 tablet; Refill: 1 - Thyroid Panel With TSH  5. Neuropathy of both feet Do noty go barefooted  6. Late onset Alzheimer's disease without behavioral disturbance ([name]) Play word game sto exercise brain daily - donepezil (ARICEPT) 10 MG tablet; Take 1 tablet (10 mg total) by mouth at  bedtime.  Dispense: 90 tablet; Refill: 1 - memantine (NAMENDA XR) 21 MG CP24 24 hr capsule; TAKE (1) CAPSULE DAILY  Dispense: 90 capsule; Refill: 1  7. Anxiety state Stress ,management\  8. GAD (generalized anxiety disorder) - sertraline (ZOLOFT) 50 MG tablet; Take 1 tablet (50 mg total) by mouth  daily.  Dispense: 90 tablet; Refill: 1  9. Primary insomnia Bedtime routine   Labs pending Health Maintenance reviewed Diet and exercise encouraged  Follow up plan: 6 months   [place], FNP

## 2020-07-07 NOTE — Patient Instructions (Signed)
Alzheimer's Disease Alzheimer's disease is a brain disease that affects memory, thinking, language, and behavior. People with Alzheimer's disease lose mental abilities, and the disease gets worse over time. Alzheimer's disease is a form of dementia. What are the causes? This condition develops when a protein called beta-amyloid forms deposits in the brain. It is not known what causes these deposits to form. Alzheimer's disease may also be caused by a gene mutation that is inherited from one parent or both parents. A gene mutation is a harmful change in a gene. Not everyone who inherits the genetic mutation will get the disease. What increases the risk? You are more likely to develop this condition if you:  Are older than age 65.  Have a family history of dementia.  Have had a brain injury.  Have heart or blood vessel disease.  Have had a stroke.  Have high blood pressure or high cholesterol.  Have diabetes. What are the signs or symptoms? Symptoms of this condition may happen in three stages, which often overlap. Early stage In this stage, you may continue to be independent. You may still be able to drive, work, and be social. Symptoms in this stage include:  Minor memory problems, such as forgetting a name or what you read.  Difficulty with: ? Paying attention. ? Communicating. ? Doing familiar tasks. ? Problem solving or doing calculations. ? Following instructions. ? Learning new things.  Anxiety.  Social withdrawal.  Loss of motivation. Moderate stage In this stage, you will start to need care. Symptoms in this stage include:  Difficulty with expressing thoughts.  Memory loss that affects daily life. This can include forgetting: ? Your address or phone number. ? Recent events that have happened. ? Parts of your personal history, such as where you went to school.  Confusion about where you are or what time it is.  Difficulty in judging distance.  Changes in  personality, mood, and behavior. You may be moody, irritable, angry, frustrated, fearful, anxious, or suspicious.  Poor reasoning and judgment.  Delusions or hallucinations.  Changes in sleep patterns.  Wandering and getting lost, even in familiar places. Severe stage In the final stage, you will need help with your personal care and daily activities. Symptoms in this stage include:  Worsening memory loss.  Personality changes.  Loss of awareness of your surroundings.  Changes in physical abilities, including the ability to walk, sit, and swallow.  Difficulty in communicating.  Inability to control your bladder and bowels.  Increasing confusion.  Increasing behavior changes. How is this diagnosed? This condition is diagnosed by a health care provider who specializes in diseases of the nervous system (neurologist). Other causes of dementia may also be ruled out. Your health care provider will talk with you and your family, friends, or caregivers about your history and symptoms. A thorough medical history will be taken, and you will have a physical exam and tests. Tests may include:  Lab tests, such as blood or urine tests.  Imaging tests, such as a CT scan, a PET scan, or an MRI.  A lumbar puncture. This test involves removing and testing a small amount of the fluid that surrounds the brain and spinal cord.  An electroencephalogram (EEG). In this test, small metal discs are used to measure electrical activity in the brain.  Memory tests, cognitive tests, and neuropsychological tests. These tests evaluate brain function.  Genetic testing may be done if you have early onset of the disease (before age 60) or if   other family members have the disease. How is this treated? At this time, there is no treatment to cure Alzheimer's disease or stop it from getting worse. The goals of treatment are:  To slow down symptoms of the disease, if possible.  To manage behavioral  changes.  To provide you with a safe environment.  To help manage daily life for you and your caregivers. The following treatment options are available:  Medicines. Medicines may help to slow down memory loss and manage behavioral symptoms.  Cognitive therapy. Cognitive therapy provides you with education, support, and memory aids. It is most helpful in the early stages of the condition.  Counseling or spiritual guidance. It is normal to have a lot of feelings, including anger, relief, fear, and isolation. Counseling and guidance can help you deal with these feelings.  Caregiving. This involves having caregivers help you with your daily activities.  Family support groups. These provide education, emotional support, and information about community resources to family members who are taking care of you. Follow these instructions at home:  Medicines  Take over-the-counter and prescription medicines only as told by your health care provider.  Use a pill organizer or pill reminder to help you manage your medicines.  Avoid taking medicines that can affect thinking, such as pain medicines or sleeping medicines. Lifestyle  Make healthy lifestyle choices: ? Be physically active as told by your health care provider. Regular exercise may help improve symptoms. ? Do not use any products that contain nicotine or tobacco, such as cigarettes, e-cigarettes, and chewing tobacco. If you need help quitting, ask your health care provider. ? Do not drink alcohol. ? Eat a healthy diet. ? Practice stress-management techniques when you get stressed. ? Stay social.  Drink enough fluid to keep your urine pale yellow.  Make sure to get quality sleep. ? Avoid taking long naps during the day. Take short naps of 30 minutes or less if needed. ? Keep your sleeping area dark and cool. ? Avoid exercising during the few hours before you go to bed. ? Avoid caffeine products in the afternoon and  evening. General instructions  Work with your health care provider to determine what you need help with and what your safety needs are.  If you were given a bracelet that identifies you as a person with memory loss or tracks your location, make sure to wear it at all times.  Talk with your health care provider about whether it is safe for you to drive.  Work with your family to make important decisions, such as advance directives, medical power of attorney, or a living will.  Keep all follow-up visits as told by your health care provider. This is important. Where to find more information  The Alzheimer's Association: Call the 24-hour helpline at 1-800-272-3900, or visit www.alz.org Contact a health care provider if:  You have nausea, vomiting, or trouble with eating.  You have dizziness or weakness.  You or your family members become concerned for your safety. Get help right away if:  You feel depressed or sad, or feel that you want to harm yourself.  You develop chest pain or difficulty with breathing.  You pass out. If you ever feel like you may hurt yourself or others, or have thoughts about taking your own life, get help right away. You can go to your nearest emergency department or call:  Your local emergency services (911 in the [place]  A suicide crisis helpline, such as the National Suicide Prevention   Lifeline at 1-800-273-8255. This is open 24 hours a day. Summary  Alzheimer's disease is a brain disease that affects memory, thinking, language, and behavior. Alzheimer's disease is a form of dementia.  This condition is diagnosed by a specialist in diseases of the nervous system (neurologist).  At this time, there is no treatment to cure Alzheimer's disease or stop it from getting worse. The goals of treatment are to slow memory loss and help you manage any symptoms.  Work with your family to make important decisions, such as advance directives, medical power of  attorney, or a living will. This information is not intended to replace advice given to you by your health care provider. Make sure you discuss any questions you have with your health care provider. Document Revised: 11/04/2018 Document Reviewed: 11/04/2018 Elsevier Patient Education  2020 [employer] Inc[employer]  

## 2020-07-08 LAB — CBC WITH DIFFERENTIAL/PLATELET
Basophils Absolute: 0.1 10*3/uL (ref 0.0–0.2)
Basos: 1 %
EOS (ABSOLUTE): 0.2 10*3/uL (ref 0.0–0.4)
Eos: 3 %
Hematocrit: 41.1 % (ref 34.0–46.6)
Hemoglobin: 13.5 g/dL (ref 11.1–15.9)
Immature Grans (Abs): 0 10*3/uL (ref 0.0–0.1)
Immature Granulocytes: 1 %
Lymphocytes Absolute: 2.3 10*3/uL (ref 0.7–3.1)
Lymphs: 31 %
MCH: 30.7 pg (ref 26.6–33.0)
MCHC: 32.8 g/dL (ref 31.5–35.7)
MCV: 93 fL (ref 79–97)
Monocytes Absolute: 0.8 10*3/uL (ref 0.1–0.9)
Monocytes: 11 %
Neutrophils Absolute: 3.9 10*3/uL (ref 1.4–7.0)
Neutrophils: 53 %
Platelets: 381 10*3/uL (ref 150–450)
RBC: 4.4 x10E6/uL (ref 3.77–5.28)
RDW: 14.5 % (ref 11.7–15.4)
WBC: 7.3 10*3/uL (ref 3.4–10.8)

## 2020-07-08 LAB — CMP14+EGFR
ALT: 13 IU/L (ref 0–32)
AST: 19 IU/L (ref 0–40)
Albumin/Globulin Ratio: 1.8 (ref 1.2–2.2)
Albumin: 4.4 g/dL (ref 3.6–4.6)
Alkaline Phosphatase: 85 IU/L (ref 48–121)
BUN/Creatinine Ratio: 27 (ref 12–28)
BUN: 20 mg/dL (ref 8–27)
Bilirubin Total: 0.4 mg/dL (ref 0.0–1.2)
CO2: 26 mmol/L (ref 20–29)
Calcium: 9 mg/dL (ref 8.7–10.3)
Chloride: 104 mmol/L (ref 96–106)
Creatinine, Ser: 0.75 mg/dL (ref 0.57–1.00)
GFR calc Af Amer: 83 mL/min/{1.73_m2} (ref >59)
GFR calc non Af Amer: 72 mL/min/{1.73_m2} (ref >59)
Globulin, Total: 2.4 g/dL (ref 1.5–4.5)
Glucose: 99 mg/dL (ref 65–99)
Potassium: 4 mmol/L (ref 3.5–5.2)
Sodium: 141 mmol/L (ref 134–144)
Total Protein: 6.8 g/dL (ref 6.0–8.5)

## 2020-07-08 LAB — LIPID PANEL
Chol/HDL Ratio: 6 ratio — ABNORMAL HIGH (ref 0.0–4.4)
Cholesterol, Total: 287 mg/dL — ABNORMAL HIGH (ref 100–199)
HDL: 48 mg/dL (ref >39)
LDL Chol Calc (NIH): 198 mg/dL — ABNORMAL HIGH (ref 0–99)
Triglycerides: 211 mg/dL — ABNORMAL HIGH (ref 0–149)
VLDL Cholesterol Cal: 41 mg/dL — ABNORMAL HIGH (ref 5–40)

## 2020-07-08 LAB — THYROID PANEL WITH TSH
Free Thyroxine Index: 1.5 (ref 1.2–4.9)
T3 Uptake Ratio: 27 % (ref 24–39)
T4, Total: 5.7 ug/dL (ref 4.5–12.0)
TSH: 2.37 u[IU]/mL (ref 0.450–4.500)

## 2020-07-21 ENCOUNTER — Other Ambulatory Visit: Payer: Self-pay

## 2020-07-21 ENCOUNTER — Encounter: Payer: Self-pay | Admitting: Nurse Practitioner

## 2020-07-21 ENCOUNTER — Ambulatory Visit (INDEPENDENT_AMBULATORY_CARE_PROVIDER_SITE_OTHER): Payer: Medicare Other | Admitting: Nurse Practitioner

## 2020-07-21 VITALS — Ht 63.0 in

## 2020-07-21 DIAGNOSIS — S61012A Laceration without foreign body of left thumb without damage to nail, initial encounter: Secondary | ICD-10-CM | POA: Insufficient documentation

## 2020-07-21 NOTE — Assessment & Plan Note (Signed)
Patient is a 86 year old female who presents to clinic with a laceration of her left thumb without foreign body without damage to the nail.  Patient reports she fell but cannot explain how it happened, patient reports trying to break the fall and fell to the ground.  Patient has laceration at the interphalangeal thumb joint.  Laceration is bleeding with slight bone showing.  Patient reports 0 out of 10 pain from a scale of 0-10.  Advised patient to go to the ER, x-rays will have to be done, with stitches.  Laceration cleansed and dressed with normal saline, and wrapped with 4 x 4. Patient follow-up with worsening or unresolved symptoms.

## 2020-07-21 NOTE — Progress Notes (Signed)
Acute Office Visit  Subjective:    Patient ID: April [name], female    DOB: [birth year], 86 y.o.   MRN: [id number]  Chief Complaint  Patient presents with   Fall    HPI Patient is in today for laceration of left thumb without foreign body without damage to nail.  Patient reports she fell but cannot explain how it happened or remembered any prior incident that led to the fall.  Patient reports trying to break the fall and fell to the ground.  Patient has laceration at the interphalangeal thumb joint.  Laceration is bleeding with slight bone showing.  Patient is reporting 0 out of 10 pain from a scale of 0-10.  Past Medical History:  Diagnosis Date   Arthritis    Cognitive changes    Hyperlipidemia    Hypertension    Thyroid disease     Past Surgical History:  Procedure Laterality Date   FOOT SURGERY     Arthritis    HAND SURGERY     Arthritis    VAGINAL HYSTERECTOMY      Family History  Problem Relation Age of Onset   Pneumonia Father    Alcohol abuse Father    Dementia Sister     Social History   Socioeconomic History   Marital status: Widowed    Spouse name: Not on file   Number of children: 2   Years of education: 84   Highest education level: 12th grade  Occupational History   Occupation: retired  Tobacco Use   Smoking status: Former Smoker   Smokeless tobacco: Never Used  Substance and Sexual Activity   Alcohol use: No    Alcohol/week: 1.0 standard drink    Types: 1 Glasses of wine per week    Comment: socially   Drug use: No   Sexual activity: Not on file  Other Topics Concern   Not on file  Social History Narrative   Lives at home with husband   Right-handed   Caffeine use: Drinks green tea/ soda and coffee once in a while    Social Determinants of Corporate investment banker Strain:    Difficulty of Paying Living Expenses:   Food Insecurity:    Worried About Programme researcher, broadcasting/film/video in the Last Year:    Engineer, site in the Last Year:   Transportation Needs:    Freight forwarder (Medical):    Lack of Transportation (Non-Medical):   Physical Activity:    Days of Exercise per Week:    Minutes of Exercise per Session:   Stress:    Feeling of Stress :   Social Connections:    Frequency of Communication with Friends and Family:    Frequency of Social Gatherings with Friends and Family:    Attends Religious Services:    Active Member of Clubs or Organizations:    Attends Engineer, structural:    Marital Status:   Intimate Partner Violence:    Fear of Current or Ex-Partner:    Emotionally Abused:    Physically Abused:    Sexually Abused:     Outpatient Medications Prior to Visit  Medication Sig Dispense Refill   aspirin 81 MG tablet Take 81 mg by mouth daily.       Cholecalciferol (VITAMIN D3) 2000 UNITS TABS Take 1 tablet by mouth daily.       donepezil (ARICEPT) 10 MG tablet Take 1 tablet (10 mg total) by mouth at  bedtime. 90 tablet 1   ibuprofen (ADVIL,MOTRIN) 600 MG tablet Take 1 tablet (600 mg total) by mouth 2 (two) times daily. 60 tablet 2   levothyroxine (SYNTHROID) 75 MCG tablet Take 1 tablet (75 mcg total) by mouth daily before breakfast. 90 tablet 1   Melatonin 3 MG TABS Take by mouth.     memantine (NAMENDA XR) 21 MG CP24 24 hr capsule TAKE (1) CAPSULE DAILY 90 capsule 1   moexipril (UNIVASC) 15 MG tablet Take 1 tablet (15 mg total) by mouth at bedtime. 90 tablet 1   sertraline (ZOLOFT) 50 MG tablet Take 1 tablet (50 mg total) by mouth daily. 90 tablet 1   No facility-administered medications prior to visit.    Allergies  Allergen Reactions   Acid Reducer [Cimetidine] Nausea Only   Mevacor [Lovastatin] Nausea Only    Review of Systems  Respiratory: Negative.   Cardiovascular: Negative.   Gastrointestinal: Negative.   Genitourinary: Negative.   Musculoskeletal: Positive for joint swelling.       Laceration interphalangeal thumb  joint  Skin: Positive for color change and wound.  Neurological: Negative for weakness, light-headedness and headaches.  Psychiatric/Behavioral: The patient is not nervous/anxious.        Objective:    Physical Exam Constitutional:      Appearance: Normal appearance.  HENT:     Head: Normocephalic.  Eyes:     Conjunctiva/sclera: Conjunctivae normal.  Cardiovascular:     Rate and Rhythm: Normal rate and regular rhythm.     Pulses: Normal pulses.     Heart sounds: Normal heart sounds.  Pulmonary:     Effort: Pulmonary effort is normal.     Breath sounds: Normal breath sounds.  Abdominal:     General: Bowel sounds are normal.     Tenderness: There is no abdominal tenderness.  Musculoskeletal:        General: Tenderness and signs of injury present.     Comments: Laceration interphalangeal thumb joint.  Skin:    General: Skin is warm.     Findings: Erythema present.  Neurological:     Mental Status: She is alert.     Ht 5\' 3"  (1.6 m)    BMI 25.51 kg/m  Wt Readings from Last 3 Encounters:  07/07/20 144 lb (65.3 kg)  05/31/20 145 lb (65.8 kg)  04/06/20 145 lb (65.8 kg)    Health Maintenance Due  Topic Date Due   INFLUENZA VACCINE  07/10/2020      Lab Results  Component Value Date   TSH 2.370 07/07/2020   Lab Results  Component Value Date   WBC 7.3 07/07/2020   HGB 13.5 07/07/2020   HCT 41.1 07/07/2020   MCV 93 07/07/2020   PLT 381 07/07/2020   Lab Results  Component Value Date   NA 141 07/07/2020   K 4.0 07/07/2020   CO2 26 07/07/2020   GLUCOSE 99 07/07/2020   BUN 20 07/07/2020   CREATININE 0.75 07/07/2020   BILITOT 0.4 07/07/2020   ALKPHOS 85 07/07/2020   AST 19 07/07/2020   ALT 13 07/07/2020   PROT 6.8 07/07/2020   ALBUMIN 4.4 07/07/2020   CALCIUM 9.0 07/07/2020   Lab Results  Component Value Date   CHOL 287 (H) 07/07/2020   Lab Results  Component Value Date   HDL 48 07/07/2020   Lab Results  Component Value Date   LDLCALC 198 (H)  07/07/2020   Lab Results  Component Value Date   TRIG 211 (H)  07/07/2020   Lab Results  Component Value Date   CHOLHDL 6.0 (H) 07/07/2020       Assessment & Plan:   Problem List Items Addressed This Visit      Other   Laceration of left thumb without foreign body without damage to nail - Primary    Patient is a 86 year old female who presents to clinic with a laceration of her left thumb without foreign body without damage to the nail.  Patient reports she fell but cannot explain how it happened, patient reports trying to break the fall and fell to the ground.  Patient has laceration at the interphalangeal thumb joint.  Laceration is bleeding with slight bone showing.  Patient reports 0 out of 10 pain from a scale of 0-10.  Advised patient to go to the ER, x-rays will have to be done, with stitches.  Laceration cleansed and dressed with normal saline, and wrapped with 4 x 4. Patient follow-up with worsening or unresolved symptoms.            Daryll Drown, NP

## 2020-07-21 NOTE — Patient Instructions (Addendum)
Laceration of left thumb without foreign body without damage to nail Patient is a 86 year old female who presents to clinic with a laceration of her left thumb without foreign body without damage to the nail.  Patient reports she fell but cannot explain how it happened, patient reports trying to break the fall and fell to the ground.  Patient has laceration at the interphalangeal thumb joint.  Laceration is bleeding with slight bone showing.  Patient reports 0 out of 10 pain from a scale of 0-10.  Advised patient to go to the ER, x-rays will have to be done, with stitches.  Laceration cleansed and dressed with normal saline, and wrapped with 4 x 4. Patient follow-up with worsening or unresolved symptoms.   Laceration Care, Adult A laceration is a cut that may go through all layers of the skin. The cut may also go into the tissue that is right under the skin. Some cuts heal on their own. Others need to be closed with stitches (sutures), staples, skin adhesive strips, or skin glue. Taking care of your injury lowers your risk of infection, helps your injury to heal better, and may prevent scarring. Supplies needed:  Soap.  Water.  Hand sanitizer.  Bandage (dressing).  Antibiotic ointment.  Clean towel. How to take care of your cut Wash your hands with soap and water before touching your wound or changing your bandage. If soap and water are not available, use hand sanitizer. If your doctor used stitches or staples:  Keep the wound clean and dry.  If you were given a bandage, change it at least once a day as told by your doctor. You should also change it if it gets wet or dirty.  Keep the wound completely dry for the first 24 hours, or as told by your doctor. After that, you may take a shower or a bath. Do not get the wound soaked in water until after the stitches or staples have been removed.  Clean the wound once a day, or as told by your doctor: ? Wash the wound with soap and  water. ? Rinse the wound with water to remove all soap. ? Pat the wound dry with a clean towel. Do not rub the wound.  After you clean the wound, put a thin layer of antibiotic ointment on it as told by your doctor. This ointment: ? Helps to prevent infection. ? Keeps the bandage from sticking to the wound.  Have your stitches or staples removed as told by your doctor. If your doctor used skin adhesive strips:  Keep the wound clean and dry.  If you were given a bandage, you should change it at least once a day as told by your doctor. You should also change it if it gets wet or dirty.  Do not get the skin adhesive strips wet. You can take a shower or a bath, but keep the wound dry.  If the wound gets wet, pat it dry with a clean towel. Do not rub the wound.  Skin adhesive strips fall off on their own. You can trim the strips as the wound heals. Do not remove any strips that are still stuck to the wound. They will fall off after a while. If your doctor used skin glue:  Try to keep your wound dry, but you may briefly wet it in the shower or bath. Do not soak the wound in water, such as by swimming.  After you take a shower or a bath, gently  pat the wound dry with a clean towel. Do not rub the wound.  Do not do any activities that will make you really sweaty until the skin glue has fallen off on its own.  Do not apply liquid, cream, or ointment medicine to your wound while the skin glue is still on.  If you were given a bandage, you should change it at least once a day or as told by your doctor. You should also change it if it gets dirty or wet.  If a bandage is placed over the wound, do not let the tape touch the skin glue.  Do not pick at the glue. The skin glue usually stays on for 5-10 days. Then, it falls off the skin. General instructions   Take over-the-counter and prescription medicines only as told by your doctor.  If you were given antibiotic medicine or ointment, take  or apply it as told by your doctor. Do not stop using it even if your condition improves.  Do not scratch or pick at the wound.  Check your wound every day for signs of infection. Watch for: ? Redness, swelling, or pain. ? Fluid, blood, or pus.  Raise (elevate) the injured area above the level of your heart while you are sitting or lying down.  If directed, put ice on the affected area: ? Put ice in a plastic bag. ? Place a towel between your skin and the bag. ? Leave the ice on for 20 minutes, 2-3 times a day.  Prevent scarring by covering your wound with sunscreen of at least 30 SPF whenever you are outside after your wound has healed.  Keep all follow-up visits as told by your doctor. This is important. Get help if:  You got a tetanus shot and you have any of these problems at the injection site: ? Swelling. ? Very bad pain. ? Redness. ? Bleeding.  You have a fever.  A wound that was closed breaks open.  You notice a bad smell coming from your wound or your bandage.  You notice something coming out of the wound, such as wood or glass.  Medicine does not relieve your pain.  You have more redness, swelling, or pain at the site of your wound.  You have fluid, blood, or pus coming from your wound.  You notice a change in the color of your skin near your wound.  You need to change the bandage often because fluid, blood, or pus is coming from the wound.  You start to have a new rash.  You start to have numbness around the wound. Get help right away if:  You have very bad swelling around the wound.  Your pain suddenly gets worse and is very bad.  You notice painful lumps near the wound or anywhere on your body.  You have a red streak going away from your wound.  The wound is on your hand or foot, and: ? You cannot move a finger or toe. ? Your fingers or toes look pale or bluish. Summary  A laceration is a cut that may go through all layers of the skin. The  cut may also go into the tissue right under the skin.  Some cuts heal on their own. Others need to be closed with stitches, staples, skin adhesive strips, or skin glue.  Follow your doctor's instructions for caring for your cut. Proper care of a cut lowers the risk of infection, helps the cut heal better, and prevents scarring. This  information is not intended to replace advice given to you by your health care provider. Make sure you discuss any questions you have with your health care provider. Document Revised: 01/24/2018 Document Reviewed: 12/16/2017 Elsevier Patient Education  2020 [employer].

## 2020-08-01 DIAGNOSIS — S63125A Dislocation of unspecified interphalangeal joint of left thumb, initial encounter: Secondary | ICD-10-CM | POA: Insufficient documentation

## 2020-08-02 ENCOUNTER — Ambulatory Visit (INDEPENDENT_AMBULATORY_CARE_PROVIDER_SITE_OTHER): Payer: Medicare Other | Admitting: Nurse Practitioner

## 2020-08-02 ENCOUNTER — Other Ambulatory Visit: Payer: Self-pay

## 2020-08-02 ENCOUNTER — Encounter: Payer: Self-pay | Admitting: Nurse Practitioner

## 2020-08-02 VITALS — BP 114/65 | HR 65 | Temp 99.0°F | Resp 20 | Ht 63.0 in | Wt 141.0 lb

## 2020-08-02 DIAGNOSIS — I10 Essential (primary) hypertension: Secondary | ICD-10-CM

## 2020-08-02 DIAGNOSIS — F0280 Dementia in other diseases classified elsewhere without behavioral disturbance: Secondary | ICD-10-CM

## 2020-08-02 DIAGNOSIS — G301 Alzheimer's disease with late onset: Secondary | ICD-10-CM | POA: Diagnosis not present

## 2020-08-02 NOTE — Assessment & Plan Note (Signed)
Patient is a 86 year old female who presents for follow-up dementia.  She is here for evaluation and treatment of cognitive problems.  She is accompanied by her son.  Patient is having concerns that she is not getting her dementia medication because she noticed that her short-term memory is getting worse.  Completed the MMSE patient scored a 25 out of 30.  Patient was unable to recognize her Namenda and Aricept in the pillbox.  Provided reassurance and education to patient and caregiver about the changes that occurs with dementia.  Patient is on 10 mg Aricept by mouth at bedtime and 21 mg Namenda daily.  Patient is reporting medication is not effective.  She is not reporting any side effect to adverse effect from medication.  Printed education handout provided to patient.  With reassurance.   Looking over patient's pillbox pillbox contained Aricept and Namenda at the right time of administration.  Follow-up as needed

## 2020-08-02 NOTE — Assessment & Plan Note (Signed)
Patient presents for follow-up hypertension, patient was diagnosed in 08/26/2013.  Patient is tolerating medication well without side effects.  Compliance with treatment has been good including taking medication as directed and maintains a healthy diet.  Patien follows up as directed.Marland Kitchen  No changes to medication dose today.  Current medication is Univasc 50 mg daily at bedtime.   Follow-up as needed.

## 2020-08-02 NOTE — Progress Notes (Signed)
Established Patient Office Visit  Subjective:  Patient ID: April [name], female    DOB: 02-Dec-1932  Age: 86 y.o. MRN: [id number]  CC:  Chief Complaint  Patient presents with   Dementia    HPI April [name] presents for follow up dementia Dementia: She is here for evaluation and treatment of cognitive problems. She is accompanied by  son. Primary caregiver is patient's son. The family and the patient identify problems with changes in short term memory. Family and patient report problems with agitation. Family and patient are concerned about  medication errors, Medication administration: patient self medicates   Functional Assessment:  Activities of Daily Living (ADLs):   She is independent in the following: ambulation, bathing and hygiene, feeding and continence Requires assistance with the following: no assisstant  Instrumental Activities of Daily Living (IADLs):   She is independent in the following: bathing, cooking,    Pt presents for follow up of hypertension. Patient was diagnosed in 08/26/2013. The patient is tolerating the medication well without side effects. Compliance with treatment has been good; including taking as directed , maintains a healthy diet, and following up as directed.  Current medication Univasc 50 mg daily at bedtime  Past Medical History:  Diagnosis Date   Arthritis    Cognitive changes    Hyperlipidemia    Hypertension    Thyroid disease     Past Surgical History:  Procedure Laterality Date   FOOT SURGERY     Arthritis    HAND SURGERY     Arthritis    thumb surgery Left    [facility]    VAGINAL HYSTERECTOMY      Family History  Problem Relation Age of Onset   Pneumonia Father    Alcohol abuse Father    Dementia Sister     Social History   Socioeconomic History   Marital status: Widowed    Spouse name: Not on file   Number of children: 2   Years of education: 55   Highest education level: 12th grade    Occupational History   Occupation: retired  Tobacco Use   Smoking status: Former Smoker   Smokeless tobacco: Never Used  Substance and Sexual Activity   Alcohol use: No    Alcohol/week: 1.0 standard drink    Types: 1 Glasses of wine per week    Comment: socially   Drug use: No   Sexual activity: Not on file  Other Topics Concern   Not on file  Social History Narrative   Lives at home with husband   Right-handed   Caffeine use: Drinks green tea/ soda and coffee once in a while    Social Determinants of Health                                                                         Outpatient Medications Prior to Visit  Medication Sig Dispense Refill   aspirin 81 MG tablet Take 81 mg by mouth daily.       Cholecalciferol (VITAMIN D3) 2000 UNITS TABS Take 1 tablet by mouth daily.       donepezil (ARICEPT) 10 MG tablet Take 1 tablet (10 mg total) by mouth at bedtime. 90 tablet 1  ibuprofen (ADVIL,MOTRIN) 600 MG tablet Take 1 tablet (600 mg total) by mouth 2 (two) times daily. 60 tablet 2   levothyroxine (SYNTHROID) 75 MCG tablet Take 1 tablet (75 mcg total) by mouth daily before breakfast. 90 tablet 1   Melatonin 3 MG TABS Take by mouth.     memantine (NAMENDA XR) 21 MG CP24 24 hr capsule TAKE (1) CAPSULE DAILY 90 capsule 1   moexipril (UNIVASC) 15 MG tablet Take 1 tablet (15 mg total) by mouth at bedtime. 90 tablet 1   sertraline (ZOLOFT) 50 MG tablet Take 1 tablet (50 mg total) by mouth daily. 90 tablet 1   No facility-administered medications prior to visit.    Allergies  Allergen Reactions   Acid Reducer [Cimetidine] Nausea Only   Mevacor [Lovastatin] Nausea Only    ROS Review of Systems  Constitutional: Negative.   HENT: Negative.   Eyes: Negative.   Respiratory: Negative.   Cardiovascular: Negative.   Musculoskeletal: Positive for joint swelling.       Left wrist from a recent fall  Skin: Negative.   Neurological:  Negative for tremors.       Short term memory loss  Psychiatric/Behavioral: The patient is nervous/anxious.       Objective:    Physical Exam Constitutional:      Appearance: Normal appearance.  HENT:     Head: Normocephalic.  Eyes:     Conjunctiva/sclera: Conjunctivae normal.  Cardiovascular:     Rate and Rhythm: Normal rate and regular rhythm.     Pulses: Normal pulses.     Heart sounds: Normal heart sounds.  Abdominal:     General: Bowel sounds are normal.  Skin:    General: Skin is dry.     Findings: No rash.  Neurological:     Mental Status: She is alert.     Comments: Increase short term memory loss  Psychiatric:        Mood and Affect: Mood normal.     BP 114/65    Pulse 65    Temp 99 F (37.2 C)    Resp 20    Ht 5\' 3"  (1.6 m)    Wt 141 lb (64 kg)    SpO2 95%    BMI 24.98 kg/m  Wt Readings from Last 3 Encounters:  08/02/20 141 lb (64 kg)  07/07/20 144 lb (65.3 kg)  05/31/20 145 lb (65.8 kg)     Health Maintenance Due  Topic Date Due   INFLUENZA VACCINE  07/10/2020      Lab Results  Component Value Date   TSH 2.370 07/07/2020   Lab Results  Component Value Date   WBC 7.3 07/07/2020   HGB 13.5 07/07/2020   HCT 41.1 07/07/2020   MCV 93 07/07/2020   PLT 381 07/07/2020   Lab Results  Component Value Date   NA 141 07/07/2020   K 4.0 07/07/2020   CO2 26 07/07/2020   GLUCOSE 99 07/07/2020   BUN 20 07/07/2020   CREATININE 0.75 07/07/2020   BILITOT 0.4 07/07/2020   ALKPHOS 85 07/07/2020   AST 19 07/07/2020   ALT 13 07/07/2020   PROT 6.8 07/07/2020   ALBUMIN 4.4 07/07/2020   CALCIUM 9.0 07/07/2020   Lab Results  Component Value Date   CHOL 287 (H) 07/07/2020   Lab Results  Component Value Date   HDL 48 07/07/2020   Lab Results  Component Value Date   LDLCALC 198 (H) 07/07/2020   Lab Results  Component  Value Date   TRIG 211 (H) 07/07/2020   Lab Results  Component Value Date   CHOLHDL 6.0 (H) 07/07/2020   No results found  for: HGBA1C    Assessment & Plan:   Problem List Items Addressed This Visit      Cardiovascular and Mediastinum   Hypertension    Patient presents for follow-up hypertension, patient was diagnosed in 08/26/2013.  Patient is tolerating medication well without side effects.  Compliance with treatment has been good including taking medication as directed and maintains a healthy diet.  Patien follows up as directed.Marland Kitchen  No changes to medication dose today.  Current medication is Univasc 50 mg daily at bedtime.   Follow-up as needed.        Nervous and Auditory   Late onset Alzheimer's disease without behavioral disturbance [facility]) - Primary    Patient is a 86 year old female who presents for follow-up dementia.  She is here for evaluation and treatment of cognitive problems.  She is accompanied by her son.  Patient is having concerns that she is not getting her dementia medication because she noticed that her short-term memory is getting worse.  Completed the MMSE patient scored a 25 out of 30.  Patient was unable to recognize her Namenda and Aricept in the pillbox.  Provided reassurance and education to patient and caregiver about the changes that occurs with dementia.  Patient is on 10 mg Aricept by mouth at bedtime and 21 mg Namenda daily.  Patient is reporting medication is not effective.  She is not reporting any side effect to adverse effect from medication.  Printed education handout provided to patient.  With reassurance.   Looking over patient's pillbox pillbox contained Aricept and Namenda at the right time of administration.  Follow-up as needed         No orders of the defined types were placed in this encounter.   Follow-up: Return if symptoms worsen or fail to improve.    Daryll Drown, NP

## 2020-08-02 NOTE — Patient Instructions (Addendum)
Hypertension Patient presents for follow-up hypertension, patient was diagnosed in 08/26/2013.  Patient is tolerating medication well without side effects.  Compliance with treatment has been good including taking medication as directed and maintains a healthy diet.  Patien follows up as directed.Marland Kitchen  No changes to medication dose today.  Current medication is Univasc 50 mg daily at bedtime.   Follow-up as needed.  Late onset Alzheimer's disease without behavioral disturbance [facility]) Patient is a 86 year old female who presents for follow-up dementia.  She is here for evaluation and treatment of cognitive problems.  She is accompanied by her son.  Patient is having concerns that she is not getting her dementia medication because she noticed that her short-term memory is getting worse.  Completed the MMSE patient scored a 25 out of 30.  Patient was unable to recognize her Namenda and Aricept in the pillbox.  Provided reassurance and education to patient and caregiver about the changes that occurs with dementia.  Patient is on 10 mg Aricept by mouth at bedtime and 21 mg Namenda daily.  Patient is reporting medication is not effective.  She is not reporting any side effect to adverse effect from medication.  Printed education handout provided to patient.  With reassurance.   Looking over patient's pillbox pillbox contained Aricept and Namenda at the right time of administration.  Follow-up as needed    Dementia Dementia is a condition that affects the way the brain functions. It often affects memory and thinking. Usually, dementia gets worse with time and cannot be reversed (progressive dementia). There are many types of dementia, including:  Alzheimer's disease. This type is the most common.  Vascular dementia. This type may happen as the result of a stroke.  Lewy body dementia. This type may happen to people who have Parkinson's disease.  Frontotemporal dementia. This type is caused by damage to  nerve cells (neurons) in certain parts of the brain. Some people may be affected by more than one type of dementia. This is called mixed dementia. What are the causes? Dementia is caused by damage to cells in the brain. The area of the brain and the types of cells damaged determine the type of dementia. Usually, this damage is irreversible or cannot be undone. Some examples of irreversible causes include:  Conditions that affect the blood vessels of the brain, such as diabetes, heart disease, or blood vessel disease.  Genetic mutations. In some cases, changes in the brain may be caused by another condition and can be reversed or slowed. Some examples of reversible causes include:  Injury to the brain.  Certain medicines.  Infection, such as meningitis.  Metabolic problems, such as vitamin B12 deficiency or thyroid disease.  Pressure on the brain, such as from a tumor or blood clot. What are the signs or symptoms? Symptoms of dementia depend on the type of dementia. Common signs of dementia include problems with remembering, thinking, problem solving, decision making, and communicating. These signs develop slowly or get worse with time. This may include:  Problems remembering things.  Having trouble taking a bath or putting clothes on.  Forgetting appointments.  Forgetting to pay bills.  Difficulty planning and preparing meals.  Having trouble speaking.  Getting lost easily. How is this diagnosed? This condition is diagnosed by a specialist (neurologist). It is diagnosed based on the history of your symptoms, your medical history, a physical exam, and tests. Tests may include:  Tests to evaluate brain function, such as memory tests, cognitive tests, and other  tests.  Lab tests, such as blood or urine tests.  Imaging tests, such as a CT scan, a PET scan, or an MRI.  Genetic testing. This may be done if other family members have a diagnosis of certain types of dementia. Your  health care provider will talk with you and your family, friends, or caregivers about your history and symptoms. How is this treated?  Treatment for this condition depends on the cause of the dementia. Progressive dementias, such as Alzheimer's disease, cannot be cured, but there may be treatments that help to manage symptoms. Treatment might involve taking medicines that may help to:  Control the dementia.  Slow down the progression of the dementia.  Manage symptoms. In some cases, treating the cause of your dementia can improve symptoms, reverse symptoms, or slow down how quickly your dementia becomes worse. Your health care provider can direct you to support groups, organizations, and other health care providers who can help with decisions about your care. Follow these instructions at home: Medicines  Take over-the-counter and prescription medicines only as told by your health care provider.  Use a pill organizer or pill reminder to help you manage your medicines.  Avoid taking medicines that can affect thinking, such as pain medicines or sleeping medicines. Lifestyle  Make healthy lifestyle choices. ? Be physically active as told by your health care provider. ? Do not use any products that contain nicotine or tobacco, such as cigarettes, e-cigarettes, and chewing tobacco. If you need help quitting, ask your health care provider. ? Do not drink alcohol. ? Practice stress-management techniques when you get stressed. ? Spend time with other people.  Make sure to get quality sleep. These tips can help you get a good night's rest: ? Avoid napping during the day. ? Keep your sleeping area dark and cool. ? Avoid exercising during the few hours before you go to bed. ? Avoid caffeine products in the evening. Eating and drinking  Drink enough fluid to keep your urine pale yellow.  Eat a healthy diet. General instructions   Work with your health care provider to determine what you  need help with and what your safety needs are.  Talk with your health care provider about whether it is safe for you to drive.  If you were given a bracelet that identifies you as a person with memory loss or tracks your location, make sure to wear it at all times.  Work with your family to make important decisions, such as advance directives, medical power of attorney, or a living will.  Keep all follow-up visits as told by your health care provider. This is important. Where to find more information  Alzheimer's Association: LimitLaws.hu  [employer] on Aging: CashCowGambling.be  World Health Organization: https://castaneda-walker.com/ Contact a health care provider if:  You have any new or worsening symptoms.  You have problems with choking or swallowing. Get help right away if:  You feel depressed or sad, or feel that you want to harm yourself.  Your family members become concerned for your safety. If you ever feel like you may hurt yourself or others, or have thoughts about taking your own life, get help right away. You can go to your nearest emergency department or call:  Your local emergency services (911 in the [place]  A suicide crisis helpline, such as the National Suicide Prevention Lifeline at (651) 519-5948. This is open 24 hours a day. Summary  Dementia is a condition that affects the way the brain functions.  Dementia often affects memory and thinking.  Usually, dementia gets worse with time and cannot be reversed (progressive dementia).  Treatment for this condition depends on the cause of the dementia.  Work with your health care provider to determine what you need help with and what your safety needs are.  Your health care provider can direct you to support groups, organizations, and other health care providers who can help with decisions about your care. This information is not intended to replace advice given to you by your health care provider. Make sure you  discuss any questions you have with your health care provider. Document Revised: 02/10/2019 Document Reviewed: 02/10/2019 Elsevier Patient Education  2020 [employer].

## 2021-01-09 ENCOUNTER — Ambulatory Visit (INDEPENDENT_AMBULATORY_CARE_PROVIDER_SITE_OTHER): Payer: Medicare Other | Admitting: Nurse Practitioner

## 2021-01-09 ENCOUNTER — Other Ambulatory Visit: Payer: Self-pay

## 2021-01-09 ENCOUNTER — Encounter: Payer: Self-pay | Admitting: Nurse Practitioner

## 2021-01-09 VITALS — BP 132/80 | HR 64 | Temp 97.6°F | Resp 20 | Ht 63.0 in | Wt 139.0 lb

## 2021-01-09 DIAGNOSIS — G301 Alzheimer's disease with late onset: Secondary | ICD-10-CM | POA: Diagnosis not present

## 2021-01-09 DIAGNOSIS — E785 Hyperlipidemia, unspecified: Secondary | ICD-10-CM

## 2021-01-09 DIAGNOSIS — F411 Generalized anxiety disorder: Secondary | ICD-10-CM

## 2021-01-09 DIAGNOSIS — F5101 Primary insomnia: Secondary | ICD-10-CM | POA: Diagnosis not present

## 2021-01-09 DIAGNOSIS — K5790 Diverticulosis of intestine, part unspecified, without perforation or abscess without bleeding: Secondary | ICD-10-CM

## 2021-01-09 DIAGNOSIS — F0280 Dementia in other diseases classified elsewhere without behavioral disturbance: Secondary | ICD-10-CM

## 2021-01-09 DIAGNOSIS — I10 Essential (primary) hypertension: Secondary | ICD-10-CM | POA: Diagnosis not present

## 2021-01-09 DIAGNOSIS — E039 Hypothyroidism, unspecified: Secondary | ICD-10-CM | POA: Diagnosis not present

## 2021-01-09 MED ORDER — MEMANTINE HCL ER 21 MG PO CP24: ORAL_CAPSULE | ORAL | 1 refills | Status: DC

## 2021-01-09 MED ORDER — SERTRALINE HCL 50 MG PO TABS: 50.0000 mg | ORAL_TABLET | Freq: Every day | ORAL | 1 refills | Status: DC

## 2021-01-09 MED ORDER — MOEXIPRIL HCL 15 MG PO TABS: 15.0000 mg | ORAL_TABLET | Freq: Every day | ORAL | 1 refills | Status: DC

## 2021-01-09 MED ORDER — LEVOTHYROXINE SODIUM 75 MCG PO TABS: 75.0000 ug | ORAL_TABLET | Freq: Every day | ORAL | 1 refills | Status: DC

## 2021-01-09 MED ORDER — DONEPEZIL HCL 10 MG PO TABS: 10.0000 mg | ORAL_TABLET | Freq: Every day | ORAL | 1 refills | Status: DC

## 2021-01-09 NOTE — Progress Notes (Signed)
Subjective:    Patient ID: April [name], female    DOB: October 05, 1932, 87 y.o.   MRN: [id number]   Chief Complaint: medical management of chronic issues      HPI:  1. Primary hypertension No c/o chest pain, sob or headache. Does not check blood pressure at home. BP Readings from Last 3 Encounters:  08/02/20 114/65  07/07/20 (!) 146/72  05/31/20 134/72    2. Hyperlipidemia with target LDL less than 100 Does not watch diet and does no dedicated exercise. Refuses choleserol meds Lab Results  Component Value Date   CHOL 287 (H) 07/07/2020   HDL 48 07/07/2020   LDLCALC 198 (H) 07/07/2020   TRIG 211 (H) 07/07/2020   CHOLHDL 6.0 (H) 07/07/2020     3. Acquired hypothyroidism No problems that she is aware of. Lab Results  Component Value Date   TSH 2.370 07/07/2020     4. Late onset Alzheimer's disease without behavioral disturbance (April [name]) Is currently on aricept and namenda. She thinks she is better since her husband passes away. Her son lives with her, so she is never alone.  5. Diverticulosis No recent flare ups  6. GAD (generalized anxiety disorder) Is on zoloft daily and has been for several years. GAD 7 : Generalized Anxiety Score 01/09/2021 07/07/2020 10/21/2019  Nervous, Anxious, on Edge 1 1 -  Control/stop worrying 1 1 0  Worry too much - different things 0 0 0  Trouble relaxing 0 0 1  Restless 0 0 2  Easily annoyed or irritable 0 1 1  Afraid - awful might happen 0 0 1  Total GAD 7 Score 2 3 -  Anxiety Difficulty - Not difficult at all Not difficult at all      7. Primary insomnia takes melatonin to sleep at night and is doing ok. Sleep s about 6-7 hours a night.    Outpatient Encounter Medications as of 01/09/2021  Medication Sig  . aspirin 81 MG tablet Take 81 mg by mouth daily.    . Cholecalciferol (VITAMIN D3) 2000 UNITS TABS Take 1 tablet by mouth daily.    Marland Kitchen donepezil (ARICEPT) 10 MG tablet Take 1 tablet (10 mg total) by mouth at bedtime.  Marland Kitchen  ibuprofen (ADVIL,MOTRIN) 600 MG tablet Take 1 tablet (600 mg total) by mouth 2 (two) times daily.  Marland Kitchen levothyroxine (SYNTHROID) 75 MCG tablet Take 1 tablet (75 mcg total) by mouth daily before breakfast.  . Melatonin 3 MG TABS Take by mouth.  . memantine (NAMENDA XR) 21 MG CP24 24 hr capsule TAKE (1) CAPSULE DAILY  . moexipril (UNIVASC) 15 MG tablet Take 1 tablet (15 mg total) by mouth at bedtime.  . sertraline (ZOLOFT) 50 MG tablet Take 1 tablet (50 mg total) by mouth daily.    Past Surgical History:  Procedure Laterality Date  . FOOT SURGERY     Arthritis   . HAND SURGERY     Arthritis   . thumb surgery Left    [facility]   . VAGINAL HYSTERECTOMY      Family History  Problem Relation Age of Onset  . Pneumonia Father   . Alcohol abuse Father   . Dementia Sister     New complaints: None today  Social history: Her son lives with ehr  Controlled substance contract: n/a    Review of Systems  Constitutional: Negative for diaphoresis.  Eyes: Negative for pain.  Respiratory: Negative for shortness of breath.   Cardiovascular: Negative for chest pain,  palpitations and leg swelling.  Gastrointestinal: Negative for abdominal pain.  Endocrine: Negative for polydipsia.  Skin: Negative for rash.  Neurological: Negative for dizziness, weakness and headaches.  Hematological: Does not bruise/bleed easily.  All other systems reviewed and are negative.      Objective:   Physical Exam Vitals and nursing note reviewed.  Constitutional:      General: She is not in acute distress.    Appearance: Normal appearance. She is well-developed and well-nourished.  HENT:     Head: Normocephalic.     Nose: Nose normal.     Mouth/Throat:     Mouth: Oropharynx is clear and moist.  Eyes:     Extraocular Movements: EOM normal.     Pupils: Pupils are equal, round, and reactive to light.  Neck:     Vascular: No carotid bruit or JVD.  Cardiovascular:     Rate and Rhythm: Normal rate  and regular rhythm.     Pulses: Intact distal pulses.     Heart sounds: Normal heart sounds.  Pulmonary:     Effort: Pulmonary effort is normal. No respiratory distress.     Breath sounds: Normal breath sounds. No wheezing or rales.  Chest:     Chest wall: No tenderness.  Abdominal:     General: Bowel sounds are normal. There is no distension or abdominal bruit. Aorta is normal.     Palpations: Abdomen is soft. There is no hepatomegaly, splenomegaly, mass or pulsatile mass.     Tenderness: There is no abdominal tenderness.  Musculoskeletal:        General: No edema. Normal range of motion.     Cervical back: Normal range of motion and neck supple.  Lymphadenopathy:     Cervical: No cervical adenopathy.  Skin:    General: Skin is warm and dry.  Neurological:     Mental Status: She is alert and oriented to person, place, and time.     Deep Tendon Reflexes: Reflexes are normal and symmetric.  Psychiatric:        Mood and Affect: Mood and affect normal.        Behavior: Behavior normal.        Thought Content: Thought content normal.        Judgment: Judgment normal.     BP 132/80   Pulse 64   Temp 97.6 F (36.4 C) (Temporal)   Resp 20   Ht _0  (1.6 m)   Wt 139 lb (63 kg)   SpO2 93%   BMI 24.62 kg/m           Assessment & Plan:  April [name] comes in today with chief complaint of Medical Management of Chronic Issues   Diagnosis and orders addressed:  1. Primary hypertension Low sodium diet - moexipril (UNIVASC) 15 MG tablet; Take 1 tablet (15 mg total) by mouth at bedtime.  Dispense: 90 tablet; Refill: 1 - CBC with Differential/Platelet - CMP14+EGFR  2. Hyperlipidemia with target LDL less than 100 Low fat diet - Lipid panel  3. Acquired hypothyroidism Labs pending - levothyroxine (SYNTHROID) 75 MCG tablet; Take 1 tablet (75 mcg total) by mouth daily before breakfast.  Dispense: 90 tablet; Refill: 1 - Thyroid Panel With TSH  4. Late onset Alzheimer's  disease without behavioral disturbance (April [name]) Alert and oriented today - donepezil (ARICEPT) 10 MG tablet; Take 1 tablet (10 mg total) by mouth at bedtime.  Dispense: 90 tablet; Refill: 1 - memantine (NAMENDA XR) 21 MG CP24  24 hr capsule; TAKE (1) CAPSULE DAILY  Dispense: 90 capsule; Refill: 1  5. Diverticulosis Watch diet to prevent flare up  6. GAD (generalized anxiety disorder) Stress management - sertraline (ZOLOFT) 50 MG tablet; Take 1 tablet (50 mg total) by mouth daily.  Dispense: 90 tablet; Refill: 1  7. Primary insomnia Bedtime routine Continue ,elatonin OTC   Labs pending Health Maintenance reviewed Diet and exercise encouraged  Follow up plan: 6 months   [place], FNP

## 2021-01-09 NOTE — Patient Instructions (Signed)
Textbook of family medicine (9th ed., pp. 1062-1073). [place], [place]: [employer].">  Stress, Adult Stress is a normal reaction to life events. Stress is what you feel when life demands more than you are used to, or more than you think you can handle. Some stress can be useful, such as studying for a test or meeting a deadline at work. Stress that occurs too often or for too long can cause problems. It can affect your emotional health and interfere with relationships and normal daily activities. Too much stress can weaken your body's defense system (immune system) and increase your risk for physical illness. If you already have a medical problem, stress can make it worse. What are the causes? All sorts of life events can cause stress. An event that causes stress for one person may not be stressful for another person. Major life events, whether positive or negative, commonly cause stress. Examples include:  Losing a job or starting a new job.  Losing a loved one.  Moving to a new town or home.  Getting married or divorced.  Having a baby.  Getting injured or sick. Less obvious life events can also cause stress, especially if they occur day after day or in combination with each other. Examples include:  Working long hours.  Driving in traffic.  Caring for children.  Being in debt.  Being in a difficult relationship. What are the signs or symptoms? Stress can cause emotional symptoms, including:  Anxiety. This is feeling worried, afraid, on edge, overwhelmed, or out of control.  Anger, including irritation or impatience.  Depression. This is feeling sad, down, helpless, or guilty.  Trouble focusing, remembering, or making decisions. Stress can cause physical symptoms, including:  Aches and pains. These may affect your head, neck, back, stomach, or other areas of your body.  Tight muscles or a clenched jaw.  Low energy.  Trouble sleeping. Stress can cause unhealthy  behaviors, including:  Eating to feel better (overeating) or skipping meals.  Working too much or putting off tasks.  Smoking, drinking alcohol, or using drugs to feel better. How is this diagnosed? Stress is diagnosed through an assessment by your health care provider. He or she may diagnose this condition based on:  Your symptoms and any stressful life events.  Your medical history.  Tests to rule out other causes of your symptoms. Depending on your condition, your health care provider may refer you to a specialist for further evaluation. How is this treated? Stress management techniques are the recommended treatment for stress. Medicine is not typically recommended for the treatment of stress. Techniques to reduce your reaction to stressful life events include:  Stress identification. Monitor yourself for symptoms of stress and identify what causes stress for you. These skills may help you to avoid or prepare for stressful events.  Time management. Set your priorities, keep a calendar of events, and learn to say no. Taking these actions can help you avoid making too many commitments. Techniques for coping with stress include:  Rethinking the problem. Try to think realistically about stressful events rather than ignoring them or overreacting. Try to find the positives in a stressful situation rather than focusing on the negatives.  Exercise. Physical exercise can release both physical and emotional tension. The key is to find a form of exercise that you enjoy and do it regularly.  Relaxation techniques. These relax the body and mind. The key is to find one or more that you enjoy and use the techniques regularly. Examples include: ?   Meditation, deep breathing, or progressive relaxation techniques. ? Yoga or tai chi. ? Biofeedback, mindfulness techniques, or journaling. ? Listening to music, being out in nature, or participating in other hobbies.  Practicing a healthy lifestyle.  Eat a balanced diet, drink plenty of water, limit or avoid caffeine, and get plenty of sleep.  Having a strong support network. Spend time with family, friends, or other people you enjoy being around. Express your feelings and talk things over with someone you trust. Counseling or talk therapy with a mental health professional may be helpful if you are having trouble managing stress on your own.   Follow these instructions at home: Lifestyle  Avoid drugs.  Do not use any products that contain nicotine or tobacco, such as cigarettes, e-cigarettes, and chewing tobacco. If you need help quitting, ask your health care provider.  Limit alcohol intake to no more than 1 drink a day for nonpregnant women and 2 drinks a day for men. One drink equals 12 oz of beer, 5 oz of wine, or 1 oz of hard liquor  Do not use alcohol or drugs to relax.  Eat a balanced diet that includes fresh fruits and vegetables, whole grains, lean meats, fish, eggs, and beans, and low-fat dairy. Avoid processed foods and foods high in added fat, sugar, and salt.  Exercise at least 30 minutes on 5 or more days each week.  Get 7-8 hours of sleep each night.   General instructions  Practice stress management techniques as discussed with your health care provider.  Drink enough fluid to keep your urine clear or pale yellow.  Take over-the-counter and prescription medicines only as told by your health care provider.  Keep all follow-up visits as told by your health care provider. This is important.   Contact a health care provider if:  Your symptoms get worse.  You have new symptoms.  You feel overwhelmed by your problems and can no longer manage them on your own. Get help right away if:  You have thoughts of hurting yourself or others. If you ever feel like you may hurt yourself or others, or have thoughts about taking your own life, get help right away. You can go to your nearest emergency department or  call:  Your local emergency services (911 in the [place]  A suicide crisis helpline, such as the National Suicide Prevention Lifeline at 1-800-273-8255. This is open 24 hours a day. Summary  Stress is a normal reaction to life events. It can cause problems if it happens too often or for too long.  Practicing stress management techniques is the best way to treat stress.  Counseling or talk therapy with a mental health professional may be helpful if you are having trouble managing stress on your own. This information is not intended to replace advice given to you by your health care provider. Make sure you discuss any questions you have with your health care provider. Document Revised: 08/12/2020 Document Reviewed: 08/12/2020 Elsevier Patient Education  2021 Elsevier Inc.  

## 2021-01-10 LAB — CBC WITH DIFFERENTIAL/PLATELET
Basophils Absolute: 0.1 10*3/uL (ref 0.0–0.2)
Basos: 1 %
EOS (ABSOLUTE): 0.1 10*3/uL (ref 0.0–0.4)
Eos: 2 %
Hematocrit: 43.8 % (ref 34.0–46.6)
Hemoglobin: 14.4 g/dL (ref 11.1–15.9)
Immature Grans (Abs): 0 10*3/uL (ref 0.0–0.1)
Immature Granulocytes: 1 %
Lymphocytes Absolute: 2.1 10*3/uL (ref 0.7–3.1)
Lymphs: 32 %
MCH: 30.8 pg (ref 26.6–33.0)
MCHC: 32.9 g/dL (ref 31.5–35.7)
MCV: 94 fL (ref 79–97)
Monocytes Absolute: 0.7 10*3/uL (ref 0.1–0.9)
Monocytes: 10 %
Neutrophils Absolute: 3.5 10*3/uL (ref 1.4–7.0)
Neutrophils: 54 %
Platelets: 391 10*3/uL (ref 150–450)
RBC: 4.67 x10E6/uL (ref 3.77–5.28)
RDW: 12.8 % (ref 11.7–15.4)
WBC: 6.5 10*3/uL (ref 3.4–10.8)

## 2021-01-10 LAB — CMP14+EGFR
ALT: 12 IU/L (ref 0–32)
AST: 20 IU/L (ref 0–40)
Albumin/Globulin Ratio: 1.9 (ref 1.2–2.2)
Albumin: 4.4 g/dL (ref 3.6–4.6)
Alkaline Phosphatase: 85 IU/L (ref 44–121)
BUN/Creatinine Ratio: 24 (ref 12–28)
BUN: 19 mg/dL (ref 8–27)
Bilirubin Total: 0.5 mg/dL (ref 0.0–1.2)
CO2: 26 mmol/L (ref 20–29)
Calcium: 9.5 mg/dL (ref 8.7–10.3)
Chloride: 104 mmol/L (ref 96–106)
Creatinine, Ser: 0.8 mg/dL (ref 0.57–1.00)
GFR calc Af Amer: 77 mL/min/{1.73_m2} (ref >59)
GFR calc non Af Amer: 67 mL/min/{1.73_m2} (ref >59)
Globulin, Total: 2.3 g/dL (ref 1.5–4.5)
Glucose: 86 mg/dL (ref 65–99)
Potassium: 4.4 mmol/L (ref 3.5–5.2)
Sodium: 143 mmol/L (ref 134–144)
Total Protein: 6.7 g/dL (ref 6.0–8.5)

## 2021-01-10 LAB — LIPID PANEL
Chol/HDL Ratio: 5.3 ratio — ABNORMAL HIGH (ref 0.0–4.4)
Cholesterol, Total: 273 mg/dL — ABNORMAL HIGH (ref 100–199)
HDL: 52 mg/dL (ref >39)
LDL Chol Calc (NIH): 183 mg/dL — ABNORMAL HIGH (ref 0–99)
Triglycerides: 203 mg/dL — ABNORMAL HIGH (ref 0–149)
VLDL Cholesterol Cal: 38 mg/dL (ref 5–40)

## 2021-01-10 LAB — THYROID PANEL WITH TSH
Free Thyroxine Index: 1.8 (ref 1.2–4.9)
T3 Uptake Ratio: 27 % (ref 24–39)
T4, Total: 6.6 ug/dL (ref 4.5–12.0)
TSH: 4.3 u[IU]/mL (ref 0.450–4.500)

## 2021-01-31 DIAGNOSIS — L97511 Non-pressure chronic ulcer of other part of right foot limited to breakdown of skin: Secondary | ICD-10-CM | POA: Diagnosis not present

## 2021-01-31 DIAGNOSIS — M79674 Pain in right toe(s): Secondary | ICD-10-CM | POA: Diagnosis not present

## 2021-02-06 ENCOUNTER — Encounter: Payer: Self-pay | Admitting: *Deleted

## 2021-02-14 DIAGNOSIS — L97511 Non-pressure chronic ulcer of other part of right foot limited to breakdown of skin: Secondary | ICD-10-CM | POA: Diagnosis not present

## 2021-02-15 ENCOUNTER — Other Ambulatory Visit: Payer: Self-pay | Admitting: Family Medicine

## 2021-02-15 DIAGNOSIS — I10 Essential (primary) hypertension: Secondary | ICD-10-CM

## 2021-02-23 ENCOUNTER — Other Ambulatory Visit: Payer: Self-pay | Admitting: Family Medicine

## 2021-02-23 DIAGNOSIS — I10 Essential (primary) hypertension: Secondary | ICD-10-CM

## 2021-03-02 ENCOUNTER — Other Ambulatory Visit: Payer: Self-pay | Admitting: Family Medicine

## 2021-03-02 DIAGNOSIS — I10 Essential (primary) hypertension: Secondary | ICD-10-CM

## 2021-03-02 DIAGNOSIS — E039 Hypothyroidism, unspecified: Secondary | ICD-10-CM

## 2021-03-03 ENCOUNTER — Ambulatory Visit (INDEPENDENT_AMBULATORY_CARE_PROVIDER_SITE_OTHER): Payer: Medicare Other

## 2021-03-03 ENCOUNTER — Other Ambulatory Visit: Payer: Self-pay

## 2021-03-03 ENCOUNTER — Ambulatory Visit (INDEPENDENT_AMBULATORY_CARE_PROVIDER_SITE_OTHER): Payer: Medicare Other | Admitting: Family Medicine

## 2021-03-03 ENCOUNTER — Encounter: Payer: Self-pay | Admitting: Family Medicine

## 2021-03-03 VITALS — BP 121/69 | HR 63 | Temp 97.0°F | Ht 63.0 in | Wt 140.0 lb

## 2021-03-03 DIAGNOSIS — S0990XA Unspecified injury of head, initial encounter: Secondary | ICD-10-CM

## 2021-03-03 DIAGNOSIS — R0781 Pleurodynia: Secondary | ICD-10-CM

## 2021-03-03 DIAGNOSIS — S0993XA Unspecified injury of face, initial encounter: Secondary | ICD-10-CM | POA: Diagnosis not present

## 2021-03-03 DIAGNOSIS — W19XXXA Unspecified fall, initial encounter: Secondary | ICD-10-CM | POA: Diagnosis not present

## 2021-03-03 DIAGNOSIS — R220 Localized swelling, mass and lump, head: Secondary | ICD-10-CM | POA: Diagnosis not present

## 2021-03-03 DIAGNOSIS — S0003XA Contusion of scalp, initial encounter: Secondary | ICD-10-CM | POA: Diagnosis not present

## 2021-03-03 DIAGNOSIS — Z7689 Persons encountering health services in other specified circumstances: Secondary | ICD-10-CM | POA: Diagnosis not present

## 2021-03-03 DIAGNOSIS — S0011XA Contusion of right eyelid and periocular area, initial encounter: Secondary | ICD-10-CM | POA: Diagnosis not present

## 2021-03-03 LAB — BMP8+EGFR
BUN/Creatinine Ratio: 16 (ref 12–28)
BUN: 13 mg/dL (ref 8–27)
CO2: 20 mmol/L (ref 20–29)
Calcium: 9.4 mg/dL (ref 8.7–10.3)
Chloride: 100 mmol/L (ref 96–106)
Creatinine, Ser: 0.82 mg/dL (ref 0.57–1.00)
Glucose: 113 mg/dL — ABNORMAL HIGH (ref 65–99)
Potassium: 4.3 mmol/L (ref 3.5–5.2)
Sodium: 140 mmol/L (ref 134–144)
eGFR: 70 mL/min/{1.73_m2} (ref >59)

## 2021-03-03 LAB — CBC WITH DIFFERENTIAL/PLATELET
Basophils Absolute: 0 10*3/uL (ref 0.0–0.2)
Basos: 1 %
EOS (ABSOLUTE): 0.1 10*3/uL (ref 0.0–0.4)
Eos: 2 %
Hematocrit: 43.3 % (ref 34.0–46.6)
Hemoglobin: 14.7 g/dL (ref 11.1–15.9)
Immature Grans (Abs): 0 10*3/uL (ref 0.0–0.1)
Immature Granulocytes: 1 %
Lymphocytes Absolute: 1.6 10*3/uL (ref 0.7–3.1)
Lymphs: 21 %
MCH: 31.5 pg (ref 26.6–33.0)
MCHC: 33.9 g/dL (ref 31.5–35.7)
MCV: 93 fL (ref 79–97)
Monocytes Absolute: 0.9 10*3/uL (ref 0.1–0.9)
Monocytes: 12 %
Neutrophils Absolute: 4.9 10*3/uL (ref 1.4–7.0)
Neutrophils: 63 %
Platelets: 370 10*3/uL (ref 150–450)
RBC: 4.67 x10E6/uL (ref 3.77–5.28)
RDW: 12.7 % (ref 11.7–15.4)
WBC: 7.7 10*3/uL (ref 3.4–10.8)

## 2021-03-03 NOTE — Progress Notes (Signed)
Assessment & Plan:  1. Fall on concrete Advised patient/son she should be in the ER. They do not wish to go and have asked that I order imaging from here. Unknown cause of fall.  - CT Head Wo Contrast; Future - CT Maxillofacial WO CM; Future - CBC with Differential/Platelet - BMP8+EGFR - DG Ribs Unilateral W/Chest Right  2. Traumatic injury of head, initial encounter - CT Head Wo Contrast; Future  3. Blunt trauma of face, initial encounter - CT Maxillofacial WO CM; Future - Ice to face.  4. Rib pain on right side - DG Ribs Unilateral W/Chest Right   Follow up plan: Return if symptoms worsen or fail to improve.  Hendricks Limes, MSN, APRN, FNP-C [facility]  Subjective:   Patient ID: April [name], female    DOB: Jan 13, 1932, 87 y.o.   MRN: [id number]  HPI: April [name] is a 87 y.o. female presenting on 03/03/2021 for Fall (Patient had a fall last night on her carport.  Complains of facial pain and pain under right breast. )  Patient is accompanied by her son who she is okay with being present.  Patient has a history of Alzheimer's disease and is not a good historian.  She fell on the cement concrete pad last night for unknown reasons.  Denies loss of consciousness.  Denies any other falls, feelings of off balance or unsteady, or use of assistive devices.  She denies any headaches.  Only reports mild pain under her right breast.   ROS: Negative unless specifically indicated above in HPI.   Relevant past medical history reviewed and updated as indicated.   Allergies and medications reviewed and updated.   Current Outpatient Medications:  .  aspirin 81 MG tablet, Take 81 mg by mouth daily., Disp: , Rfl:  .  Cholecalciferol (VITAMIN D3) 2000 UNITS TABS, Take 1 tablet by mouth daily., Disp: , Rfl:  .  donepezil (ARICEPT) 10 MG tablet, Take 1 tablet (10 mg total) by mouth at bedtime., Disp: 90 tablet, Rfl: 1 .  ibuprofen (ADVIL,MOTRIN) 600 MG tablet,  Take 1 tablet (600 mg total) by mouth 2 (two) times daily., Disp: 60 tablet, Rfl: 2 .  levothyroxine (SYNTHROID) 75 MCG tablet, TAKE (1) TABLET DAILY BE- FORE BREAKFAST., Disp: 90 tablet, Rfl: 1 .  Melatonin 3 MG TABS, Take by mouth., Disp: , Rfl:  .  memantine (NAMENDA XR) 21 MG CP24 24 hr capsule, TAKE (1) CAPSULE DAILY, Disp: 90 capsule, Rfl: 1 .  moexipril (UNIVASC) 15 MG tablet, Take 1 tablet (15 mg total) by mouth at bedtime., Disp: 90 tablet, Rfl: 1 .  sertraline (ZOLOFT) 50 MG tablet, Take 1 tablet (50 mg total) by mouth daily., Disp: 90 tablet, Rfl: 1  Allergies  Allergen Reactions  . Acid Reducer [Cimetidine] Nausea Only  . Mevacor [Lovastatin] Nausea Only    Objective:   BP 121/69   Pulse 63   Temp (!) 97 F (36.1 C) (Temporal)   Ht 5' 3"  (1.6 m)   Wt 140 lb (63.5 kg)   SpO2 97%   BMI 24.80 kg/m    Physical Exam Vitals reviewed.  Constitutional:      General: She is not in acute distress.    Appearance: Normal appearance. She is not ill-appearing, toxic-appearing or diaphoretic.  HENT:     Head: Normocephalic and atraumatic.      Comments: Hematoma on right side of forehead. Bruising and swelling to right side of face, cheek, and  around eye. Eyes:     General: No scleral icterus.       Right eye: No discharge.        Left eye: No discharge.     Conjunctiva/sclera: Conjunctivae normal.     Comments: Right eye completely swollen shut and ecchymotic.   Cardiovascular:     Rate and Rhythm: Normal rate.  Pulmonary:     Effort: Pulmonary effort is normal. No respiratory distress.  Musculoskeletal:        General: Normal range of motion.     Cervical back: Normal range of motion.  Skin:    General: Skin is warm and dry.     Capillary Refill: Capillary refill takes less than 2 seconds.  Neurological:     General: No focal deficit present.     Mental Status: She is alert and oriented to person, place, and time. Mental status is at baseline.  Psychiatric:         Mood and Affect: Mood normal.        Behavior: Behavior normal.        Thought Content: Thought content normal.        Judgment: Judgment normal.

## 2021-03-21 ENCOUNTER — Ambulatory Visit (INDEPENDENT_AMBULATORY_CARE_PROVIDER_SITE_OTHER): Payer: Medicare Other | Admitting: Nurse Practitioner

## 2021-03-21 ENCOUNTER — Encounter: Payer: Self-pay | Admitting: Nurse Practitioner

## 2021-03-21 ENCOUNTER — Other Ambulatory Visit: Payer: Self-pay

## 2021-03-21 VITALS — BP 142/83 | HR 77 | Temp 98.6°F | Resp 20 | Ht 63.0 in | Wt 138.0 lb

## 2021-03-21 DIAGNOSIS — M25512 Pain in left shoulder: Secondary | ICD-10-CM

## 2021-03-21 DIAGNOSIS — S0003XS Contusion of scalp, sequela: Secondary | ICD-10-CM

## 2021-03-21 DIAGNOSIS — S0003XD Contusion of scalp, subsequent encounter: Secondary | ICD-10-CM | POA: Diagnosis not present

## 2021-03-21 NOTE — Patient Instructions (Signed)
Contusion A contusion is a deep bruise. This is a result of an injury that causes bleeding under the skin. Symptoms of bruising include pain, swelling, and discolored skin. The skin may turn blue, purple, or yellow. Follow these instructions at home: Managing pain, stiffness, and swelling You may use RICE. This stands for:  Resting.  Icing.  Compression, or putting pressure.  Elevating, or raising the injured area. To follow this method, do these actions:  Rest the injured area.  If told, put ice on the injured area. ? Put ice in a plastic bag. ? Place a towel between your skin and the bag. ? Leave the ice on for 20 minutes, 2-3 times per day.  If told, put light pressure (compression) on the injured area using an elastic bandage. Make sure the bandage is not too tight. If the area tingles or becomes numb, remove it and put it back on as told by your doctor.  If possible, raise (elevate) the injured area above the level of your heart while you are sitting or lying down.   General instructions  Take over-the-counter and prescription medicines only as told by your doctor.  Keep all follow-up visits as told by your doctor. This is important. Contact a doctor if:  Your symptoms do not get better after several days of treatment.  Your symptoms get worse.  You have trouble moving the injured area. Get help right away if:  You have very bad pain.  You have a loss of feeling (numbness) in a hand or foot.  Your hand or foot turns pale or cold. Summary  A contusion is a deep bruise. This is a result of an injury that causes bleeding under the skin.  Symptoms of bruising include pain, swelling, and discolored skin. The skin may turn blue, purple, or yellow.  This condition is treated with rest, ice, compression, and elevation. This is also called RICE. You may be given over-the-counter medicines for pain.  Contact a doctor if you do not feel better, or you feel worse. Get  help right away if you have very bad pain, have lost feeling in a hand or foot, or the area turns pale or cold. This information is not intended to replace advice given to you by your health care provider. Make sure you discuss any questions you have with your health care provider. Document Revised: 07/18/2018 Document Reviewed: 07/18/2018 Elsevier Patient Education  2021 Elsevier Inc.  

## 2021-03-21 NOTE — Progress Notes (Signed)
   Subjective:    Patient ID: April [name], female    DOB: 10-24-1932, 87 y.o.   MRN: [id number]    Chief Complaint: fall  HPI Patient comes in for check up.from a fall. She picked up a flower on her porch and fell and hit her head. She went to ER at [facility] and they scanned her head and she was negative for a bleed. She also injured her eft shoulder when she feel. No fracture was seen. She just wants to be checked today to make sure she Is okay.   Review of Systems  Eyes: Negative for pain, discharge, redness and itching.  Musculoskeletal: Positive for arthralgias (left shoulder).  Skin:       Contusion on right forehead  Neurological: Negative for dizziness, weakness and headaches.  All other systems reviewed and are negative.      Objective:   Physical Exam Vitals and nursing note reviewed.  Constitutional:      Appearance: Normal appearance.  Cardiovascular:     Rate and Rhythm: Normal rate and regular rhythm.     Heart sounds: Murmur heard.    Pulmonary:     Breath sounds: Normal breath sounds.  Musculoskeletal:     Comments: Left shoulder tender to touch- FROM with pain on full abduction and internal rotation.  Skin:    General: Skin is warm.     Comments: Large hematoma right forehead.  Neurological:     General: No focal deficit present.     Mental Status: She is oriented to person, place, and time.     Cranial Nerves: No cranial nerve deficit.     Sensory: No sensory deficit.  Psychiatric:        Mood and Affect: Mood normal.        Behavior: Behavior normal.      BP (!) 142/83   Pulse 77   Temp 98.6 F (37 C) (Temporal)   Resp 20   Ht 5\' 3"  (1.6 m)   Wt 138 lb (62.6 kg)   SpO2 97%   BMI 24.45 kg/m         Assessment & Plan:  Posten in today with chief complaint of No chief complaint on file.   1. Hematoma of scalp, sequela Ice tid for at least 80m each Report and increasing headache of visual changes  2. Acute pain of left  shoulder Ice BID Rest Tylenol OTC as needed for pain RTO prn    The above assessment and management plan was discussed with the patient. The patient verbalized understanding of and has agreed to the management plan. Patient is aware to call the clinic if symptoms persist or worsen. Patient is aware when to return to the clinic for a follow-up visit. Patient educated on when it is appropriate to go to the emergency department.   April [name] 31m, FNP

## 2021-07-05 IMAGING — DX DG RIBS W/ CHEST 3+V*R*
5 series · 5 of 5 positions shown · non-contrast
Comparison: Chest radiograph July 21, 2020

CLINICAL DATA: Pain after fall.

EXAM:
RIGHT RIBS AND CHEST - 3+ VIEW

[chest pa]
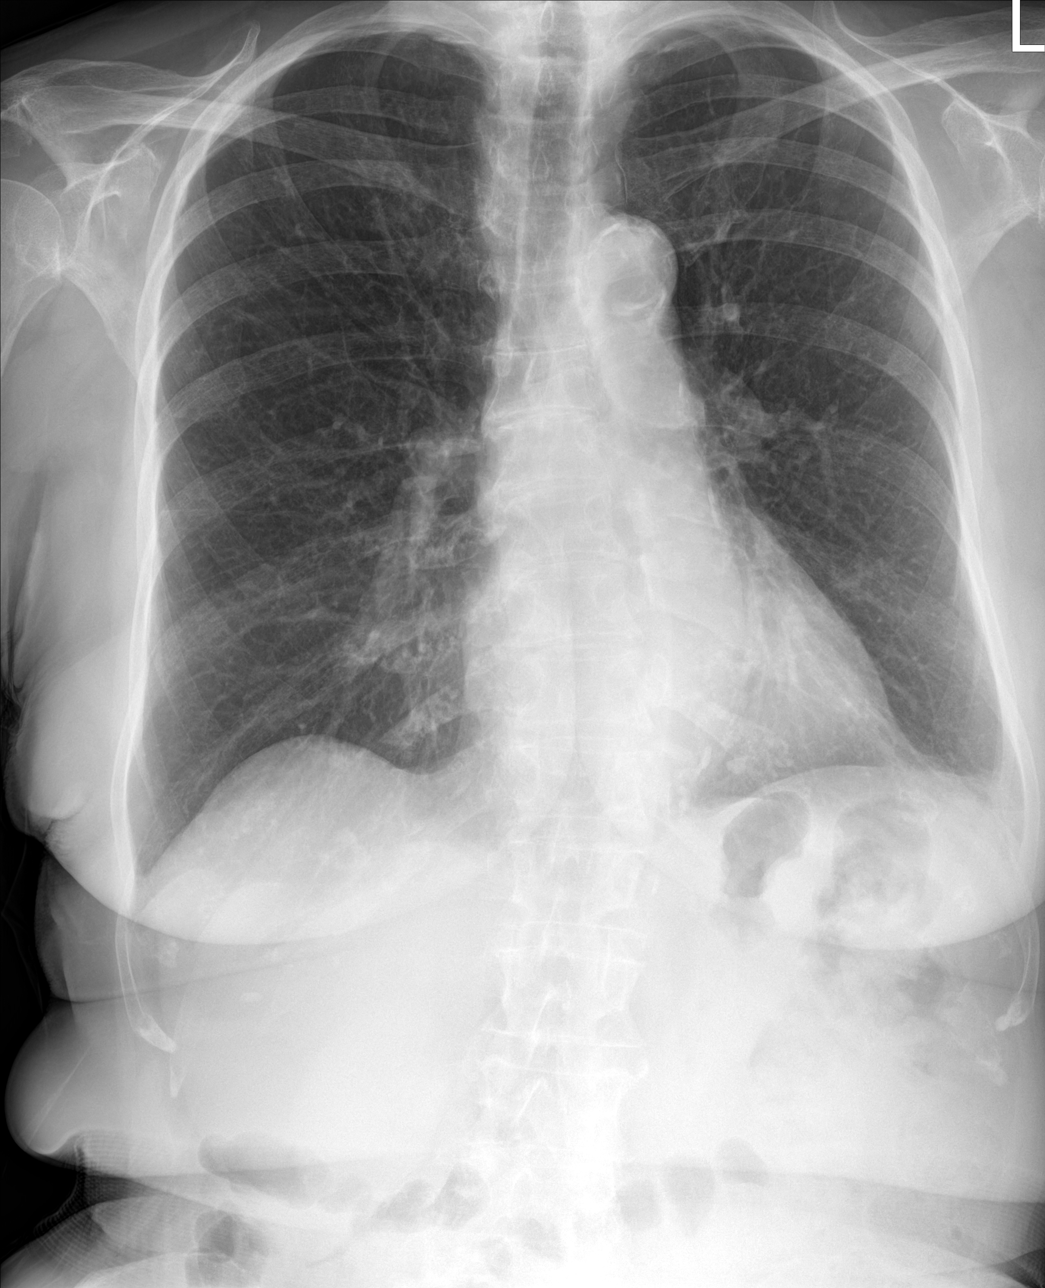

[rib obl (1 of 3)]
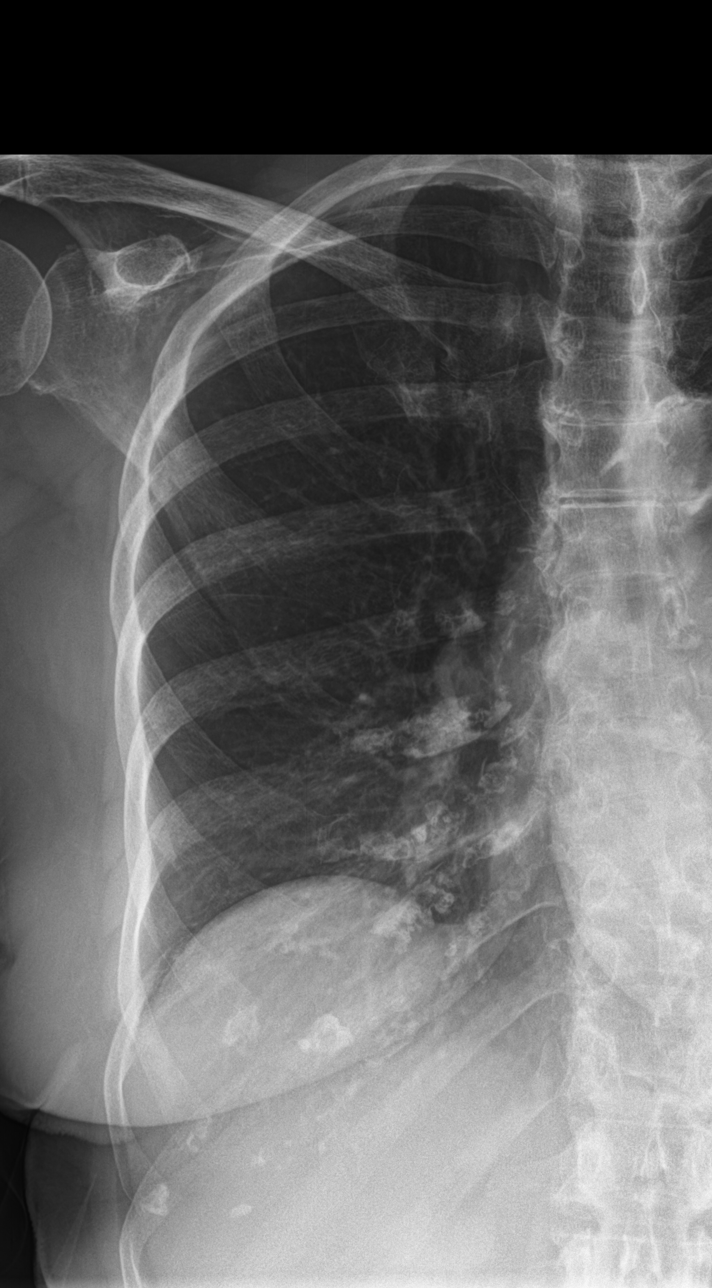

[rib obl (2 of 3)]
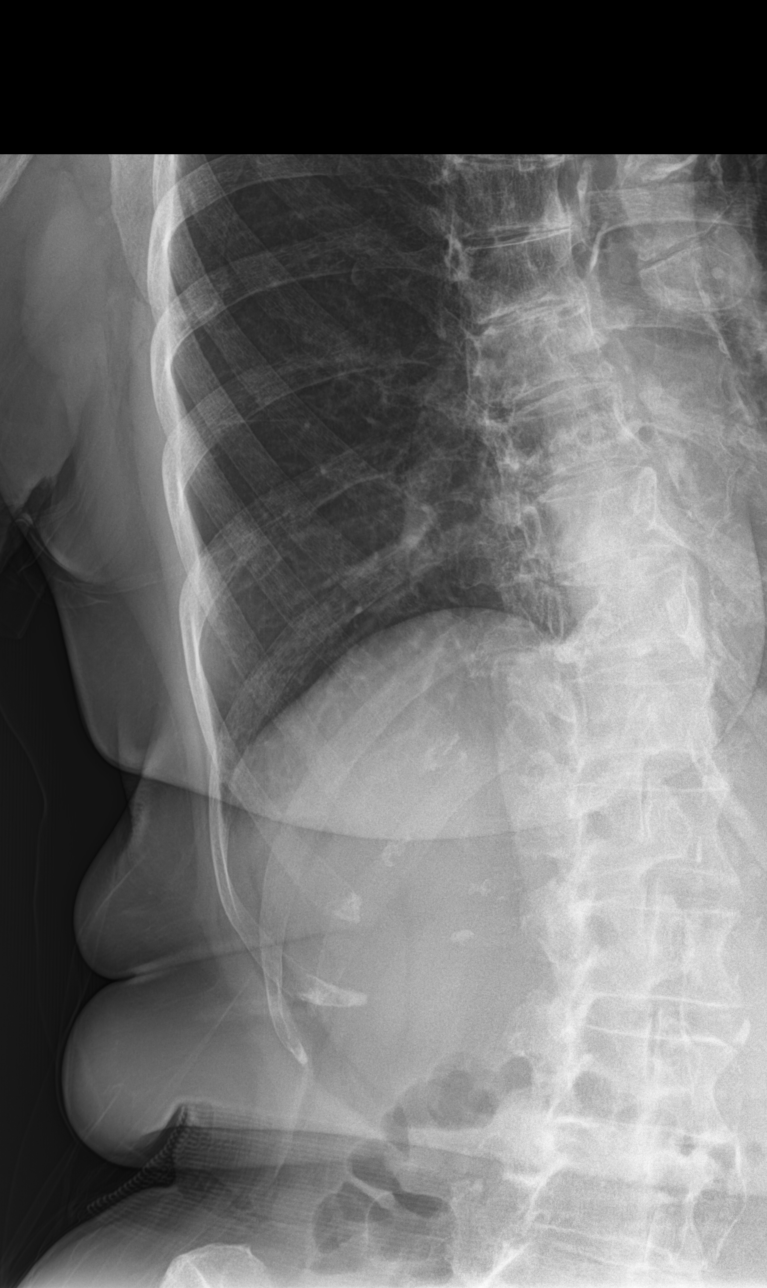

[rib pa]
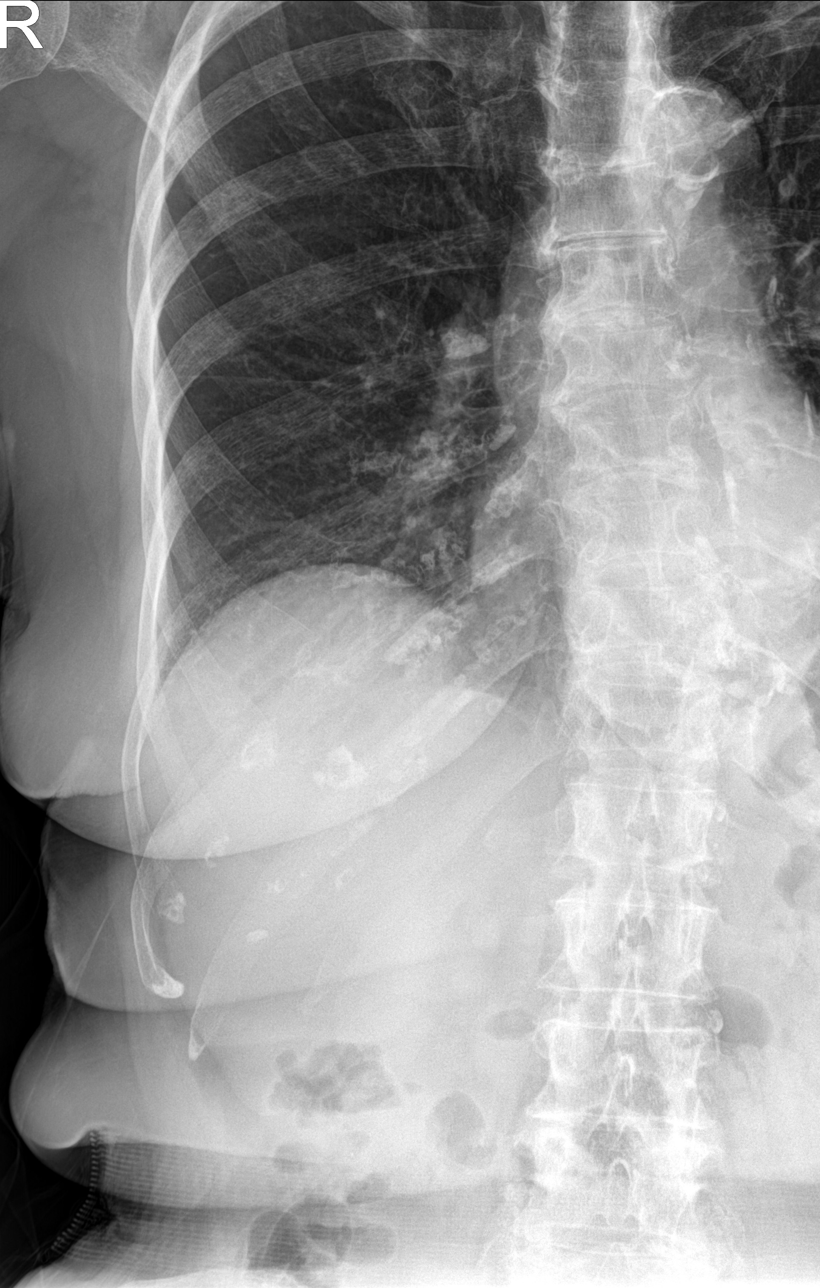

[rib obl (3 of 3)]
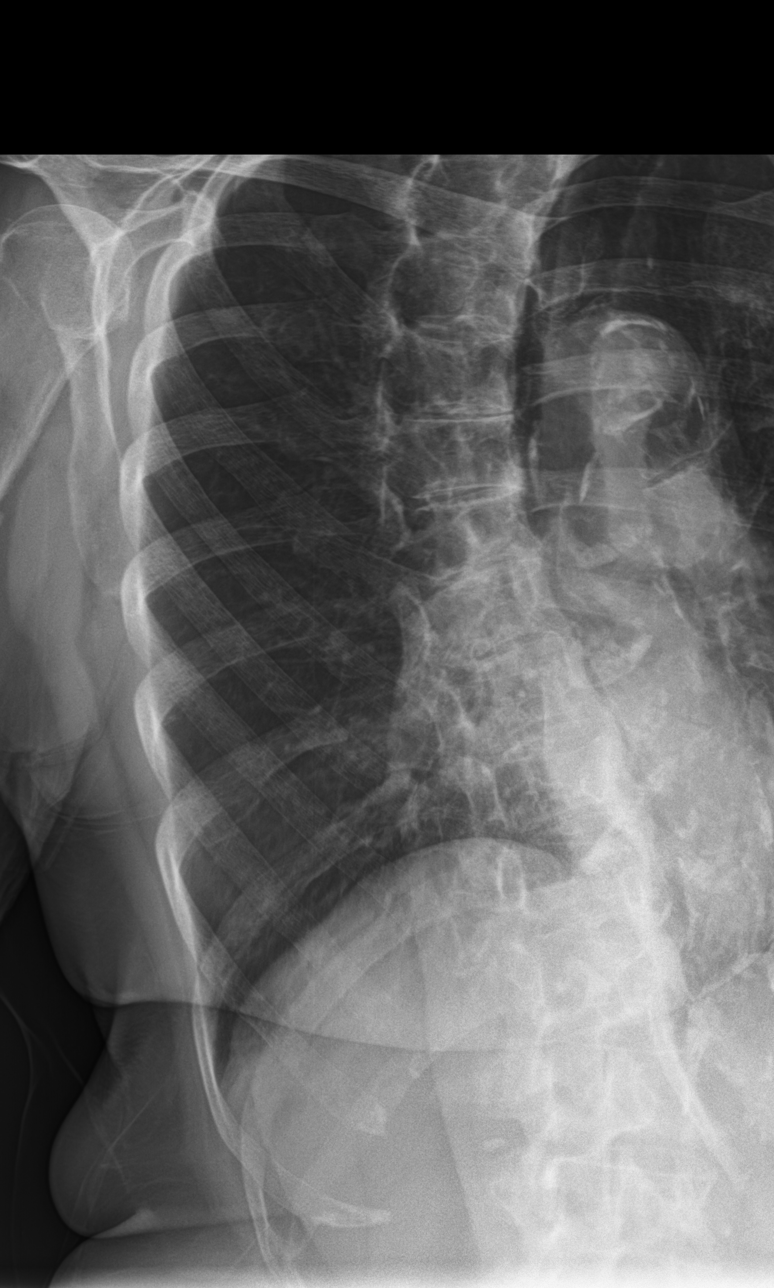

[5 of 5 positions shown; findings below may reference images not displayed]

FINDINGS: No fracture or other bone lesions are seen involving the ribs.
Spondylosis. There is no evidence of pneumothorax or pleural
effusion. Both lungs are clear. Heart size and mediastinal contours
are within normal limits. Tortuous atherosclerotic aorta.
IMPRESSION: 1. No displaced rib fracture visualized

## 2021-07-07 ENCOUNTER — Other Ambulatory Visit: Payer: Self-pay

## 2021-07-07 ENCOUNTER — Ambulatory Visit (INDEPENDENT_AMBULATORY_CARE_PROVIDER_SITE_OTHER): Payer: Medicare Other | Admitting: Nurse Practitioner

## 2021-07-07 ENCOUNTER — Encounter: Payer: Self-pay | Admitting: Nurse Practitioner

## 2021-07-07 VITALS — BP 142/86 | HR 77 | Temp 98.3°F | Resp 20 | Ht 67.0 in | Wt 140.0 lb

## 2021-07-07 DIAGNOSIS — G5793 Unspecified mononeuropathy of bilateral lower limbs: Secondary | ICD-10-CM

## 2021-07-07 DIAGNOSIS — E785 Hyperlipidemia, unspecified: Secondary | ICD-10-CM

## 2021-07-07 DIAGNOSIS — F411 Generalized anxiety disorder: Secondary | ICD-10-CM

## 2021-07-07 DIAGNOSIS — I10 Essential (primary) hypertension: Secondary | ICD-10-CM | POA: Diagnosis not present

## 2021-07-07 DIAGNOSIS — F5101 Primary insomnia: Secondary | ICD-10-CM

## 2021-07-07 DIAGNOSIS — K5790 Diverticulosis of intestine, part unspecified, without perforation or abscess without bleeding: Secondary | ICD-10-CM | POA: Diagnosis not present

## 2021-07-07 DIAGNOSIS — E039 Hypothyroidism, unspecified: Secondary | ICD-10-CM

## 2021-07-07 DIAGNOSIS — F0280 Dementia in other diseases classified elsewhere without behavioral disturbance: Secondary | ICD-10-CM

## 2021-07-07 DIAGNOSIS — G301 Alzheimer's disease with late onset: Secondary | ICD-10-CM

## 2021-07-07 MED ORDER — LEVOTHYROXINE SODIUM 75 MCG PO TABS: 75.0000 ug | ORAL_TABLET | Freq: Every day | ORAL | 1 refills | Status: DC

## 2021-07-07 MED ORDER — MEMANTINE HCL ER 21 MG PO CP24: ORAL_CAPSULE | ORAL | 1 refills | Status: DC

## 2021-07-07 MED ORDER — SERTRALINE HCL 50 MG PO TABS
50.0000 mg | ORAL_TABLET | Freq: Every day | ORAL | 1 refills | Status: DC
Start: 2021-07-07 — End: 2022-01-09

## 2021-07-07 MED ORDER — MOEXIPRIL HCL 15 MG PO TABS: 15.0000 mg | ORAL_TABLET | Freq: Every day | ORAL | 1 refills | Status: DC

## 2021-07-07 MED ORDER — DONEPEZIL HCL 10 MG PO TABS: 10.0000 mg | ORAL_TABLET | Freq: Every day | ORAL | 1 refills | Status: DC

## 2021-07-07 NOTE — Patient Instructions (Signed)

## 2021-07-07 NOTE — Progress Notes (Signed)
Subjective:    Patient ID: April [name], female    DOB: 1931-12-13, 87 y.o.   MRN: [id number]  Chief Complaint: medical management of chronic issues     HPI:  1. Primary hypertension No c/o chest pain, sob or headache. Does nt check blood pressure at home. BP Readings from Last 3 Encounters:  03/21/21 (!) 142/83  03/03/21 121/69  01/09/21 132/80     2. Hyperlipidemia with target LDL less than 100 Does not watch diet and does little to no dedicated exercise. Lab Results  Component Value Date   CHOL 273 (H) 01/09/2021   HDL 52 01/09/2021   LDLCALC 183 (H) 01/09/2021   TRIG 203 (H) 01/09/2021   CHOLHDL 5.3 (H) 01/09/2021     3. Acquired hypothyroidism No problems that she is aware of. Lab Results  Component Value Date   TSH 4.300 01/09/2021     4. Diverticulosis No recent flare ups  5. Late onset Alzheimer's disease without behavioral disturbance ([place]) Is currently on namenda and aricept. She thinks sheis maintaining at this time.  6. Neuropathy of both feet Has constant burning sensation in bil feet. She is currently not taking in medication for this.  7. GAD (generalized anxiety disorder) Is on zoloft daily which seems to help  8. Primary insomnia Sleeps about 6-7 hours a night. Some nights she has trouble falling asleep    Outpatient Encounter Medications as of 07/07/2021  Medication Sig   aspirin 81 MG tablet Take 81 mg by mouth daily.   Cholecalciferol (VITAMIN D3) 2000 UNITS TABS Take 1 tablet by mouth daily.   donepezil (ARICEPT) 10 MG tablet Take 1 tablet (10 mg total) by mouth at bedtime.   ibuprofen (ADVIL,MOTRIN) 600 MG tablet Take 1 tablet (600 mg total) by mouth 2 (two) times daily.   levothyroxine (SYNTHROID) 75 MCG tablet TAKE (1) TABLET DAILY BE- FORE BREAKFAST.   Melatonin 3 MG TABS Take by mouth.   memantine (NAMENDA XR) 21 MG CP24 24 hr capsule TAKE (1) CAPSULE DAILY   moexipril (UNIVASC) 15 MG tablet Take 1 tablet (15 mg total) by  mouth at bedtime.   sertraline (ZOLOFT) 50 MG tablet Take 1 tablet (50 mg total) by mouth daily.   No facility-administered encounter medications on file as of 07/07/2021.    Past Surgical History:  Procedure Laterality Date   FOOT SURGERY     Arthritis    HAND SURGERY     Arthritis    thumb surgery Left    [facility]    VAGINAL HYSTERECTOMY      Family History  Problem Relation Age of Onset   Pneumonia Father    Alcohol abuse Father    Dementia Sister     New complaints: None new  Social history: Her son lives with her.  Controlled substance contract: n/a     Review of Systems  Constitutional:  Negative for diaphoresis.  Eyes:  Negative for pain.  Respiratory:  Negative for shortness of breath.   Cardiovascular:  Negative for chest pain, palpitations and leg swelling.  Gastrointestinal:  Negative for abdominal pain.  Endocrine: Negative for polydipsia.  Skin:  Negative for rash.  Neurological:  Negative for dizziness, weakness and headaches.  Hematological:  Does not bruise/bleed easily.  All other systems reviewed and are negative.     Objective:   Physical Exam Vitals and nursing note reviewed.  Constitutional:      General: She is not in acute distress.  Appearance: Normal appearance. She is well-developed.  HENT:     Head: Normocephalic.     Right Ear: Tympanic membrane normal.     Left Ear: Tympanic membrane normal.     Nose: Nose normal.     Mouth/Throat:     Mouth: Mucous membranes are moist.  Eyes:     Pupils: Pupils are equal, round, and reactive to light.     Comments: Left eyebrow higher then right Right upper lid drooping- has been that way since she fell and hit her head several months ago.  Neck:     Vascular: No carotid bruit or JVD.  Cardiovascular:     Rate and Rhythm: Normal rate and regular rhythm.     Heart sounds: Normal heart sounds.  Pulmonary:     Effort: Pulmonary effort is normal. No respiratory distress.      Breath sounds: Normal breath sounds. No wheezing or rales.  Chest:     Chest wall: No tenderness.  Abdominal:     General: Bowel sounds are normal. There is no distension or abdominal bruit.     Palpations: Abdomen is soft. There is no hepatomegaly, splenomegaly, mass or pulsatile mass.     Tenderness: There is no abdominal tenderness.  Musculoskeletal:        General: Normal range of motion.     Cervical back: Normal range of motion and neck supple.  Lymphadenopathy:     Cervical: No cervical adenopathy.  Skin:    General: Skin is warm and dry.  Neurological:     Mental Status: She is alert and oriented to person, place, and time.     Deep Tendon Reflexes: Reflexes are normal and symmetric.  Psychiatric:        Behavior: Behavior normal.        Thought Content: Thought content normal.        Judgment: Judgment normal.    BP (!) 142/86   Pulse 77   Temp 98.3 F (36.8 C) (Temporal)   Resp 20   Ht $R'5\' 7"'Sb$  (1.702 m)   Wt 140 lb (63.5 kg)   SpO2 94%   BMI 21.93 kg/m         Assessment & Plan:  April [name] comes in today with chief complaint of Medical Management of Chronic Issues   Diagnosis and orders addressed:  1. Primary hypertension Low sodium diet - moexipril (UNIVASC) 15 MG tablet; Take 1 tablet (15 mg total) by mouth at bedtime.  Dispense: 90 tablet; Refill: 1 - CBC with Differential/Platelet - CMP14+EGFR  2. Hyperlipidemia with target LDL less than 100 Low fat diet - Lipid panel  3. Acquired hypothyroidism Labs pending - levothyroxine (SYNTHROID) 75 MCG tablet; Take 1 tablet (75 mcg total) by mouth daily before breakfast.  Dispense: 90 tablet; Refill: 1 - Thyroid Panel With TSH  4. Diverticulosis  5. 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  Dispense: 90 tablet; Refill: 1 - memantine (NAMENDA XR) 21 MG CP24 24 hr  capsule; TAKE (1) CAPSULE DAILY  Dispense: 90 capsule; Refill: 1  6. Neuropathy of both feet Do not go barefooted  7. GAD (generalized anxiety disorder) Stress management - sertraline (ZOLOFT) 50 MG tablet; Take 1 tablet (50 mg total) by mouth daily.  Dispense: 90 tablet; Refill: 1  8. Primary insomnia Bedtime routine   Labs pending Health Maintenance reviewed  Diet and exercise encouraged  Follow up plan: 6 months   [name] Hassell Done, FNP

## 2021-07-08 LAB — LIPID PANEL
Chol/HDL Ratio: 5.2 ratio — ABNORMAL HIGH (ref 0.0–4.4)
Cholesterol, Total: 279 mg/dL — ABNORMAL HIGH (ref 100–199)
HDL: 54 mg/dL (ref >39)
LDL Chol Calc (NIH): 184 mg/dL — ABNORMAL HIGH (ref 0–99)
Triglycerides: 219 mg/dL — ABNORMAL HIGH (ref 0–149)
VLDL Cholesterol Cal: 41 mg/dL — ABNORMAL HIGH (ref 5–40)

## 2021-07-08 LAB — CBC WITH DIFFERENTIAL/PLATELET
Basophils Absolute: 0.1 10*3/uL (ref 0.0–0.2)
Basos: 1 %
EOS (ABSOLUTE): 0.1 10*3/uL (ref 0.0–0.4)
Eos: 2 %
Hematocrit: 40.2 % (ref 34.0–46.6)
Hemoglobin: 13.8 g/dL (ref 11.1–15.9)
Immature Grans (Abs): 0 10*3/uL (ref 0.0–0.1)
Immature Granulocytes: 0 %
Lymphocytes Absolute: 1.9 10*3/uL (ref 0.7–3.1)
Lymphs: 27 %
MCH: 31.4 pg (ref 26.6–33.0)
MCHC: 34.3 g/dL (ref 31.5–35.7)
MCV: 91 fL (ref 79–97)
Monocytes Absolute: 0.8 10*3/uL (ref 0.1–0.9)
Monocytes: 11 %
Neutrophils Absolute: 4 10*3/uL (ref 1.4–7.0)
Neutrophils: 59 %
Platelets: 364 10*3/uL (ref 150–450)
RBC: 4.4 x10E6/uL (ref 3.77–5.28)
RDW: 12.7 % (ref 11.7–15.4)
WBC: 6.8 10*3/uL (ref 3.4–10.8)

## 2021-07-08 LAB — CMP14+EGFR
ALT: 14 IU/L (ref 0–32)
AST: 21 IU/L (ref 0–40)
Albumin/Globulin Ratio: 2.4 — ABNORMAL HIGH (ref 1.2–2.2)
Albumin: 4.7 g/dL — ABNORMAL HIGH (ref 3.6–4.6)
Alkaline Phosphatase: 78 IU/L (ref 44–121)
BUN/Creatinine Ratio: 20 (ref 12–28)
BUN: 19 mg/dL (ref 8–27)
Bilirubin Total: 0.4 mg/dL (ref 0.0–1.2)
CO2: 23 mmol/L (ref 20–29)
Calcium: 9.3 mg/dL (ref 8.7–10.3)
Chloride: 101 mmol/L (ref 96–106)
Creatinine, Ser: 0.97 mg/dL (ref 0.57–1.00)
Globulin, Total: 2 g/dL (ref 1.5–4.5)
Glucose: 90 mg/dL (ref 65–99)
Potassium: 4.1 mmol/L (ref 3.5–5.2)
Sodium: 140 mmol/L (ref 134–144)
Total Protein: 6.7 g/dL (ref 6.0–8.5)
eGFR: 57 mL/min/{1.73_m2} — ABNORMAL LOW (ref >59)

## 2021-07-08 LAB — THYROID PANEL WITH TSH
Free Thyroxine Index: 1.7 (ref 1.2–4.9)
T3 Uptake Ratio: 26 % (ref 24–39)
T4, Total: 6.4 ug/dL (ref 4.5–12.0)
TSH: 3.4 u[IU]/mL (ref 0.450–4.500)

## 2021-08-01 ENCOUNTER — Telehealth: Payer: Self-pay | Admitting: Nurse Practitioner

## 2021-08-18 ENCOUNTER — Telehealth: Payer: Self-pay | Admitting: Nurse Practitioner

## 2021-08-18 NOTE — Telephone Encounter (Signed)
LVM for pt to rtn my call to schedule AWV with [facility]. Please schedule this appt if pt calls the office.  Thanks  

## 2021-10-12 ENCOUNTER — Telehealth: Payer: Self-pay | Admitting: Nurse Practitioner

## 2021-10-12 NOTE — Telephone Encounter (Signed)
No answer unable to leave a  message for patient to call back and schedule Medicare Annual Wellness Visit (AWV) to be completed by video or phone.   Last AWV: 04/07/2020  Please schedule at anytime with [facility] Health Advisor.  45 minute appointment  Any questions, please contact me at [phone]

## 2021-10-31 ENCOUNTER — Ambulatory Visit (INDEPENDENT_AMBULATORY_CARE_PROVIDER_SITE_OTHER): Payer: Medicare Other

## 2021-10-31 VITALS — Wt 140.0 lb

## 2021-10-31 DIAGNOSIS — Z0000 Encounter for general adult medical examination without abnormal findings: Secondary | ICD-10-CM | POA: Diagnosis not present

## 2021-10-31 NOTE — Patient Instructions (Signed)
[name] , Thank you for taking time to come for your Medicare Wellness Visit. I appreciate your ongoing commitment to your health goals. Please review the following plan we discussed and let me know if I can assist you in the future.   Screening recommendations/referrals: Colonoscopy: No longer required Mammogram: No longer required Bone Density: No longer required Recommended yearly ophthalmology/optometry visit for glaucoma screening and checkup Recommended yearly dental visit for hygiene and checkup  Vaccinations: Influenza vaccine: Done 08/29/2021 - Repeat annually  Pneumococcal vaccine: Done 09/10/2019 & 10/07/2014 Tdap vaccine: Done 10/21/2019 - Repeat in 10 years Shingles vaccine: Zostavax done 2011 - due for Shingrix   Covid-19: Janssen done 03/21/2020, Moderna booster done 12/20/2020  Advanced directives: Please bring a copy of your health care power of attorney and living will to the office to be added to your chart at your convenience.   Conditions/risks identified: Aim for 30 minutes of exercise or brisk walking each day, drink 6-8 glasses of water and eat lots of fruits and vegetables.   Next appointment: Follow up in one year for your annual wellness visit    Preventive Care 65 Years and Older, Female Preventive care refers to lifestyle choices and visits with your health care provider that can promote health and wellness. What does preventive care include? A yearly physical exam. This is also called an annual well check. Dental exams once or twice a year. Routine eye exams. Ask your health care provider how often you should have your eyes checked. Personal lifestyle choices, including: Daily care of your teeth and gums. Regular physical activity. Eating a healthy diet. Avoiding tobacco and drug use. Limiting alcohol use. Practicing safe sex. Taking low-dose aspirin every day. Taking vitamin and mineral supplements as recommended by your health care provider. What  happens during an annual well check? The services and screenings done by your health care provider during your annual well check will depend on your age, overall health, lifestyle risk factors, and family history of disease. Counseling  Your health care provider may ask you questions about your: Alcohol use. Tobacco use. Drug use. Emotional well-being. Home and relationship well-being. Sexual activity. Eating habits. History of falls. Memory and ability to understand (cognition). Work and work Astronomer. Reproductive health. Screening  You may have the following tests or measurements: Height, weight, and BMI. Blood pressure. Lipid and cholesterol levels. These may be checked every 5 years, or more frequently if you are over 82 years old. Skin check. Lung cancer screening. You may have this screening every year starting at age 44 if you have a 30-pack-year history of smoking and currently smoke or have quit within the past 15 years. Fecal occult blood test (FOBT) of the stool. You may have this test every year starting at age 49. Flexible sigmoidoscopy or colonoscopy. You may have a sigmoidoscopy every 5 years or a colonoscopy every 10 years starting at age 56. Hepatitis C blood test. Hepatitis B blood test. Sexually transmitted disease (STD) testing. Diabetes screening. This is done by checking your blood sugar (glucose) after you have not eaten for a while (fasting). You may have this done every 1-3 years. Bone density scan. This is done to screen for osteoporosis. You may have this done starting at age 39. Mammogram. This may be done every 1-2 years. Talk to your health care provider about how often you should have regular mammograms. Talk with your health care provider about your test results, treatment options, and if necessary, the need for  more tests. Vaccines  Your health care provider April [name] recommend certain vaccines, such as: Influenza vaccine. This is recommended every  year. Tetanus, diphtheria, and acellular pertussis (Tdap, Td) vaccine. You April [name] need a Td booster every 10 years. Zoster vaccine. You April [name] need this after age 5. Pneumococcal 13-valent conjugate (PCV13) vaccine. One dose is recommended after age 41. Pneumococcal polysaccharide (PPSV23) vaccine. One dose is recommended after age 71. Talk to your health care provider about which screenings and vaccines you need and how often you need them. This information is not intended to replace advice given to you by your health care provider. Make sure you discuss any questions you have with your health care provider. Document Released: 12/23/2015 Document Revised: 08/15/2016 Document Reviewed: 09/27/2015 Elsevier Interactive Patient Education  2017 April [name] Prevention in the Home Falls can cause injuries. They can happen to people of all ages. There are many things you can do to make your home safe and to help prevent falls. What can I do on the outside of my home? Regularly fix the edges of walkways and driveways and fix any cracks. Remove anything that might make you trip as you walk through a door, such as a raised step or threshold. Trim any bushes or trees on the path to your home. Use bright outdoor lighting. Clear any walking paths of anything that might make someone trip, such as rocks or tools. Regularly check to see if handrails are loose or broken. Make sure that both sides of any steps have handrails. Any raised decks and porches should have guardrails on the edges. Have any leaves, snow, or ice cleared regularly. Use sand or salt on walking paths during winter. Clean up any spills in your garage right away. This includes oil or grease spills. What can I do in the bathroom? Use night lights. Install grab bars by the toilet and in the tub and shower. Do not use towel bars as grab bars. Use non-skid mats or decals in the tub or shower. If you need to sit down in the shower, use a  plastic, non-slip stool. Keep the floor dry. Clean up any water that spills on the floor as soon as it happens. Remove soap buildup in the tub or shower regularly. Attach bath mats securely with double-sided non-slip rug tape. Do not have throw rugs and other things on the floor that can make you trip. What can I do in the bedroom? Use night lights. Make sure that you have a light by your bed that is easy to reach. Do not use any sheets or blankets that are too big for your bed. They should not hang down onto the floor. Have a firm chair that has side arms. You can use this for support while you get dressed. Do not have throw rugs and other things on the floor that can make you trip. What can I do in the kitchen? Clean up any spills right away. Avoid walking on wet floors. Keep items that you use a lot in easy-to-reach places. If you need to reach something above you, use a strong step stool that has a grab bar. Keep electrical cords out of the way. Do not use floor polish or wax that makes floors slippery. If you must use wax, use non-skid floor wax. Do not have throw rugs and other things on the floor that can make you trip. What can I do with my stairs? Do not leave any items on the stairs. Make  sure that there are handrails on both sides of the stairs and use them. Fix handrails that are broken or loose. Make sure that handrails are as long as the stairways. Check any carpeting to make sure that it is firmly attached to the stairs. Fix any carpet that is loose or worn. Avoid having throw rugs at the top or bottom of the stairs. If you do have throw rugs, attach them to the floor with carpet tape. Make sure that you have a light switch at the top of the stairs and the bottom of the stairs. If you do not have them, ask someone to add them for you. What else can I do to help prevent falls? Wear shoes that: Do not have high heels. Have rubber bottoms. Are comfortable and fit you  well. Are closed at the toe. Do not wear sandals. If you use a stepladder: Make sure that it is fully opened. Do not climb a closed stepladder. Make sure that both sides of the stepladder are locked into place. Ask someone to hold it for you, if possible. Clearly mark and make sure that you can see: Any grab bars or handrails. First and last steps. Where the edge of each step is. Use tools that help you move around (mobility aids) if they are needed. These include: Canes. Walkers. Scooters. Crutches. Turn on the lights when you go into a dark area. Replace any light bulbs as soon as they burn out. Set up your furniture so you have a clear path. Avoid moving your furniture around. If any of your floors are uneven, fix them. If there are any pets around you, be aware of where they are. Review your medicines with your doctor. Some medicines can make you feel dizzy. This can increase your chance of falling. Ask your doctor what other things that you can do to help prevent falls. This information is not intended to replace advice given to you by your health care provider. Make sure you discuss any questions you have with your health care provider. Document Released: 09/22/2009 Document Revised: 05/03/2016 Document Reviewed: 12/31/2014 Elsevier Interactive Patient Education  2017 [employer].

## 2021-10-31 NOTE — Progress Notes (Signed)
Subjective:   April [name] is a 87 y.o. female who presents for Medicare Annual (Subsequent) preventive examination.  Virtual Visit via Telephone Note  I connected with  April [name] on 10/31/21 at  2:45 PM EST by telephone and verified that I am speaking with the correct person using two identifiers.  Location: Patient: Home Provider: [facility] Persons participating in the virtual visit: patient/Nurse Health Advisor   I discussed the limitations, risks, security and privacy concerns of performing an evaluation and management service by telephone and the availability of in person appointments. The patient expressed understanding and agreed to proceed.  Interactive audio and video telecommunications were attempted between this nurse and patient, however failed, due to patient having technical difficulties OR patient did not have access to video capability.  We continued and completed visit with audio only.  Some vital signs may be absent or patient reported.   Zubin Pontillo E Loyce Klasen, LPN   Review of Systems     Cardiac Risk Factors include: advanced age (>83men, >27 women);sedentary lifestyle;hypertension;dyslipidemia     Objective:    Today's Vitals   10/31/21 1426  Weight: 140 lb (63.5 kg)   Body mass index is 21.93 kg/m.  Advanced Directives 10/31/2021 04/07/2020  Does Patient Have a Medical Advance Directive? Yes Yes  Type of Advance Directive Healthcare Power of [employer] Power of Attorney  Copy of Healthcare Power of Attorney in Chart? No - copy requested -    Current Medications (verified) Outpatient Encounter Medications as of 10/31/2021  Medication Sig   aspirin 81 MG tablet Take 81 mg by mouth daily.   Cholecalciferol (VITAMIN D3) 2000 UNITS TABS Take 1 tablet by mouth daily.   donepezil (ARICEPT) 10 MG tablet Take 1 tablet (10 mg total) by mouth at bedtime.   ibuprofen (ADVIL,MOTRIN) 600 MG tablet Take 1 tablet (600 mg total) by mouth 2 (two) times daily.    levothyroxine (SYNTHROID) 75 MCG tablet Take 1 tablet (75 mcg total) by mouth daily before breakfast.   Melatonin 3 MG TABS Take by mouth.   memantine (NAMENDA XR) 21 MG CP24 24 hr capsule TAKE (1) CAPSULE DAILY   moexipril (UNIVASC) 15 MG tablet Take 1 tablet (15 mg total) by mouth at bedtime.   sertraline (ZOLOFT) 50 MG tablet Take 1 tablet (50 mg total) by mouth daily.   No facility-administered encounter medications on file as of 10/31/2021.    Allergies (verified) Acid reducer [cimetidine] and Mevacor [lovastatin]   History: Past Medical History:  Diagnosis Date   Arthritis    Cognitive changes    Hyperlipidemia    Hypertension    Thyroid disease    Past Surgical History:  Procedure Laterality Date   FOOT SURGERY     Arthritis    HAND SURGERY     Arthritis    thumb surgery Left    [facility]    VAGINAL HYSTERECTOMY     Family History  Problem Relation Age of Onset   Pneumonia Father    Alcohol abuse Father    Dementia Sister    Social History   Socioeconomic History   Marital status: Widowed    Spouse name: Not on file   Number of children: 2   Years of education: 42   Highest education level: 12th grade  Occupational History   Occupation: retired  Tobacco Use   Smoking status: Former   Smokeless tobacco: Never  Substance and Sexual Activity   Alcohol use: No  Alcohol/week: 1.0 standard drink    Types: 1 Glasses of wine per week    Comment: socially   Drug use: No   Sexual activity: Not on file  Other Topics Concern   Not on file  Social History Narrative   Lives at home - son Octavia Bruckner lives with her   Other son, Ronalee Belts is HCPOA   Right-handed   Caffeine use: Drinks green tea/ soda and coffee once in a while    Social Determinants of Radio broadcast assistant Strain: Low Risk    Difficulty of Paying Living Expenses: Not very hard  Food Insecurity: No Food Insecurity   Worried About Charity fundraiser in the Last Year: Never true   Youth worker in the Last Year: Never true  Transportation Needs: No Transportation Needs   Lack of Transportation (Medical): No   Lack of Transportation (Non-Medical): No  Physical Activity: Inactive   Days of Exercise per Week: 0 days   Minutes of Exercise per Session: 0 min  Stress: No Stress Concern Present   Feeling of Stress : Not at all  Social Connections: Moderately Isolated   Frequency of Communication with Friends and Family: More than three times a week   Frequency of Social Gatherings with Friends and Family: Twice a week   Attends Religious Services: 1 to 4 times per year   Active Member of [employer] or Organizations: No   Attends Archivist Meetings: Never   Marital Status: Widowed    Tobacco Counseling Counseling given: Not Answered   Clinical Intake:  Pre-visit preparation completed: Yes  Pain : No/denies pain     BMI - recorded: 21.93 Nutritional Status: BMI of 19-24  Normal Nutritional Risks: None Diabetes: No  How often do you need to have someone help you when you read instructions, pamphlets, or other written materials from your doctor or pharmacy?: 1 - Never  Diabetic? no  Interpreter Needed?: No  Information entered by :: Tacie Mccuistion, LPN   Activities of Daily Living In your present state of health, do you have any difficulty performing the following activities: 10/31/2021  Hearing? N  Vision? N  Difficulty concentrating or making decisions? Y  Walking or climbing stairs? Y  Dressing or bathing? N  Doing errands, shopping? N  Preparing Food and eating ? N  Using the Toilet? N  In the past six months, have you accidently leaked urine? N  Do you have problems with loss of bowel control? N  Managing your Medications? N  Managing your Finances? N  Housekeeping or managing your Housekeeping? N  Some recent data might be hidden    Patient Care Team: Chevis Pretty, FNP as PCP - General (Family Medicine)  Indicate any recent  Medical Services you may have received from other than [facility] providers in the past year (date may be approximate).     Assessment:   This is a routine wellness examination for April [name].  Hearing/Vision screen Hearing Screening - Comments:: Denies hearing difficulties  Vision Screening - Comments:: Denies vision difficulties - hasn't seen eye doctor in years  Dietary issues and exercise activities discussed: Current Exercise Habits: The patient does not participate in regular exercise at present, Exercise limited by: orthopedic condition(s)   Goals Addressed             This Visit's Progress    Prevent falls       Consider using a cane Try strength and balance exercises Get  up and move for 5 minutes every hour Change positions slowly       Depression Screen PHQ 2/9 Scores 10/31/2021 07/07/2021 01/09/2021 08/02/2020 07/21/2020 07/07/2020 05/31/2020  PHQ - 2 Score 0 0 0 1 0 0 0  PHQ- 9 Score - 0 - - - - -    Fall Risk Fall Risk  10/31/2021 07/07/2021 03/21/2021 03/03/2021 01/09/2021  Falls in the past year? 1 1 1 1 1   Number falls in past yr: 0 0 0 1 0  Injury with Fall? 1 0 1 1 0  Risk for fall due to : History of fall(s);Mental status change History of fall(s) History of fall(s) - History of fall(s)  Follow up Education provided;Falls prevention discussed Education provided Education provided Falls prevention discussed Education provided    FALL RISK PREVENTION PERTAINING TO THE HOME:  Any stairs in or around the home? No  If so, are there any without handrails? No  Home free of loose throw rugs in walkways, pet beds, electrical cords, etc? Yes  Adequate lighting in your home to reduce risk of falls? Yes   ASSISTIVE DEVICES UTILIZED TO PREVENT FALLS:  Life alert? Yes  Use of a cane, walker or w/c? No  Grab bars in the bathroom? Yes  Shower chair or bench in shower? Yes  Elevated toilet seat or a handicapped toilet? Yes   TIMED UP AND GO:  Was the test performed? No .  Telephonic visit  Cognitive Function: Cognitive status assessed by direct observation. Patient has current diagnosis of cognitive impairment. Patient is followed by neurology for ongoing assessment. Dr Jaynee Eagles. Patient is unable to complete screening 6CIT or MMSE.   MMSE - Mini Mental State Exam 08/02/2020 01/12/2020 08/14/2017  Orientation to time 4 3 4   Orientation to Place 5 5 4   Registration 3 3 2   Attention/ Calculation 4 2 3   Recall 0 1 0  Language- name 2 objects 2 2 2   Language- repeat 1 1 1   Language- follow 3 step command 3 3 3   Language- read & follow direction 1 1 1   Write a sentence 1 0 1  Copy design 1 0 0  Total score 25 21 21    Montreal Cognitive Assessment  08/14/2017 10/15/2016 04/11/2016  Visuospatial/ Executive (0/5) 4 2 5   Naming (0/3) 2 3 2   Attention: Read list of digits (0/2) 1 2 1   Attention: Read list of letters (0/1) 1 1 0  Attention: Serial 7 subtraction starting at 100 (0/3) 3 1 0  Language: Repeat phrase (0/2) 2 2 2   Language : Fluency (0/1) 0 1 0  Abstraction (0/2) 2 2 1   Delayed Recall (0/5) 4 0 0  Orientation (0/6) 5 6 5   Total 24 20 16    6CIT Screen 04/07/2020  What Year? 0 points  What month? 3 points  What time? 0 points  Count back from 20 4 points  Months in reverse 0 points  Repeat phrase 6 points  Total Score 13    Immunizations Immunization History  Administered Date(s) Administered   Fluad Quad(high Dose 65+) 08/29/2021   Influenza Split 09/14/2013   Influenza, High Dose Seasonal PF 09/16/2017, 09/10/2018   Influenza,inj,quad, With Preservative 09/02/2019   Influenza-Unspecified 09/14/2013, 09/03/2016, 09/16/2017, 09/10/2018, 09/10/2019, 12/20/2020   Janssen (J&J) SARS-COV-2 Vaccination 03/21/2020   Moderna Sars-Covid-2 Vaccination 12/20/2020   Pneumococcal Conjugate-13 10/11/1999, 10/07/2014   Pneumococcal Polysaccharide-23 09/10/2019   Td 09/28/2009   Tdap 10/21/2019    TDAP status: Up to date  Flu Vaccine status: Up to  date  Pneumococcal vaccine status: Up to date  Covid-19 vaccine status: Completed vaccines  Qualifies for Shingles Vaccine? Yes   Zostavax completed No   Shingrix Completed?: No.    Education has been provided regarding the importance of this vaccine. Patient has been advised to call insurance company to determine out of pocket expense if they have not yet received this vaccine. Advised may also receive vaccine at local pharmacy or Health Dept. Verbalized acceptance and understanding.  Screening Tests Health Maintenance  Topic Date Due   Zoster Vaccines- Shingrix (1 of 2) Never done   COVID-19 Vaccine (3 - Booster for Janssen series) 02/14/2021   TETANUS/TDAP  10/20/2029   Pneumonia Vaccine 94+ Years old  Completed   INFLUENZA VACCINE  Completed   DEXA SCAN  Completed   HPV VACCINES  Aged Out    Health Maintenance  Health Maintenance Due  Topic Date Due   Zoster Vaccines- Shingrix (1 of 2) Never done   COVID-19 Vaccine (3 - Booster for Janssen series) 02/14/2021    Colorectal cancer screening: No longer required.   Mammogram status: No longer required due to age.  Bone Density scan: Declined  Lung Cancer Screening: (Low Dose CT Chest recommended if Age 3-80 years, 30 pack-year currently smoking OR have quit w/in 15years.) does not qualify.  Additional Screening:  Hepatitis C Screening: does not qualify  Vision Screening: Recommended annual ophthalmology exams for early detection of glaucoma and other disorders of the eye. Is the patient up to date with their annual eye exam?  No  Who is the provider or what is the name of the office in which the patient attends annual eye exams? none If pt is not established with a provider, would they like to be referred to a provider to establish care? No .   Dental Screening: Recommended annual dental exams for proper oral hygiene  Community Resource Referral / Chronic Care Management: CRR required this visit?  No   CCM  required this visit?  No      Plan:     I have personally reviewed and noted the following in the patient's chart:   Medical and social history Use of alcohol, tobacco or illicit drugs  Current medications and supplements including opioid prescriptions.  Functional ability and status Nutritional status Physical activity Advanced directives List of other physicians Hospitalizations, surgeries, and ER visits in previous 12 months Vitals Screenings to include cognitive, depression, and falls Referrals and appointments  In addition, I have reviewed and discussed with patient certain preventive protocols, quality metrics, and best practice recommendations. A written personalized care plan for preventive services as well as general preventive health recommendations were provided to patient.     Sandrea Hammond, LPN   [id number]   Nurse Notes: None

## 2021-12-29 ENCOUNTER — Ambulatory Visit (INDEPENDENT_AMBULATORY_CARE_PROVIDER_SITE_OTHER): Payer: Medicare Other | Admitting: Nurse Practitioner

## 2021-12-29 ENCOUNTER — Encounter: Payer: Self-pay | Admitting: Nurse Practitioner

## 2021-12-29 VITALS — BP 152/76 | HR 67 | Temp 97.5°F | Resp 20 | Ht 67.0 in | Wt 135.0 lb

## 2021-12-29 DIAGNOSIS — R404 Transient alteration of awareness: Secondary | ICD-10-CM | POA: Diagnosis not present

## 2021-12-29 LAB — MICROSCOPIC EXAMINATION
RBC, Urine: NONE SEEN /hpf (ref 0–2)
Renal Epithel, UA: NONE SEEN /hpf

## 2021-12-29 LAB — URINALYSIS, COMPLETE
Bilirubin, UA: NEGATIVE
Glucose, UA: NEGATIVE
Leukocytes,UA: NEGATIVE
Nitrite, UA: NEGATIVE
RBC, UA: NEGATIVE
Specific Gravity, UA: >1.03 — ABNORMAL HIGH (ref 1.005–1.030)
Urobilinogen, Ur: 1 mg/dL (ref 0.2–1.0)
pH, UA: 5.5 (ref 5.0–7.5)

## 2021-12-29 NOTE — Progress Notes (Signed)
° °  Subjective:    Patient ID: April [name], female    DOB: Aug 18, 1932, 88 y.o.   MRN: [id number]   Chief Complaint: Altered Mental Status   HPI Patient is brought in today by her son. She has been having episodes  of altered mental status. Seems to come and go. Some days she is good and other days she is confused and talking out of her head. She is on namenda and aricept. Her son lives with her.   BP Readings from Last 3 Encounters:  12/29/21 (!) 181/93  07/07/21 (!) 142/86  03/21/21 (!) 142/83      Review of Systems  Constitutional:  Negative for diaphoresis.  Eyes:  Negative for pain.  Respiratory:  Negative for shortness of breath.   Cardiovascular:  Negative for chest pain, palpitations and leg swelling.  Gastrointestinal:  Negative for abdominal pain.  Endocrine: Negative for polydipsia.  Genitourinary:  Negative for dysuria, frequency and urgency.  Skin:  Negative for rash.  Neurological:  Negative for dizziness, weakness and headaches.  Hematological:  Does not bruise/bleed easily.  All other systems reviewed and are negative.     Objective:   Physical Exam Vitals reviewed.  Constitutional:      Appearance: Normal appearance.  Cardiovascular:     Rate and Rhythm: Normal rate and regular rhythm.     Heart sounds: Murmur (2/6) heard.  Pulmonary:     Breath sounds: Normal breath sounds.  Skin:    General: Skin is warm.  Neurological:     General: No focal deficit present.     Mental Status: She is alert and oriented to person, place, and time.  Psychiatric:        Mood and Affect: Mood normal.        Behavior: Behavior normal.    BP (!) 181/93    Pulse 67    Temp (!) 97.5 F (36.4 C) (Temporal)    Resp 20    Ht 5\' 7"  (1.702 m)    Wt 135 lb (61.2 kg)    SpO2 97%    BMI 21.14 kg/m        Assessment & Plan:  Bagnall in today with chief complaint of Altered Mental Status   1. Transient alteration of awareness Seems good today Orient daily RTO  prn - Urinalysis, Complete    The above assessment and management plan was discussed with the patient. The patient verbalized understanding of and has agreed to the management plan. Patient is aware to call the clinic if symptoms persist or worsen. Patient is aware when to return to the clinic for a follow-up visit. Patient educated on when it is appropriate to go to the emergency department.   [name] Laurey Morale, FNP

## 2021-12-29 NOTE — Patient Instructions (Signed)
Confusion Confusion is the inability to think with your usual speed or clarity. Confusion can be caused by many things. People who are confused often describe their thinking as cloudy or unclear. Confusion can also include feeling disoriented. This means you are unaware of where you are or who you are. You may also not know the date or time. When confused, you may have trouble remembering, paying attention, or making decisions. Some people also act aggressively when they areconfused. In some cases, confusion may come on quickly. In other cases, it may developslowly over time. Confusion may be caused by medical conditions such as: Infections, such as a urinary tract infection (UTI). Low levels of oxygen, which can develop from conditions such as long-term lung disorders. Decrease in brain function due to dementia and other conditions that affect the brain, such as seizures, strokes, brain tumors, or head injuries. Mental health conditions, like panic attacks, anxiety, depression, and hallucinations. Confusion may also be caused by physical factors such as: Loss of fluid (dehydration) or an imbalance of salts and minerals in the body (electrolytes). Lack of certain nutrients like niacin, thiamine, or other B vitamins. Fever or hypothermia, which is a sudden drop in body temperature. Low or high blood sugar. Low or high blood pressure. Other causes include: Lack of sleep or changes in routine or surroundings, such as when traveling or staying in a hospital. Using too much alcohol, drugs, or medicine. Side effects of medicines, or taking medicines that affect other medicines (drug interactions). Follow these instructions at home: Pay attention to your symptoms. Tell your health care provider about any changes or if you develop new symptoms. Follow these instructions to control ortreat symptoms. Ask a family member or friend for help if needed. Medicines  Take over-the-counter and prescription  medicines only as told by your health care provider. Ask your health care provider about changing or stopping any medicines that may be causing your confusion. Avoid pain medicines or sleep medicines until you have fully recovered. Use a pillbox or an alarm to help you take the right medicines at the right time.  Lifestyle  Eat a balanced diet that includes fruits and vegetables. Get enough sleep. For most adults, this is 7-9 hours each night. Do not drink alcohol. Do not become isolated. Spend time with other people and make plans for your days. Do not drive until your health care provider says that it is safe to do so. Do not use any products that contain nicotine or tobacco, such as cigarettes, e-cigarettes, and chewing tobacco. If you need help quitting, ask your health care provider. Stop other activities that may increase your chances of getting hurt. These may include some work duties, sports activities, swimming, or bike riding. Ask your health care provider what activities are safe for you.  Tips for caregivers Find out if the person is confused. Ask the person to state his or her name, age, and the date. If the person is unsure or answers incorrectly, he or she may be confused and need assistance. Always introduce yourself, no matter how well the person knows you. Remind the person of his or her location. Place a calendar and clock near the person who is confused. Keep a regular schedule. Make sure the person has plenty of light during the day and sleep at night. Talk about current events and plans for the day. Keep the environment calm, quiet, and peaceful. Help the person do the things that he or she is unable to do.   These include: Taking medicines. Keeping medical appointments. Helping with household duties, including meal preparation. Running errands. Get help if you need it. There are several support groups for caregivers. If the person you are helping needs more support,  consider day care, extended-care programs, or a skilled nursing facility. The person's health care provider may be able to help evaluate these options. General instructions Monitor yourself for any conditions you may have. These can include: Checking your blood glucose levels if you have diabetes. Maintaining a healthy weight. Monitoring your blood pressure if you have hypertension. Monitoring your body temperature if you have a fever. Keep all follow-up visits. This is important. Contact a health care provider if: You have new symptoms or your symptoms get worse. Get help right away if you: Feel that you are not able to care for yourself. Develop severe headaches, repeated vomiting, seizures, blackouts, or slurred speech. Have increasing confusion, weakness, numbness, restlessness, or personality changes. Develop a loss of balance, have marked dizziness, feel uncoordinated, or fall. Develop severe anxiety, or you have delusions or hallucinations. These symptoms may represent a serious problem that is an emergency. Do not wait to see if the symptoms will go away. Get medical help right away. Call your local emergency services (911 in the [place] Do not drive yourself to the hospital. Summary Confusion is the inability to think with your usual speed or clarity. People who are confused often describe their thinking as cloudy or unclear. Confusion can also include having trouble remembering, paying attention, or making decisions. Confusion may come on quickly or develop slowly over time, depending on the cause. There are many different causes of confusion. Ask for help from family members or friends if you are unable to take care of yourself. This information is not intended to replace advice given to you by your health care provider. Make sure you discuss any questions you have with your healthcare provider. Document Revised: 03/22/2020 Document Reviewed: 03/22/2020 Elsevier Patient Education   2022 Elsevier Inc.  

## 2022-01-09 ENCOUNTER — Ambulatory Visit (INDEPENDENT_AMBULATORY_CARE_PROVIDER_SITE_OTHER): Payer: Medicare Other | Admitting: Nurse Practitioner

## 2022-01-09 ENCOUNTER — Encounter: Payer: Self-pay | Admitting: Nurse Practitioner

## 2022-01-09 VITALS — BP 162/88 | HR 74 | Temp 98.0°F | Resp 20 | Ht 67.0 in | Wt 134.0 lb

## 2022-01-09 DIAGNOSIS — F5101 Primary insomnia: Secondary | ICD-10-CM

## 2022-01-09 DIAGNOSIS — F0280 Dementia in other diseases classified elsewhere without behavioral disturbance: Secondary | ICD-10-CM

## 2022-01-09 DIAGNOSIS — I10 Essential (primary) hypertension: Secondary | ICD-10-CM

## 2022-01-09 DIAGNOSIS — G301 Alzheimer's disease with late onset: Secondary | ICD-10-CM | POA: Diagnosis not present

## 2022-01-09 DIAGNOSIS — K5790 Diverticulosis of intestine, part unspecified, without perforation or abscess without bleeding: Secondary | ICD-10-CM

## 2022-01-09 DIAGNOSIS — E039 Hypothyroidism, unspecified: Secondary | ICD-10-CM

## 2022-01-09 DIAGNOSIS — G5793 Unspecified mononeuropathy of bilateral lower limbs: Secondary | ICD-10-CM | POA: Diagnosis not present

## 2022-01-09 DIAGNOSIS — F411 Generalized anxiety disorder: Secondary | ICD-10-CM

## 2022-01-09 DIAGNOSIS — E785 Hyperlipidemia, unspecified: Secondary | ICD-10-CM

## 2022-01-09 LAB — LIPID PANEL

## 2022-01-09 MED ORDER — MEMANTINE HCL ER 21 MG PO CP24: ORAL_CAPSULE | ORAL | 1 refills | Status: AC

## 2022-01-09 MED ORDER — DONEPEZIL HCL 10 MG PO TABS: 10.0000 mg | ORAL_TABLET | Freq: Every day | ORAL | 1 refills | Status: DC

## 2022-01-09 MED ORDER — LISINOPRIL 40 MG PO TABS: 40.0000 mg | ORAL_TABLET | Freq: Every day | ORAL | 3 refills | Status: DC

## 2022-01-09 MED ORDER — SERTRALINE HCL 50 MG PO TABS: 50.0000 mg | ORAL_TABLET | Freq: Every day | ORAL | 1 refills | Status: AC

## 2022-01-09 MED ORDER — LEVOTHYROXINE SODIUM 75 MCG PO TABS: 75.0000 ug | ORAL_TABLET | Freq: Every day | ORAL | 1 refills | Status: AC

## 2022-01-09 NOTE — Progress Notes (Signed)
Subjective:    Patient ID: April [name], female    DOB: [birth year], 88 y.o.   MRN: [id number]   Chief Complaint: Medical Management of Chronic Issues    HPI:  April [name] is a 88 y.o. who identifies as a female who was assigned female at birth.   Social history: Lives with: son Work history: retired   Scientist, forensic in today for follow up of the following chronic medical issues:  1. Primary hypertension No a/o chest pain, sob or headache. Doe snot check blood pressure at home. BP Readings from Last 3 Encounters:  01/09/22 (!) 184/86  12/29/21 (!) 152/76  07/07/21 (!) 142/86     2. Hyperlipidemia with target LDL less than 100 Eats whatever her family feeds her. Refuses statin Lab Results  Component Value Date   CHOL 279 (H) 07/07/2021   HDL 54 07/07/2021   LDLCALC 184 (H) 07/07/2021   TRIG 219 (H) 07/07/2021   CHOLHDL 5.2 (H) 07/07/2021     3. Diverticulosis Has had no recent flare ups.  4. Acquired hypothyroidism No problems that she is aware of. Lab Results  Component Value Date   TSH 3.400 07/07/2021     5. GAD (generalized anxiety disorder) Ison zoloft which helps keep her calm. Depression screen [facility] 2/9 01/09/2022 12/29/2021 10/31/2021 07/07/2021 01/09/2021  Decreased Interest 0 0 0 0 0  Down, Depressed, Hopeless 0 1 0 0 0  PHQ - 2 Score 0 1 0 0 0  Altered sleeping 0 0 - 0 -  Tired, decreased energy 0 0 - 0 -  Change in appetite 0 0 - 0 -  Feeling bad or failure about yourself  0 0 - 0 -  Trouble concentrating 0 0 - 0 -  Moving slowly or fidgety/restless 0 0 - 0 -  Suicidal thoughts 0 0 - 0 -  PHQ-9 Score 0 1 - 0 -  Difficult doing work/chores Not difficult at all Not difficult at all - Not difficult at all -   GAD 7 : Generalized Anxiety Score 01/09/2022 12/29/2021 07/07/2021 01/09/2021  Nervous, Anxious, on Edge 0 0 0 1  Control/stop worrying 0 0 0 1  Worry too much - different things 0 0 0 0  Trouble relaxing 0 0 0 0  Restless 0 0 0 0  Easily  annoyed or irritable 0 0 0 0  Afraid - awful might happen 0 0 0 0  Total GAD 7 Score 0 0 0 2  Anxiety Difficulty Not difficult at all Not difficult at all Not difficult at all -      6. Primary insomnia Is on melatonin to sleeps and sleeps well most nights  7. Late onset Alzheimer's disease without behavioral disturbance ([place]) Is on namenda and aricept. Her son says she has good days and bad days.  8. Neuropathy of both feet Has burning sensation bil feet. Is on no prescription meds    New complaints: None today  Allergies  Allergen Reactions   Acid Reducer [Cimetidine] Nausea Only   Mevacor [Lovastatin] Nausea Only   Outpatient Encounter Medications as of 01/09/2022  Medication Sig   aspirin 81 MG tablet Take 81 mg by mouth daily.   Cholecalciferol (VITAMIN D3) 2000 UNITS TABS Take 1 tablet by mouth daily.   donepezil (ARICEPT) 10 MG tablet Take 1 tablet (10 mg total) by mouth at bedtime.   ibuprofen (ADVIL,MOTRIN) 600 MG tablet Take 1 tablet (600 mg total) by mouth 2 (two) times daily.  levothyroxine (SYNTHROID) 75 MCG tablet Take 1 tablet (75 mcg total) by mouth daily before breakfast.   Melatonin 3 MG TABS Take by mouth.   memantine (NAMENDA XR) 21 MG CP24 24 hr capsule TAKE (1) CAPSULE DAILY   moexipril (UNIVASC) 15 MG tablet Take 1 tablet (15 mg total) by mouth at bedtime.   sertraline (ZOLOFT) 50 MG tablet Take 1 tablet (50 mg total) by mouth daily.   No facility-administered encounter medications on file as of 01/09/2022.    Past Surgical History:  Procedure Laterality Date   FOOT SURGERY     Arthritis    HAND SURGERY     Arthritis    thumb surgery Left    [facility]    VAGINAL HYSTERECTOMY      Family History  Problem Relation Age of Onset   Pneumonia Father    Alcohol abuse Father    Dementia Sister       Controlled substance contract: n/a     Review of Systems  Constitutional:  Negative for diaphoresis.  Eyes:  Negative for pain.   Respiratory:  Negative for shortness of breath.   Cardiovascular:  Negative for chest pain, palpitations and leg swelling.  Gastrointestinal:  Negative for abdominal pain.  Endocrine: Negative for polydipsia.  Skin:  Negative for rash.  Neurological:  Negative for dizziness, weakness and headaches.  Hematological:  Does not bruise/bleed easily.  All other systems reviewed and are negative.     Objective:   Physical Exam Vitals and nursing note reviewed.  Constitutional:      General: She is not in acute distress.    Appearance: Normal appearance. She is well-developed.  HENT:     Head: Normocephalic.     Right Ear: Tympanic membrane normal.     Left Ear: Tympanic membrane normal.     Nose: Nose normal.     Mouth/Throat:     Mouth: Mucous membranes are moist.  Eyes:     Pupils: Pupils are equal, round, and reactive to light.  Neck:     Vascular: No carotid bruit or JVD.  Cardiovascular:     Rate and Rhythm: Normal rate and regular rhythm.     Heart sounds: Normal heart sounds.  Pulmonary:     Effort: Pulmonary effort is normal. No respiratory distress.     Breath sounds: Normal breath sounds. No wheezing or rales.  Chest:     Chest wall: No tenderness.  Abdominal:     General: Bowel sounds are normal. There is no distension or abdominal bruit.     Palpations: Abdomen is soft. There is no hepatomegaly, splenomegaly, mass or pulsatile mass.     Tenderness: There is no abdominal tenderness.  Musculoskeletal:        General: Normal range of motion.     Cervical back: Normal range of motion and neck supple.  Lymphadenopathy:     Cervical: No cervical adenopathy.  Skin:    General: Skin is warm and dry.  Neurological:     Mental Status: She is alert and oriented to person, place, and time.     Deep Tendon Reflexes: Reflexes are normal and symmetric.  Psychiatric:        Behavior: Behavior normal.        Thought Content: Thought content normal.        Judgment: Judgment  normal.   BP (!) 162/88    Pulse 74    Temp 98 F (36.7 C) (Temporal)  Resp 20    Ht _0  (1.702 m)    Wt 134 lb (60.8 kg)    SpO2 97%    BMI 20.99 kg/m          Assessment & Plan:   April [name] comes in today with chief complaint of Medical Management of Chronic Issues   Diagnosis and orders addressed:  1. Primary hypertension Low sodium diet - lisinopril (ZESTRIL) 40 MG tablet; Take 1 tablet (40 mg total) by mouth daily.  Dispense: 90 tablet; Refill: 3 - CBC with Differential/Platelet - CMP14+EGFR  2. Hyperlipidemia with target LDL less than 100 Low fat diet - Lipid panel  3. Diverticulosis Told son to watch her diet to prevent flare up  4. Acquired hypothyroidism labspending - levothyroxine (SYNTHROID) 75 MCG tablet; Take 1 tablet (75 mcg total) by mouth daily before breakfast.  Dispense: 90 tablet; Refill: 1 - Thyroid Panel With TSH  5. GAD (generalized anxiety disorder) Stress manaegment - sertraline (ZOLOFT) 50 MG tablet; Take 1 tablet (50 mg total) by mouth daily.  Dispense: 90 tablet; Refill: 1  6. Primary insomnia Bedtime routine  7. Late onset Alzheimer's disease without behavioral disturbance ([facility]) [place] daily - donepezil (ARICEPT) 10 MG tablet; Take 1 tablet (10 mg total) by mouth at bedtime.  Dispense: 90 tablet; Refill: 1 - memantine (NAMENDA XR) 21 MG CP24 24 hr capsule; TAKE (1) CAPSULE DAILY  Dispense: 90 capsule; Refill: 1  8. Neuropathy of both feet Report worsening of symptoms   Labs pending Health Maintenance reviewed Diet and exercise encouraged  Follow up plan: 6 months   [place], FNP

## 2022-01-09 NOTE — Patient Instructions (Signed)
Dementia Caregiver Guide °Dementia is a term used to describe a number of symptoms that affect memory and thinking. The most common symptoms include: °Memory loss. °Trouble with language and communication. °Trouble concentrating. °Poor judgment and problems with reasoning. °Wandering from home or public places. °Extreme anxiety or depression. °Being suspicious or having angry outbursts and accusations. °Child-like behavior and language. °Dementia can be frightening and confusing. And taking care of someone with dementia can be challenging. This guide provides tips to help you when providing care for a person with dementia. °How to help manage lifestyle changes °Dementia usually gets worse slowly over time. In the early stages, people with dementia can stay independent and safe with some help. In later stages, they need help with daily tasks such as dressing, grooming, and using the bathroom. There are actions you can take to help a person manage his or her life while living with this condition. °Communicating °When the person is talking or seems frustrated, make eye contact and hold the person's hand. °Ask specific questions that need yes or no answers. °Use simple words, short sentences, and a calm voice. Only give one direction at a time. °When offering choices, limit the person to just one or two. °Avoid correcting the person in a negative way. °If the person is struggling to find the right words, gently try to help him or her. °Preventing injury ° °Keep floors clear of clutter. Remove rugs, magazine racks, and floor lamps. °Keep hallways well lit, especially at night. °Put a handrail and nonslip mat in the bathtub or shower. °Put childproof locks on cabinets that contain dangerous items, such as medicines, alcohol, guns, toxic cleaning items, sharp tools or utensils, matches, and lighters. °For doors to the outside of the house, put the locks in places where the person cannot see or reach them easily. This will  help ensure that the person does not wander out of the house and get lost. °Be prepared for emergencies. Keep a list of emergency phone numbers and addresses in a convenient area. °Remove car keys and lock garage doors so that the person does not try to get in the car and drive. °Have the person wear a bracelet that tracks locations and identifies the person as having memory problems. This should be worn at all times for safety. °Helping with daily life ° °Keep the person on track with his or her routine. °Try to identify areas where the person may need help. °Be supportive, patient, calm, and encouraging. °Gently remind the person that adjusting to changes takes time. °Help with the tasks that the person has asked for help with. °Keep the person involved in daily tasks and decisions as much as possible. °Encourage conversation, but try not to get frustrated if the person struggles to find words or does not seem to appreciate your help. °How to recognize stress °Look for signs of stress in yourself and in the person you are caring for. If you notice signs of stress, take steps to manage it. Symptoms of stress include: °Feeling anxious, irritable, frustrated, or angry. °Denying that the person has dementia or that his or her symptoms will not improve. °Feeling depressed, hopeless, or unappreciated. °Difficulty sleeping. °Difficulty concentrating. °Developing stress-related health problems. °Feeling like you have too little time for your own life. °Follow these instructions at home: °Take care of your health °Make sure that you and the person you are caring for: °Get regular sleep. °Exercise regularly. °Eat regular, nutritious meals. °Take over-the-counter and prescription medicines only   as told by your health care providers. °Drink enough fluid to keep your urine pale yellow. °Attend all scheduled health care appointments. ° °General instructions °Join a support group with others who are caregivers. °Ask about  respite care resources. Respite care can provide short-term care for the person so that you can have a regular break from the stress of caregiving. °Consider any safety risks and take steps to avoid them. °Organize medicines in a pill box for each day of the week. °Create a plan to handle any legal or financial matters. Get legal or financial advice if needed. °Keep a calendar in a central location to remind the person of appointments or other activities. °Where to find support: °Many individuals and organizations offer support. These include: °Support groups for people with dementia. °Support groups for caregivers. °Counselors or therapists. °Home health care services. °Adult day care centers. °Where to find more information °Centers for Disease Control and Prevention: www.cdc.gov °Alzheimer's Association: www.alz.org °Family Caregiver Alliance: www.caregiver.org °Alzheimer's Foundation of [place]: www.alzfdn.org °Contact a health care provider if: °The person's health is rapidly getting worse. °You are no longer able to care for the person. °Caring for the person is affecting your physical and emotional health. °You are feeling depressed or anxious about caring for the person. °Get help right away if: °The person threatens himself or herself, you, or anyone else. °You feel depressed or sad, or feel that you want to harm yourself. °If you ever feel like your loved one may hurt himself or herself or others, or if he or she shares thoughts about taking his or her own life, get help right away. You can go to your nearest emergency department or: °Call your local emergency services (911 in the [place] °Call a suicide crisis helpline, such as the National Suicide Prevention Lifeline at 1-800-273-8255 or 988 in the [place] This is open 24 hours a day in the [place] °Text the Crisis Text Line at 741741 (in the [place] °Summary °Dementia is a term used to describe a number of symptoms that affect memory and thinking. °Dementia  usually gets worse slowly over time. °Take steps to reduce the person's risk of injury and to plan for future care. °Caregivers need support, relief from caregiving, and time for their own lives. °This information is not intended to replace advice given to you by your health care provider. Make sure you discuss any questions you have with your health care provider. °Document Revised: 06/21/2021 Document Reviewed: 04/11/2020 °Elsevier Patient Education © 2022 Elsevier Inc. ° °

## 2022-01-10 LAB — CBC WITH DIFFERENTIAL/PLATELET
Basophils Absolute: 0.1 10*3/uL (ref 0.0–0.2)
Basos: 1 %
EOS (ABSOLUTE): 0.2 10*3/uL (ref 0.0–0.4)
Eos: 4 %
Hematocrit: 42 % (ref 34.0–46.6)
Hemoglobin: 13.9 g/dL (ref 11.1–15.9)
Immature Grans (Abs): 0 10*3/uL (ref 0.0–0.1)
Immature Granulocytes: 0 %
Lymphocytes Absolute: 2 10*3/uL (ref 0.7–3.1)
Lymphs: 31 %
MCH: 30.2 pg (ref 26.6–33.0)
MCHC: 33.1 g/dL (ref 31.5–35.7)
MCV: 91 fL (ref 79–97)
Monocytes Absolute: 0.9 10*3/uL (ref 0.1–0.9)
Monocytes: 14 %
Neutrophils Absolute: 3.1 10*3/uL (ref 1.4–7.0)
Neutrophils: 50 %
Platelets: 355 10*3/uL (ref 150–450)
RBC: 4.6 x10E6/uL (ref 3.77–5.28)
RDW: 13 % (ref 11.7–15.4)
WBC: 6.2 10*3/uL (ref 3.4–10.8)

## 2022-01-10 LAB — CMP14+EGFR
ALT: 12 IU/L (ref 0–32)
AST: 21 IU/L (ref 0–40)
Albumin/Globulin Ratio: 2.1 (ref 1.2–2.2)
Albumin: 4.4 g/dL (ref 3.6–4.6)
Alkaline Phosphatase: 86 IU/L (ref 44–121)
BUN/Creatinine Ratio: 17 (ref 12–28)
BUN: 14 mg/dL (ref 8–27)
Bilirubin Total: 0.3 mg/dL (ref 0.0–1.2)
CO2: 28 mmol/L (ref 20–29)
Calcium: 9.5 mg/dL (ref 8.7–10.3)
Chloride: 103 mmol/L (ref 96–106)
Creatinine, Ser: 0.82 mg/dL (ref 0.57–1.00)
Globulin, Total: 2.1 g/dL (ref 1.5–4.5)
Glucose: 87 mg/dL (ref 70–99)
Potassium: 3.5 mmol/L (ref 3.5–5.2)
Sodium: 145 mmol/L — ABNORMAL HIGH (ref 134–144)
Total Protein: 6.5 g/dL (ref 6.0–8.5)
eGFR: 69 mL/min/{1.73_m2} (ref >59)

## 2022-01-10 LAB — LIPID PANEL
Chol/HDL Ratio: 5 ratio — ABNORMAL HIGH (ref 0.0–4.4)
Cholesterol, Total: 273 mg/dL — ABNORMAL HIGH (ref 100–199)
HDL: 55 mg/dL (ref >39)
LDL Chol Calc (NIH): 172 mg/dL — ABNORMAL HIGH (ref 0–99)
Triglycerides: 244 mg/dL — ABNORMAL HIGH (ref 0–149)
VLDL Cholesterol Cal: 46 mg/dL — ABNORMAL HIGH (ref 5–40)

## 2022-01-10 LAB — THYROID PANEL WITH TSH
Free Thyroxine Index: 1.6 (ref 1.2–4.9)
T3 Uptake Ratio: 26 % (ref 24–39)
T4, Total: 6.3 ug/dL (ref 4.5–12.0)
TSH: 3.16 u[IU]/mL (ref 0.450–4.500)

## 2022-02-26 NOTE — Telephone Encounter (Signed)
Please make time in my schedule to peak with patient sone prior to her visit ?

## 2022-02-28 ENCOUNTER — Encounter: Payer: Self-pay | Admitting: Nurse Practitioner

## 2022-02-28 ENCOUNTER — Ambulatory Visit (INDEPENDENT_AMBULATORY_CARE_PROVIDER_SITE_OTHER): Payer: Medicare Other | Admitting: Nurse Practitioner

## 2022-02-28 ENCOUNTER — Telehealth: Payer: Self-pay | Admitting: Nurse Practitioner

## 2022-02-28 VITALS — BP 193/84 | HR 57 | Temp 97.6°F | Resp 20 | Ht 67.0 in | Wt 127.0 lb

## 2022-02-28 DIAGNOSIS — F03918 Unspecified dementia, unspecified severity, with other behavioral disturbance: Secondary | ICD-10-CM | POA: Diagnosis not present

## 2022-02-28 MED ORDER — QUETIAPINE FUMARATE 25 MG PO TABS: 25.0000 mg | ORAL_TABLET | Freq: Two times a day (BID) | ORAL | 2 refills | Status: DC

## 2022-02-28 NOTE — Progress Notes (Signed)
? ?  Subjective:  ? ? Patient ID: April [name], female    DOB: Jun 10, 1932, 88 y.o.   MRN: [id number] ? ? ?Chief Complaint: Dementia and aggresive behavior ? ? ?HPI ?Patient is brought in today by her son to have FL2 papers filled out for [facility]. Patient lived with one of her sons and it has become impossible for him to care for her. She has become very aggressive and confused, even though she seems good when she is here. They are going to move her to [facility] [place] but patient is not aware of that yet. Family says her aggression has worsened and she is having anger out bursts. She will hit and throw things at times. They are wanting medication for her to take to calm her down.She had follow up of chronic issues at the end os January and her labs were good. ? ? ? ?Review of Systems  ?Constitutional:  Negative for diaphoresis.  ?Eyes:  Negative for pain.  ?Respiratory:  Negative for shortness of breath.   ?Cardiovascular:  Negative for chest pain, palpitations and leg swelling.  ?Gastrointestinal:  Negative for abdominal pain.  ?Endocrine: Negative for polydipsia.  ?Skin:  Negative for rash.  ?Neurological:  Negative for dizziness, weakness and headaches.  ?Hematological:  Does not bruise/bleed easily.  ?All other systems reviewed and are negative. ? ?   ?Objective:  ? Physical Exam ?Constitutional:   ?   Appearance: Normal appearance.  ?Cardiovascular:  ?   Rate and Rhythm: Normal rate and regular rhythm.  ?   Heart sounds: Normal heart sounds.  ?Pulmonary:  ?   Effort: Pulmonary effort is normal.  ?   Breath sounds: Normal breath sounds.  ?Neurological:  ?   General: No focal deficit present.  ?   Mental Status: She is alert and oriented to person, place, and time.  ?Psychiatric:     ?   Mood and Affect: Mood normal.     ?   Behavior: Behavior normal.  ?   Comments: Patient very calm during visit ?cooperative  ? ? ?BP (!) 193/84   Pulse (!) 57   Temp 97.6 ?F (36.4 ?C) (Temporal)   Resp 20   Ht 5\' 7"  (1.702 m)    Wt 127 lb (57.6 kg)   SpO2 98%   BMI 19.89 kg/m?  ? ? ? ?   ?Assessment & Plan:  ? ? Scarber in today with chief complaint of Dementia and aggresive behavior ? ? ?1. Aggressive behavior due to dementia ?Added seroquel 25mg  BID to see if hlps ?Patient still unaware of moving to [place]. ?FL2 forms filled out for family ? ? ?The above assessment and management plan was discussed with the patient. The patient verbalized understanding of and has agreed to the management plan. Patient is aware to call the clinic if symptoms persist or worsen. Patient is aware when to return to the clinic for a follow-up visit. Patient educated on when it is appropriate to go to the emergency department.  ? ?[name] , FNP ? ? ?

## 2022-02-28 NOTE — Telephone Encounter (Signed)
So contacted ?

## 2022-02-28 NOTE — Telephone Encounter (Signed)
Encounter closed, patient was seen earlier today. ?

## 2022-02-28 NOTE — Telephone Encounter (Signed)
Pts son called stating that uncle is planning on bringing pt in for her appt today but says they need to know if MMM can call pt in a valium or xanax or something to calm her down before her appt because she has dementia and is very combative.  ? ?Says when the FL2 forms are filled out today, do not give them to pt. Call son Casimiro Needle) and he will come to pick up the paperwork. He is pts POA. ?

## 2022-03-01 ENCOUNTER — Telehealth: Payer: Self-pay | Admitting: Nurse Practitioner

## 2022-03-01 NOTE — Telephone Encounter (Signed)
Please review and advise.

## 2022-03-08 DIAGNOSIS — G4700 Insomnia, unspecified: Secondary | ICD-10-CM | POA: Diagnosis not present

## 2022-03-08 DIAGNOSIS — I129 Hypertensive chronic kidney disease with stage 1 through stage 4 chronic kidney disease, or unspecified chronic kidney disease: Secondary | ICD-10-CM | POA: Diagnosis not present

## 2022-03-08 DIAGNOSIS — G309 Alzheimer's disease, unspecified: Secondary | ICD-10-CM | POA: Diagnosis not present

## 2022-03-08 DIAGNOSIS — E782 Mixed hyperlipidemia: Secondary | ICD-10-CM | POA: Diagnosis not present

## 2022-03-08 DIAGNOSIS — E039 Hypothyroidism, unspecified: Secondary | ICD-10-CM | POA: Diagnosis not present

## 2022-03-08 DIAGNOSIS — K5790 Diverticulosis of intestine, part unspecified, without perforation or abscess without bleeding: Secondary | ICD-10-CM | POA: Diagnosis not present

## 2022-03-08 DIAGNOSIS — G629 Polyneuropathy, unspecified: Secondary | ICD-10-CM | POA: Diagnosis not present

## 2022-03-15 DIAGNOSIS — E039 Hypothyroidism, unspecified: Secondary | ICD-10-CM | POA: Diagnosis not present

## 2022-03-15 DIAGNOSIS — I129 Hypertensive chronic kidney disease with stage 1 through stage 4 chronic kidney disease, or unspecified chronic kidney disease: Secondary | ICD-10-CM | POA: Diagnosis not present

## 2022-03-15 DIAGNOSIS — E782 Mixed hyperlipidemia: Secondary | ICD-10-CM | POA: Diagnosis not present

## 2022-03-15 DIAGNOSIS — G309 Alzheimer's disease, unspecified: Secondary | ICD-10-CM | POA: Diagnosis not present

## 2022-03-26 DIAGNOSIS — L600 Ingrowing nail: Secondary | ICD-10-CM | POA: Diagnosis not present

## 2022-03-26 DIAGNOSIS — M79671 Pain in right foot: Secondary | ICD-10-CM | POA: Diagnosis not present

## 2022-03-26 DIAGNOSIS — M79672 Pain in left foot: Secondary | ICD-10-CM | POA: Diagnosis not present

## 2022-03-26 DIAGNOSIS — M2042 Other hammer toe(s) (acquired), left foot: Secondary | ICD-10-CM | POA: Diagnosis not present

## 2022-03-26 DIAGNOSIS — B351 Tinea unguium: Secondary | ICD-10-CM | POA: Diagnosis not present

## 2022-03-26 DIAGNOSIS — M2041 Other hammer toe(s) (acquired), right foot: Secondary | ICD-10-CM | POA: Diagnosis not present

## 2022-04-05 DIAGNOSIS — I10 Essential (primary) hypertension: Secondary | ICD-10-CM | POA: Diagnosis not present

## 2022-04-17 DIAGNOSIS — G309 Alzheimer's disease, unspecified: Secondary | ICD-10-CM | POA: Diagnosis not present

## 2022-04-17 DIAGNOSIS — K5790 Diverticulosis of intestine, part unspecified, without perforation or abscess without bleeding: Secondary | ICD-10-CM | POA: Diagnosis not present

## 2022-04-17 DIAGNOSIS — G629 Polyneuropathy, unspecified: Secondary | ICD-10-CM | POA: Diagnosis not present

## 2022-04-17 DIAGNOSIS — E039 Hypothyroidism, unspecified: Secondary | ICD-10-CM | POA: Diagnosis not present

## 2022-04-17 DIAGNOSIS — I129 Hypertensive chronic kidney disease with stage 1 through stage 4 chronic kidney disease, or unspecified chronic kidney disease: Secondary | ICD-10-CM | POA: Diagnosis not present

## 2022-04-17 DIAGNOSIS — G4700 Insomnia, unspecified: Secondary | ICD-10-CM | POA: Diagnosis not present

## 2022-04-17 DIAGNOSIS — E782 Mixed hyperlipidemia: Secondary | ICD-10-CM | POA: Diagnosis not present

## 2022-04-21 DIAGNOSIS — G309 Alzheimer's disease, unspecified: Secondary | ICD-10-CM | POA: Diagnosis not present

## 2022-04-21 DIAGNOSIS — I129 Hypertensive chronic kidney disease with stage 1 through stage 4 chronic kidney disease, or unspecified chronic kidney disease: Secondary | ICD-10-CM | POA: Diagnosis not present

## 2022-04-21 DIAGNOSIS — E782 Mixed hyperlipidemia: Secondary | ICD-10-CM | POA: Diagnosis not present

## 2022-04-21 DIAGNOSIS — E039 Hypothyroidism, unspecified: Secondary | ICD-10-CM | POA: Diagnosis not present

## 2022-05-05 DIAGNOSIS — I10 Essential (primary) hypertension: Secondary | ICD-10-CM | POA: Diagnosis not present

## 2022-05-15 DIAGNOSIS — G629 Polyneuropathy, unspecified: Secondary | ICD-10-CM | POA: Diagnosis not present

## 2022-05-15 DIAGNOSIS — I10 Essential (primary) hypertension: Secondary | ICD-10-CM | POA: Diagnosis not present

## 2022-05-15 DIAGNOSIS — E039 Hypothyroidism, unspecified: Secondary | ICD-10-CM | POA: Diagnosis not present

## 2022-05-15 DIAGNOSIS — G309 Alzheimer's disease, unspecified: Secondary | ICD-10-CM | POA: Diagnosis not present

## 2022-05-15 DIAGNOSIS — F03B0 Unspecified dementia, moderate, without behavioral disturbance, psychotic disturbance, mood disturbance, and anxiety: Secondary | ICD-10-CM | POA: Diagnosis not present

## 2022-05-15 DIAGNOSIS — G4700 Insomnia, unspecified: Secondary | ICD-10-CM | POA: Diagnosis not present

## 2022-05-15 DIAGNOSIS — K5790 Diverticulosis of intestine, part unspecified, without perforation or abscess without bleeding: Secondary | ICD-10-CM | POA: Diagnosis not present

## 2022-05-15 DIAGNOSIS — E782 Mixed hyperlipidemia: Secondary | ICD-10-CM | POA: Diagnosis not present

## 2022-05-15 DIAGNOSIS — N189 Chronic kidney disease, unspecified: Secondary | ICD-10-CM | POA: Diagnosis not present

## 2022-06-05 DIAGNOSIS — I10 Essential (primary) hypertension: Secondary | ICD-10-CM | POA: Diagnosis not present

## 2022-06-06 DIAGNOSIS — G309 Alzheimer's disease, unspecified: Secondary | ICD-10-CM | POA: Diagnosis not present

## 2022-06-06 DIAGNOSIS — N189 Chronic kidney disease, unspecified: Secondary | ICD-10-CM | POA: Diagnosis not present

## 2022-06-06 DIAGNOSIS — E782 Mixed hyperlipidemia: Secondary | ICD-10-CM | POA: Diagnosis not present

## 2022-06-06 DIAGNOSIS — I10 Essential (primary) hypertension: Secondary | ICD-10-CM | POA: Diagnosis not present

## 2022-06-06 DIAGNOSIS — I129 Hypertensive chronic kidney disease with stage 1 through stage 4 chronic kidney disease, or unspecified chronic kidney disease: Secondary | ICD-10-CM | POA: Diagnosis not present

## 2022-06-06 DIAGNOSIS — E039 Hypothyroidism, unspecified: Secondary | ICD-10-CM | POA: Diagnosis not present

## 2022-06-19 DIAGNOSIS — I10 Essential (primary) hypertension: Secondary | ICD-10-CM | POA: Diagnosis not present

## 2022-06-19 DIAGNOSIS — G4700 Insomnia, unspecified: Secondary | ICD-10-CM | POA: Diagnosis not present

## 2022-06-19 DIAGNOSIS — K5790 Diverticulosis of intestine, part unspecified, without perforation or abscess without bleeding: Secondary | ICD-10-CM | POA: Diagnosis not present

## 2022-06-19 DIAGNOSIS — E782 Mixed hyperlipidemia: Secondary | ICD-10-CM | POA: Diagnosis not present

## 2022-06-19 DIAGNOSIS — G309 Alzheimer's disease, unspecified: Secondary | ICD-10-CM | POA: Diagnosis not present

## 2022-06-19 DIAGNOSIS — N189 Chronic kidney disease, unspecified: Secondary | ICD-10-CM | POA: Diagnosis not present

## 2022-06-22 DIAGNOSIS — I10 Essential (primary) hypertension: Secondary | ICD-10-CM | POA: Diagnosis not present

## 2022-06-22 DIAGNOSIS — N189 Chronic kidney disease, unspecified: Secondary | ICD-10-CM | POA: Diagnosis not present

## 2022-06-22 DIAGNOSIS — E039 Hypothyroidism, unspecified: Secondary | ICD-10-CM | POA: Diagnosis not present

## 2022-06-22 DIAGNOSIS — I129 Hypertensive chronic kidney disease with stage 1 through stage 4 chronic kidney disease, or unspecified chronic kidney disease: Secondary | ICD-10-CM | POA: Diagnosis not present

## 2022-06-22 DIAGNOSIS — E782 Mixed hyperlipidemia: Secondary | ICD-10-CM | POA: Diagnosis not present

## 2022-06-22 DIAGNOSIS — G309 Alzheimer's disease, unspecified: Secondary | ICD-10-CM | POA: Diagnosis not present

## 2022-07-02 ENCOUNTER — Emergency Department (HOSPITAL_COMMUNITY)
Admission: EM | Admit: 2022-07-02 | Discharge: 2022-07-02 | Disposition: A | Discharge status: Home / Self Care | Payer: Medicare Other | Attending: Emergency Medicine | Admitting: Emergency Medicine

## 2022-07-02 ENCOUNTER — Emergency Department (HOSPITAL_COMMUNITY): Payer: Medicare Other

## 2022-07-02 ENCOUNTER — Encounter (HOSPITAL_COMMUNITY): Payer: Self-pay

## 2022-07-02 DIAGNOSIS — S0083XA Contusion of other part of head, initial encounter: Secondary | ICD-10-CM | POA: Insufficient documentation

## 2022-07-02 DIAGNOSIS — Z7982 Long term (current) use of aspirin: Secondary | ICD-10-CM | POA: Diagnosis not present

## 2022-07-02 DIAGNOSIS — W01198A Fall on same level from slipping, tripping and stumbling with subsequent striking against other object, initial encounter: Secondary | ICD-10-CM | POA: Diagnosis not present

## 2022-07-02 DIAGNOSIS — S0990XA Unspecified injury of head, initial encounter: Secondary | ICD-10-CM

## 2022-07-02 DIAGNOSIS — W19XXXA Unspecified fall, initial encounter: Secondary | ICD-10-CM

## 2022-07-02 DIAGNOSIS — R531 Weakness: Secondary | ICD-10-CM | POA: Diagnosis not present

## 2022-07-02 DIAGNOSIS — R5381 Other malaise: Secondary | ICD-10-CM | POA: Diagnosis not present

## 2022-07-02 DIAGNOSIS — S199XXA Unspecified injury of neck, initial encounter: Secondary | ICD-10-CM | POA: Diagnosis not present

## 2022-07-02 DIAGNOSIS — R0902 Hypoxemia: Secondary | ICD-10-CM | POA: Diagnosis not present

## 2022-07-02 DIAGNOSIS — S0003XA Contusion of scalp, initial encounter: Secondary | ICD-10-CM | POA: Diagnosis not present

## 2022-07-02 DIAGNOSIS — R296 Repeated falls: Secondary | ICD-10-CM | POA: Diagnosis not present

## 2022-07-02 DIAGNOSIS — Z7401 Bed confinement status: Secondary | ICD-10-CM | POA: Diagnosis not present

## 2022-07-02 DIAGNOSIS — Z743 Need for continuous supervision: Secondary | ICD-10-CM | POA: Diagnosis not present

## 2022-07-02 NOTE — ED Provider Notes (Signed)
[facility] EMERGENCY DEPARTMENT Provider Note   CSN: [id number] Arrival date & time: 07/02/22  0901     History  No chief complaint on file.   April [name] is a 88 y.o. female.  HPI Fall while at her facility, striking back of her head.  Presents by EMS.  Patient reported to be at her baseline.  Patient able to give some history but cannot recall the event.    Home Medications Prior to Admission medications   Medication Sig Start Date End Date Taking? Authorizing Provider  aspirin 81 MG tablet Take 81 mg by mouth daily.    [provider]  Cholecalciferol (VITAMIN D3) 2000 UNITS TABS Take 1 tablet by mouth daily.    [provider]  donepezil (ARICEPT) 10 MG tablet Take 1 tablet (10 mg total) by mouth at bedtime. 01/09/22   Daphine Deutscher, [name], FNP  ibuprofen (ADVIL,MOTRIN) 600 MG tablet Take 1 tablet (600 mg total) by mouth 2 (two) times daily. 08/26/13   Daphine Deutscher [name], FNP  levothyroxine (SYNTHROID) 75 MCG tablet Take 1 tablet (75 mcg total) by mouth daily before breakfast. 01/09/22   Daphine Deutscher, [name], FNP  lisinopril (ZESTRIL) 40 MG tablet Take 1 tablet (40 mg total) by mouth daily. 01/09/22   Daphine Deutscher [name], FNP  Melatonin 3 MG TABS Take by mouth.    [provider]  memantine (NAMENDA XR) 21 MG CP24 24 hr capsule TAKE (1) CAPSULE DAILY 01/09/22   Daphine Deutscher, [name], FNP  QUEtiapine (SEROQUEL) 25 MG tablet Take 1 tablet (25 mg total) by mouth 2 (two) times daily. 02/28/22   Daphine Deutscher, [name], FNP  sertraline (ZOLOFT) 50 MG tablet Take 1 tablet (50 mg total) by mouth daily. 01/09/22   Daphine Deutscher [name], FNP      Allergies    Acid reducer [cimetidine] and Mevacor [lovastatin]    Review of Systems   Review of Systems  Physical Exam Updated Vital Signs There were no vitals taken for this visit. Physical Exam Vitals and nursing note reviewed.  Constitutional:      General: She is not in acute distress.    Appearance:  She is well-developed. She is not ill-appearing or toxic-appearing.  HENT:     Head: Normocephalic.     Comments: Small contusion mid parietal region.  No abrasion or laceration of the scalp.  No crepitation.    Right Ear: External ear normal.     Left Ear: External ear normal.  Eyes:     Conjunctiva/sclera: Conjunctivae normal.     Pupils: Pupils are equal, round, and reactive to light.  Neck:     Trachea: Phonation normal.  Cardiovascular:     Rate and Rhythm: Normal rate and regular rhythm.     Heart sounds: Normal heart sounds.  Pulmonary:     Effort: Pulmonary effort is normal.     Breath sounds: Normal breath sounds.  Abdominal:     Palpations: Abdomen is soft.     Tenderness: There is no abdominal tenderness.  Musculoskeletal:        General: Normal range of motion.     Cervical back: Normal range of motion and neck supple.  Skin:    General: Skin is warm and dry.  Neurological:     Mental Status: She is alert.     Cranial Nerves: No cranial nerve deficit.     Sensory: No sensory deficit.     Motor: No abnormal muscle tone.     Coordination: Coordination normal.  Psychiatric:  Mood and Affect: Mood normal.        Behavior: Behavior normal.     ED Results / Procedures / Treatments   Labs (all labs ordered are listed, but only abnormal results are displayed) Labs Reviewed - No data to display  EKG None  Radiology No results found.  Procedures Procedures    Medications Ordered in ED Medications - No data to display  ED Course/ Medical Decision Making/ A&P Clinical Course as of 07/02/22 1216  Mon Jul 02, 2022  1215 She is remained stable in the ED.  I tried to reach out to a family member but no one returned the call.  We will discharge patient now. [EW]    Clinical Course User Index [EW] Mancel Bale, MD                           Medical Decision Making Fall at her facility.  On arrival she does not appear to be in pain and has a minor head  injury.  Problems Addressed: Injury of head, initial encounter: self-limited or minor problem  Amount and/or Complexity of Data Reviewed Independent Historian:     Details: The patient was unable to give any history and family member could not be contacted. Radiology: ordered and independent interpretation performed.    Details: CT head-CT cervical spine-no acute injuries.  Risk Decision regarding hospitalization. Risk Details: Patient with fall, likely mechanical, presenting with apparently baseline mental status and physical findings.  Out of a measure of prudence, CT imaging was ordered and is reassuring.  The patient does not require hospitalization.  Recommend symptomatic care.           Final Clinical Impression(s) / ED Diagnoses Final diagnoses:  None    Rx / DC Orders ED Discharge Orders     None         Mancel Bale, MD 07/02/22 1650

## 2022-07-02 NOTE — ED Notes (Signed)
[facility] called to transfer pt back to [facility]. Nurse notified

## 2022-07-02 NOTE — Discharge Instructions (Signed)
There were no serious injuries after the fall.  Return here if needed for problems.

## 2022-07-02 NOTE — ED Triage Notes (Addendum)
Pt BIBA from [facility] in [place]. Pt had a fall from standing, hitting the back of her head. Pt has small raised area to top of head. No blood thinners. No LOC

## 2022-07-03 DIAGNOSIS — R296 Repeated falls: Secondary | ICD-10-CM | POA: Diagnosis not present

## 2022-07-05 DIAGNOSIS — I10 Essential (primary) hypertension: Secondary | ICD-10-CM | POA: Diagnosis not present

## 2022-07-06 ENCOUNTER — Ambulatory Visit: Payer: Medicare Other | Admitting: Nurse Practitioner

## 2022-07-06 NOTE — Progress Notes (Deleted)
   Subjective:    Patient ID: April [name], female    DOB: Mar 15, 1932, 88 y.o.   MRN: [id number]   Chief Complaint: medical management of chronic issues     HPI:  April [name] is a 88 y.o. who identifies as a female who was assigned female at birth.   Social history: Lives with: son Work history: retired   Water engineer in today for follow up of the following chronic medical issues:  1. Primary hypertension No c/o chest pain, sob or headache. Does nt check blood pressure at home. BP Readings from Last 3 Encounters:  07/02/22 (!) 144/68  02/28/22 (!) 193/84  01/09/22 (!) 162/88     2. Diverticulosis No recent flare ups  3. Acquired hypothyroidism No problems that aware of. Lab Results  Component Value Date   TSH 3.160 01/09/2022     4. Hyperlipidemia with target LDL less than 100 Does not really watch diet.does no dedicated exercise Lab Results  Component Value Date   CHOL 273 (H) 01/09/2022   HDL 55 01/09/2022   LDLCALC 172 (H) 01/09/2022   TRIG 244 (H) 01/09/2022   CHOLHDL 5.0 (H) 01/09/2022     5. GAD (generalized anxiety disorder) ***  6. Primary insomnia ***  7. Neuropathy of both feet ***  8. Late onset Alzheimer's disease without behavioral disturbance (HCC) ***   New complaints: ***  Allergies  Allergen Reactions   Acid Reducer [Cimetidine] Nausea Only   Mevacor [Lovastatin] Nausea Only   Outpatient Encounter Medications as of 07/06/2022  Medication Sig   amLODipine (NORVASC) 5 MG tablet Take 5 mg by mouth daily.   aspirin 81 MG tablet Take 81 mg by mouth daily.   donepezil (ARICEPT) 10 MG tablet Take 1 tablet (10 mg total) by mouth at bedtime.   ibuprofen (ADVIL,MOTRIN) 600 MG tablet Take 1 tablet (600 mg total) by mouth 2 (two) times daily. (Patient not taking: Reported on 07/02/2022)   levothyroxine (SYNTHROID) 75 MCG tablet Take 1 tablet (75 mcg total) by mouth daily before breakfast.   lisinopril (ZESTRIL) 40 MG tablet Take 1 tablet  (40 mg total) by mouth daily.   Melatonin 3 MG TABS Take 3 mg by mouth at bedtime.   memantine (NAMENDA XR) 21 MG CP24 24 hr capsule TAKE (1) CAPSULE DAILY   QUEtiapine (SEROQUEL) 25 MG tablet Take 1 tablet (25 mg total) by mouth 2 (two) times daily.   sertraline (ZOLOFT) 50 MG tablet Take 1 tablet (50 mg total) by mouth daily.   No facility-administered encounter medications on file as of 07/06/2022.    Past Surgical History:  Procedure Laterality Date   FOOT SURGERY     Arthritis    HAND SURGERY     Arthritis    thumb surgery Left    [facility]    VAGINAL HYSTERECTOMY      Family History  Problem Relation Age of Onset   Pneumonia Father    Alcohol abuse Father    Dementia Sister       Controlled substance contract: ***     Review of Systems     Objective:   Physical Exam        Assessment & Plan:

## 2022-07-09 ENCOUNTER — Encounter: Payer: Self-pay | Admitting: Nurse Practitioner

## 2022-07-13 DIAGNOSIS — R011 Cardiac murmur, unspecified: Secondary | ICD-10-CM | POA: Diagnosis not present

## 2022-07-17 DIAGNOSIS — I6523 Occlusion and stenosis of bilateral carotid arteries: Secondary | ICD-10-CM | POA: Diagnosis not present

## 2022-07-17 DIAGNOSIS — R0989 Other specified symptoms and signs involving the circulatory and respiratory systems: Secondary | ICD-10-CM | POA: Diagnosis not present

## 2022-07-18 DIAGNOSIS — I129 Hypertensive chronic kidney disease with stage 1 through stage 4 chronic kidney disease, or unspecified chronic kidney disease: Secondary | ICD-10-CM | POA: Diagnosis not present

## 2022-07-18 DIAGNOSIS — I10 Essential (primary) hypertension: Secondary | ICD-10-CM | POA: Diagnosis not present

## 2022-07-18 DIAGNOSIS — E039 Hypothyroidism, unspecified: Secondary | ICD-10-CM | POA: Diagnosis not present

## 2022-07-18 DIAGNOSIS — N189 Chronic kidney disease, unspecified: Secondary | ICD-10-CM | POA: Diagnosis not present

## 2022-07-18 DIAGNOSIS — E782 Mixed hyperlipidemia: Secondary | ICD-10-CM | POA: Diagnosis not present

## 2022-07-18 DIAGNOSIS — G309 Alzheimer's disease, unspecified: Secondary | ICD-10-CM | POA: Diagnosis not present

## 2022-07-31 DIAGNOSIS — I70223 Atherosclerosis of native arteries of extremities with rest pain, bilateral legs: Secondary | ICD-10-CM | POA: Diagnosis not present

## 2022-08-05 DIAGNOSIS — I10 Essential (primary) hypertension: Secondary | ICD-10-CM | POA: Diagnosis not present

## 2022-08-07 DIAGNOSIS — K5790 Diverticulosis of intestine, part unspecified, without perforation or abscess without bleeding: Secondary | ICD-10-CM | POA: Diagnosis not present

## 2022-08-07 DIAGNOSIS — G309 Alzheimer's disease, unspecified: Secondary | ICD-10-CM | POA: Diagnosis not present

## 2022-08-07 DIAGNOSIS — G629 Polyneuropathy, unspecified: Secondary | ICD-10-CM | POA: Diagnosis not present

## 2022-08-07 DIAGNOSIS — E039 Hypothyroidism, unspecified: Secondary | ICD-10-CM | POA: Diagnosis not present

## 2022-08-07 DIAGNOSIS — E782 Mixed hyperlipidemia: Secondary | ICD-10-CM | POA: Diagnosis not present

## 2022-08-07 DIAGNOSIS — I10 Essential (primary) hypertension: Secondary | ICD-10-CM | POA: Diagnosis not present

## 2022-08-09 DIAGNOSIS — I77811 Abdominal aortic ectasia: Secondary | ICD-10-CM | POA: Diagnosis not present

## 2022-08-16 DIAGNOSIS — E782 Mixed hyperlipidemia: Secondary | ICD-10-CM | POA: Diagnosis not present

## 2022-08-16 DIAGNOSIS — E119 Type 2 diabetes mellitus without complications: Secondary | ICD-10-CM | POA: Diagnosis not present

## 2022-08-16 DIAGNOSIS — E038 Other specified hypothyroidism: Secondary | ICD-10-CM | POA: Diagnosis not present

## 2022-08-16 DIAGNOSIS — E039 Hypothyroidism, unspecified: Secondary | ICD-10-CM | POA: Diagnosis not present

## 2022-08-16 DIAGNOSIS — N189 Chronic kidney disease, unspecified: Secondary | ICD-10-CM | POA: Diagnosis not present

## 2022-08-16 DIAGNOSIS — I129 Hypertensive chronic kidney disease with stage 1 through stage 4 chronic kidney disease, or unspecified chronic kidney disease: Secondary | ICD-10-CM | POA: Diagnosis not present

## 2022-08-16 DIAGNOSIS — I10 Essential (primary) hypertension: Secondary | ICD-10-CM | POA: Diagnosis not present

## 2022-08-16 DIAGNOSIS — G309 Alzheimer's disease, unspecified: Secondary | ICD-10-CM | POA: Diagnosis not present

## 2022-08-16 DIAGNOSIS — D518 Other vitamin B12 deficiency anemias: Secondary | ICD-10-CM | POA: Diagnosis not present

## 2022-08-16 DIAGNOSIS — E559 Vitamin D deficiency, unspecified: Secondary | ICD-10-CM | POA: Diagnosis not present

## 2022-08-27 DIAGNOSIS — E782 Mixed hyperlipidemia: Secondary | ICD-10-CM | POA: Diagnosis not present

## 2022-08-27 DIAGNOSIS — E038 Other specified hypothyroidism: Secondary | ICD-10-CM | POA: Diagnosis not present

## 2022-08-27 DIAGNOSIS — E119 Type 2 diabetes mellitus without complications: Secondary | ICD-10-CM | POA: Diagnosis not present

## 2022-08-27 DIAGNOSIS — E559 Vitamin D deficiency, unspecified: Secondary | ICD-10-CM | POA: Diagnosis not present

## 2022-08-27 DIAGNOSIS — D518 Other vitamin B12 deficiency anemias: Secondary | ICD-10-CM | POA: Diagnosis not present

## 2022-08-27 DIAGNOSIS — Z79899 Other long term (current) drug therapy: Secondary | ICD-10-CM | POA: Diagnosis not present

## 2022-09-04 DIAGNOSIS — N189 Chronic kidney disease, unspecified: Secondary | ICD-10-CM | POA: Diagnosis not present

## 2022-09-04 DIAGNOSIS — I10 Essential (primary) hypertension: Secondary | ICD-10-CM | POA: Diagnosis not present

## 2022-09-04 DIAGNOSIS — E782 Mixed hyperlipidemia: Secondary | ICD-10-CM | POA: Diagnosis not present

## 2022-09-04 DIAGNOSIS — G629 Polyneuropathy, unspecified: Secondary | ICD-10-CM | POA: Diagnosis not present

## 2022-09-04 DIAGNOSIS — E039 Hypothyroidism, unspecified: Secondary | ICD-10-CM | POA: Diagnosis not present

## 2022-09-04 DIAGNOSIS — K5790 Diverticulosis of intestine, part unspecified, without perforation or abscess without bleeding: Secondary | ICD-10-CM | POA: Diagnosis not present

## 2022-09-05 DIAGNOSIS — I10 Essential (primary) hypertension: Secondary | ICD-10-CM | POA: Diagnosis not present

## 2022-09-11 DIAGNOSIS — Z79899 Other long term (current) drug therapy: Secondary | ICD-10-CM | POA: Diagnosis not present

## 2022-09-11 DIAGNOSIS — E782 Mixed hyperlipidemia: Secondary | ICD-10-CM | POA: Diagnosis not present

## 2022-09-17 DIAGNOSIS — G309 Alzheimer's disease, unspecified: Secondary | ICD-10-CM | POA: Diagnosis not present

## 2022-09-17 DIAGNOSIS — I129 Hypertensive chronic kidney disease with stage 1 through stage 4 chronic kidney disease, or unspecified chronic kidney disease: Secondary | ICD-10-CM | POA: Diagnosis not present

## 2022-09-17 DIAGNOSIS — N189 Chronic kidney disease, unspecified: Secondary | ICD-10-CM | POA: Diagnosis not present

## 2022-09-17 DIAGNOSIS — E038 Other specified hypothyroidism: Secondary | ICD-10-CM | POA: Diagnosis not present

## 2022-09-17 DIAGNOSIS — E559 Vitamin D deficiency, unspecified: Secondary | ICD-10-CM | POA: Diagnosis not present

## 2022-09-17 DIAGNOSIS — D518 Other vitamin B12 deficiency anemias: Secondary | ICD-10-CM | POA: Diagnosis not present

## 2022-09-17 DIAGNOSIS — E119 Type 2 diabetes mellitus without complications: Secondary | ICD-10-CM | POA: Diagnosis not present

## 2022-09-17 DIAGNOSIS — I10 Essential (primary) hypertension: Secondary | ICD-10-CM | POA: Diagnosis not present

## 2022-09-17 DIAGNOSIS — E782 Mixed hyperlipidemia: Secondary | ICD-10-CM | POA: Diagnosis not present

## 2022-09-17 DIAGNOSIS — E039 Hypothyroidism, unspecified: Secondary | ICD-10-CM | POA: Diagnosis not present

## 2022-10-05 DIAGNOSIS — I10 Essential (primary) hypertension: Secondary | ICD-10-CM | POA: Diagnosis not present

## 2022-10-15 DIAGNOSIS — M79672 Pain in left foot: Secondary | ICD-10-CM | POA: Diagnosis not present

## 2022-10-15 DIAGNOSIS — B351 Tinea unguium: Secondary | ICD-10-CM | POA: Diagnosis not present

## 2022-10-15 DIAGNOSIS — L600 Ingrowing nail: Secondary | ICD-10-CM | POA: Diagnosis not present

## 2022-10-15 DIAGNOSIS — M79671 Pain in right foot: Secondary | ICD-10-CM | POA: Diagnosis not present

## 2022-10-16 DIAGNOSIS — E782 Mixed hyperlipidemia: Secondary | ICD-10-CM | POA: Diagnosis not present

## 2022-10-16 DIAGNOSIS — E039 Hypothyroidism, unspecified: Secondary | ICD-10-CM | POA: Diagnosis not present

## 2022-10-16 DIAGNOSIS — G309 Alzheimer's disease, unspecified: Secondary | ICD-10-CM | POA: Diagnosis not present

## 2022-10-16 DIAGNOSIS — I10 Essential (primary) hypertension: Secondary | ICD-10-CM | POA: Diagnosis not present

## 2022-10-16 DIAGNOSIS — N189 Chronic kidney disease, unspecified: Secondary | ICD-10-CM | POA: Diagnosis not present

## 2022-10-16 DIAGNOSIS — G4700 Insomnia, unspecified: Secondary | ICD-10-CM | POA: Diagnosis not present

## 2022-10-16 DIAGNOSIS — K5790 Diverticulosis of intestine, part unspecified, without perforation or abscess without bleeding: Secondary | ICD-10-CM | POA: Diagnosis not present

## 2022-10-17 DIAGNOSIS — E039 Hypothyroidism, unspecified: Secondary | ICD-10-CM | POA: Diagnosis not present

## 2022-10-17 DIAGNOSIS — E559 Vitamin D deficiency, unspecified: Secondary | ICD-10-CM | POA: Diagnosis not present

## 2022-10-17 DIAGNOSIS — N189 Chronic kidney disease, unspecified: Secondary | ICD-10-CM | POA: Diagnosis not present

## 2022-10-17 DIAGNOSIS — I10 Essential (primary) hypertension: Secondary | ICD-10-CM | POA: Diagnosis not present

## 2022-10-17 DIAGNOSIS — D518 Other vitamin B12 deficiency anemias: Secondary | ICD-10-CM | POA: Diagnosis not present

## 2022-10-17 DIAGNOSIS — E119 Type 2 diabetes mellitus without complications: Secondary | ICD-10-CM | POA: Diagnosis not present

## 2022-10-17 DIAGNOSIS — E038 Other specified hypothyroidism: Secondary | ICD-10-CM | POA: Diagnosis not present

## 2022-10-17 DIAGNOSIS — E782 Mixed hyperlipidemia: Secondary | ICD-10-CM | POA: Diagnosis not present

## 2022-10-17 DIAGNOSIS — G309 Alzheimer's disease, unspecified: Secondary | ICD-10-CM | POA: Diagnosis not present

## 2022-10-17 DIAGNOSIS — I129 Hypertensive chronic kidney disease with stage 1 through stage 4 chronic kidney disease, or unspecified chronic kidney disease: Secondary | ICD-10-CM | POA: Diagnosis not present

## 2022-11-02 ENCOUNTER — Encounter (HOSPITAL_COMMUNITY): Payer: Self-pay

## 2022-11-02 ENCOUNTER — Emergency Department (HOSPITAL_COMMUNITY)
Admission: EM | Admit: 2022-11-02 | Discharge: 2022-11-02 | Disposition: A | Discharge status: Home / Self Care | Payer: Medicare Other | Attending: Emergency Medicine | Admitting: Emergency Medicine

## 2022-11-02 ENCOUNTER — Other Ambulatory Visit: Payer: Self-pay

## 2022-11-02 DIAGNOSIS — R42 Dizziness and giddiness: Secondary | ICD-10-CM | POA: Insufficient documentation

## 2022-11-02 DIAGNOSIS — E039 Hypothyroidism, unspecified: Secondary | ICD-10-CM | POA: Insufficient documentation

## 2022-11-02 DIAGNOSIS — Z79899 Other long term (current) drug therapy: Secondary | ICD-10-CM | POA: Diagnosis not present

## 2022-11-02 DIAGNOSIS — R410 Disorientation, unspecified: Secondary | ICD-10-CM | POA: Diagnosis not present

## 2022-11-02 DIAGNOSIS — Z743 Need for continuous supervision: Secondary | ICD-10-CM | POA: Diagnosis not present

## 2022-11-02 DIAGNOSIS — F0390 Unspecified dementia without behavioral disturbance: Secondary | ICD-10-CM | POA: Diagnosis not present

## 2022-11-02 DIAGNOSIS — I10 Essential (primary) hypertension: Secondary | ICD-10-CM | POA: Insufficient documentation

## 2022-11-02 DIAGNOSIS — R52 Pain, unspecified: Secondary | ICD-10-CM | POA: Diagnosis not present

## 2022-11-02 DIAGNOSIS — I959 Hypotension, unspecified: Secondary | ICD-10-CM | POA: Diagnosis not present

## 2022-11-02 DIAGNOSIS — R001 Bradycardia, unspecified: Secondary | ICD-10-CM | POA: Diagnosis not present

## 2022-11-02 DIAGNOSIS — R404 Transient alteration of awareness: Secondary | ICD-10-CM | POA: Diagnosis not present

## 2022-11-02 DIAGNOSIS — R531 Weakness: Secondary | ICD-10-CM | POA: Insufficient documentation

## 2022-11-02 DIAGNOSIS — R6889 Other general symptoms and signs: Secondary | ICD-10-CM | POA: Diagnosis not present

## 2022-11-02 DIAGNOSIS — R0689 Other abnormalities of breathing: Secondary | ICD-10-CM | POA: Diagnosis not present

## 2022-11-02 DIAGNOSIS — Z7982 Long term (current) use of aspirin: Secondary | ICD-10-CM | POA: Insufficient documentation

## 2022-11-02 HISTORY — DX: Dementia in other diseases classified elsewhere, unspecified severity, without behavioral disturbance, psychotic disturbance, mood disturbance, and anxiety: F02.80

## 2022-11-02 LAB — BASIC METABOLIC PANEL
Anion gap: 11 (ref 5–15)
BUN: 15 mg/dL (ref 8–23)
CO2: 27 mmol/L (ref 22–32)
Calcium: 8.8 mg/dL — ABNORMAL LOW (ref 8.9–10.3)
Chloride: 105 mmol/L (ref 98–111)
Creatinine, Ser: 0.79 mg/dL (ref 0.44–1.00)
GFR, Estimated: >60 mL/min (ref >60)
Glucose, Bld: 97 mg/dL (ref 70–99)
Potassium: 3.7 mmol/L (ref 3.5–5.1)
Sodium: 143 mmol/L (ref 135–145)

## 2022-11-02 LAB — URINALYSIS, ROUTINE W REFLEX MICROSCOPIC
Bilirubin Urine: NEGATIVE
Glucose, UA: NEGATIVE mg/dL
Hgb urine dipstick: NEGATIVE
Ketones, ur: 5 mg/dL — AB
Leukocytes,Ua: NEGATIVE
Nitrite: NEGATIVE
Protein, ur: NEGATIVE mg/dL
Specific Gravity, Urine: 1.01 (ref 1.005–1.030)
pH: 5 (ref 5.0–8.0)

## 2022-11-02 LAB — CBC WITH DIFFERENTIAL/PLATELET
Abs Immature Granulocytes: 0.04 10*3/uL (ref 0.00–0.07)
Basophils Absolute: 0.1 10*3/uL (ref 0.0–0.1)
Basophils Relative: 1 %
Eosinophils Absolute: 0.3 10*3/uL (ref 0.0–0.5)
Eosinophils Relative: 3 %
HCT: 38.4 % (ref 36.0–46.0)
Hemoglobin: 12.4 g/dL (ref 12.0–15.0)
Immature Granulocytes: 1 %
Lymphocytes Relative: 15 %
Lymphs Abs: 1.3 10*3/uL (ref 0.7–4.0)
MCH: 30.8 pg (ref 26.0–34.0)
MCHC: 32.3 g/dL (ref 30.0–36.0)
MCV: 95.3 fL (ref 80.0–100.0)
Monocytes Absolute: 0.9 10*3/uL (ref 0.1–1.0)
Monocytes Relative: 11 %
Neutro Abs: 6 10*3/uL (ref 1.7–7.7)
Neutrophils Relative %: 69 %
Platelets: 352 10*3/uL (ref 150–400)
RBC: 4.03 MIL/uL (ref 3.87–5.11)
RDW: 13.2 % (ref 11.5–15.5)
WBC: 8.6 10*3/uL (ref 4.0–10.5)
nRBC: 0 % (ref 0.0–0.2)

## 2022-11-02 LAB — CBG MONITORING, ED: Glucose-Capillary: 89 mg/dL (ref 70–99)

## 2022-11-02 MED ORDER — SODIUM CHLORIDE 0.9 % IV BOLUS
500.0000 mL | Freq: Once | INTRAVENOUS | Status: AC
  Administered 2022-11-02: 500 mL via INTRAVENOUS

## 2022-11-02 NOTE — ED Notes (Signed)
Pt ambulates without any issues.

## 2022-11-02 NOTE — ED Triage Notes (Signed)
Facility called [facility] for hypotension. [facility] noted bradycardia but not hypotensive. Pt is weak and gait is unsteady when ambulating per [facility]. Pt has dementia.

## 2022-11-02 NOTE — Discharge Instructions (Addendum)
Your lab test and exam today are reassuring, however your pulse rate does remain mildly lower than what is considered normal and normal pulse rate is between 60 and 100 and your average pulse rate here has been 55.  Normally this is not enough of the abnormality to cause symptoms but given we have found no other reason for your symptoms we recommend further evaluation by a cardiologist to determine if this low heart rate could be causing you to have weakness.  You are being referred to cardiology, and should receive a call from them by Monday to arrange an appointment.  In the interim, be careful with standing too quickly, hold onto the chair or table for minutes prior to walking and making sure you are not feeling lightheaded.

## 2022-11-02 NOTE — ED Notes (Signed)
Pt ambulated to BR and back to room without difficultly and steady gait.

## 2022-11-02 NOTE — ED Provider Notes (Signed)
[facility] EMERGENCY DEPARTMENT Provider Note   CSN: [id number] Arrival date & time: 11/02/22  0920     History  Chief Complaint  Patient presents with   Weakness    April [name] is a 88 y.o. female presenting from a local nursing home, past medical history significant for Alzheimer's disease who appears to have pleasant dementia, hypertension, hyperlipidemia, hypothyroidism presenting for evaluation of a low blood pressure and complaint of feeling dizzy prior to arrival.  The nursing staff at her facility obtained a blood pressure of 90/53, she also has bradycardia, her current pulse rate is 51.  EMS was called at which time there blood pressure check was normal.  Patient does not recall any symptoms at this time, she does state sometimes "I get a little dizzy " but endorses she feels good now.  There have been no documented fevers, no nausea or vomiting, she denies chest pain, shortness of breath, abdominal pain, diarrhea or dysuria.  Call placed to Tiffany, pt's RN at [facility], describing pt was very unsteady on her feet, normally walks very independently without assistance, she started to collapse and was holding onto a fellow resident before RN could assist, was at this time that vital signs were taken and she was found to be hypotensive and bradycardic.  She has had her normal morning meds and has had no recent changes in her medications.  She did not have LOC, but was noted to have difficulty keeping her eyes open during this event.    The history is provided by the patient, the nursing home and medical records.       Home Medications Prior to Admission medications   Medication Sig Start Date End Date Taking? Authorizing Provider  amLODipine (NORVASC) 5 MG tablet Take 5 mg by mouth daily.    [provider]  aspirin 81 MG tablet Take 81 mg by mouth daily.    [provider]  donepezil (ARICEPT) 10 MG tablet Take 1 tablet (10 mg total) by mouth at bedtime.  01/09/22   Hassell Done, [name], FNP  ibuprofen (ADVIL,MOTRIN) 600 MG tablet Take 1 tablet (600 mg total) by mouth 2 (two) times daily. Patient not taking: Reported on 07/02/2022 08/26/13   Chevis Pretty, FNP  levothyroxine (SYNTHROID) 75 MCG tablet Take 1 tablet (75 mcg total) by mouth daily before breakfast. 01/09/22   Hassell Done, [name], FNP  lisinopril (ZESTRIL) 40 MG tablet Take 1 tablet (40 mg total) by mouth daily. 01/09/22   Hassell Done, [name], FNP  Melatonin 3 MG TABS Take 3 mg by mouth at bedtime.    [provider]  memantine (NAMENDA XR) 21 MG CP24 24 hr capsule TAKE (1) CAPSULE DAILY 01/09/22   Hassell Done, [name], FNP  QUEtiapine (SEROQUEL) 25 MG tablet Take 1 tablet (25 mg total) by mouth 2 (two) times daily. 02/28/22   Hassell Done, [name], FNP  sertraline (ZOLOFT) 50 MG tablet Take 1 tablet (50 mg total) by mouth daily. 01/09/22   Hassell Done [name], FNP      Allergies    Acid reducer [cimetidine] and Mevacor [lovastatin]    Review of Systems   Review of Systems  Unable to perform ROS: Dementia  Neurological:  Positive for weakness.    Physical Exam Updated Vital Signs BP (!) 141/70   Pulse (!) 54   Temp 98.4 F (36.9 C) (Oral)   Resp 14   Ht _0  (1.651 m)   Wt 47.7 kg   SpO2 94%   BMI 17.49 kg/m  Physical Exam Vitals and nursing note reviewed.  Constitutional:      General: She is not in acute distress.    Appearance: She is well-developed.  HENT:     Head: Normocephalic and atraumatic.  Eyes:     Conjunctiva/sclera: Conjunctivae normal.  Cardiovascular:     Rate and Rhythm: Regular rhythm. Bradycardia present.     Heart sounds: Normal heart sounds.  Pulmonary:     Effort: Pulmonary effort is normal.     Breath sounds: Normal breath sounds. No wheezing.  Abdominal:     General: Bowel sounds are normal.     Palpations: Abdomen is soft.     Tenderness: There is no abdominal tenderness. There is no guarding or rebound.   Musculoskeletal:        General: Normal range of motion.     Cervical back: Normal range of motion.  Skin:    General: Skin is warm and dry.  Neurological:     General: No focal deficit present.     Mental Status: She is alert.     Comments: Pleasantly confused     ED Results / Procedures / Treatments   Labs (all labs ordered are listed, but only abnormal results are displayed) Labs Reviewed  BASIC METABOLIC PANEL - Abnormal; Notable for the following components:      Result Value   Calcium 8.8 (*)    All other components within normal limits  URINALYSIS, ROUTINE W REFLEX MICROSCOPIC - Abnormal; Notable for the following components:   Ketones, ur 5 (*)    All other components within normal limits  CBC WITH DIFFERENTIAL/PLATELET  CBG MONITORING, ED    EKG None  Radiology No results found.  Procedures Procedures    Medications Ordered in ED Medications  sodium chloride 0.9 % bolus 500 mL (0 mLs Intravenous Stopped 11/02/22 1147)    ED Course/ Medical Decision Making/ A&P                           Medical Decision Making Patient presenting with lightheadedness, near fall per nursing home staff and a documented low blood pressure immediately after the event.  She remained stable while here, her pressure has been stable additionally, however we have noted her to have a mild bradycardia average 55 beats a minute, is unclear whether this may be the source of her symptoms.  She was ambulated in the department and was stable with ambulation.  She will be discharged home but was given referral to cardiology for follow-up care.  Amount and/or Complexity of Data Reviewed Labs: ordered.    Details: Labs are stable including CBC, c-Met and urinalysis ECG/medicine tests: ordered and independent interpretation performed.    Details: Sinus bradycardia, first-degree block.  Risk Decision regarding hospitalization.           Final Clinical Impression(s) / ED  Diagnoses Final diagnoses:  Weakness  Bradycardia    Rx / DC Orders ED Discharge Orders          Ordered    Ambulatory referral to Cardiology       Comments: If you have not heard from the Cardiology office within the next 72 hours please call [phone].   11/02/22 1459              Evalee Jefferson, PA-C 11/02/22 1502    Milton Ferguson, MD 11/03/22 [phone]

## 2022-11-02 NOTE — ED Notes (Signed)
Patient given word search activity to help with confusion. Patient is in room content with word search. Nurse notified.

## 2022-11-02 NOTE — ED Notes (Signed)
Pt confused and unable to sign MSE

## 2022-11-06 DIAGNOSIS — R296 Repeated falls: Secondary | ICD-10-CM | POA: Diagnosis not present

## 2022-11-06 DIAGNOSIS — G309 Alzheimer's disease, unspecified: Secondary | ICD-10-CM | POA: Diagnosis not present

## 2022-11-25 ENCOUNTER — Emergency Department (HOSPITAL_COMMUNITY): Payer: Medicare Other

## 2022-11-25 ENCOUNTER — Encounter (HOSPITAL_COMMUNITY): Payer: Self-pay | Admitting: *Deleted

## 2022-11-25 ENCOUNTER — Other Ambulatory Visit: Payer: Self-pay

## 2022-11-25 ENCOUNTER — Emergency Department (HOSPITAL_COMMUNITY)
Admission: EM | Admit: 2022-11-25 | Discharge: 2022-11-25 | Disposition: A | Discharge status: Skilled Nursing Facility | Payer: Medicare Other | Attending: Emergency Medicine | Admitting: Emergency Medicine

## 2022-11-25 DIAGNOSIS — F0280 Dementia in other diseases classified elsewhere without behavioral disturbance: Secondary | ICD-10-CM | POA: Diagnosis present

## 2022-11-25 DIAGNOSIS — I10 Essential (primary) hypertension: Secondary | ICD-10-CM | POA: Diagnosis not present

## 2022-11-25 DIAGNOSIS — N390 Urinary tract infection, site not specified: Secondary | ICD-10-CM | POA: Diagnosis present

## 2022-11-25 DIAGNOSIS — Z79899 Other long term (current) drug therapy: Secondary | ICD-10-CM | POA: Diagnosis not present

## 2022-11-25 DIAGNOSIS — Z7982 Long term (current) use of aspirin: Secondary | ICD-10-CM | POA: Insufficient documentation

## 2022-11-25 DIAGNOSIS — R55 Syncope and collapse: Secondary | ICD-10-CM | POA: Diagnosis not present

## 2022-11-25 DIAGNOSIS — E039 Hypothyroidism, unspecified: Secondary | ICD-10-CM | POA: Diagnosis not present

## 2022-11-25 DIAGNOSIS — Z743 Need for continuous supervision: Secondary | ICD-10-CM | POA: Diagnosis not present

## 2022-11-25 DIAGNOSIS — R001 Bradycardia, unspecified: Secondary | ICD-10-CM | POA: Diagnosis not present

## 2022-11-25 DIAGNOSIS — R404 Transient alteration of awareness: Secondary | ICD-10-CM | POA: Diagnosis not present

## 2022-11-25 DIAGNOSIS — N3000 Acute cystitis without hematuria: Secondary | ICD-10-CM | POA: Diagnosis not present

## 2022-11-25 DIAGNOSIS — E038 Other specified hypothyroidism: Secondary | ICD-10-CM

## 2022-11-25 DIAGNOSIS — G309 Alzheimer's disease, unspecified: Secondary | ICD-10-CM | POA: Insufficient documentation

## 2022-11-25 DIAGNOSIS — I959 Hypotension, unspecified: Secondary | ICD-10-CM | POA: Insufficient documentation

## 2022-11-25 LAB — URINALYSIS, ROUTINE W REFLEX MICROSCOPIC
Bilirubin Urine: NEGATIVE
Glucose, UA: NEGATIVE mg/dL
Hgb urine dipstick: NEGATIVE
Ketones, ur: NEGATIVE mg/dL
Nitrite: NEGATIVE
Protein, ur: 30 mg/dL — AB
Specific Gravity, Urine: 1.018 (ref 1.005–1.030)
pH: 8 (ref 5.0–8.0)

## 2022-11-25 LAB — CBC WITH DIFFERENTIAL/PLATELET
Abs Immature Granulocytes: 0.02 10*3/uL (ref 0.00–0.07)
Basophils Absolute: 0.1 10*3/uL (ref 0.0–0.1)
Basophils Relative: 1 %
Eosinophils Absolute: 0.3 10*3/uL (ref 0.0–0.5)
Eosinophils Relative: 4 %
HCT: 38.7 % (ref 36.0–46.0)
Hemoglobin: 12.6 g/dL (ref 12.0–15.0)
Immature Granulocytes: 0 %
Lymphocytes Relative: 13 %
Lymphs Abs: 1 10*3/uL (ref 0.7–4.0)
MCH: 31 pg (ref 26.0–34.0)
MCHC: 32.6 g/dL (ref 30.0–36.0)
MCV: 95.1 fL (ref 80.0–100.0)
Monocytes Absolute: 0.7 10*3/uL (ref 0.1–1.0)
Monocytes Relative: 9 %
Neutro Abs: 5.8 10*3/uL (ref 1.7–7.7)
Neutrophils Relative %: 73 %
Platelets: 271 10*3/uL (ref 150–400)
RBC: 4.07 MIL/uL (ref 3.87–5.11)
RDW: 13.3 % (ref 11.5–15.5)
WBC: 8 10*3/uL (ref 4.0–10.5)
nRBC: 0 % (ref 0.0–0.2)

## 2022-11-25 LAB — BASIC METABOLIC PANEL
Anion gap: 7 (ref 5–15)
BUN: 20 mg/dL (ref 8–23)
CO2: 28 mmol/L (ref 22–32)
Calcium: 8.9 mg/dL (ref 8.9–10.3)
Chloride: 105 mmol/L (ref 98–111)
Creatinine, Ser: 0.86 mg/dL (ref 0.44–1.00)
GFR, Estimated: >60 mL/min (ref >60)
Glucose, Bld: 114 mg/dL — ABNORMAL HIGH (ref 70–99)
Potassium: 3.8 mmol/L (ref 3.5–5.1)
Sodium: 140 mmol/L (ref 135–145)

## 2022-11-25 LAB — TROPONIN I (HIGH SENSITIVITY)
Troponin I (High Sensitivity): 4 ng/L (ref <18)
Troponin I (High Sensitivity): 4 ng/L (ref <18)

## 2022-11-25 LAB — TSH: TSH: 0.477 u[IU]/mL (ref 0.350–4.500)

## 2022-11-25 MED ORDER — SODIUM CHLORIDE 0.9 % IV SOLN
1.0000 g | INTRAVENOUS | Status: DC
  Filled 2022-11-25: qty 10

## 2022-11-25 MED ORDER — LISINOPRIL 20 MG PO TABS: 20.0000 mg | ORAL_TABLET | Freq: Every day | ORAL | 2 refills | Status: DC

## 2022-11-25 MED ORDER — CEPHALEXIN 500 MG PO CAPS
500.0000 mg | ORAL_CAPSULE | Freq: Once | ORAL | Status: AC
  Administered 2022-11-25: 500 mg via ORAL
  Filled 2022-11-25: qty 1

## 2022-11-25 MED ORDER — CEPHALEXIN 500 MG PO CAPS: 500.0000 mg | ORAL_CAPSULE | Freq: Three times a day (TID) | ORAL | 0 refills | Status: AC

## 2022-11-25 MED ORDER — QUETIAPINE FUMARATE 25 MG PO TABS: 25.0000 mg | ORAL_TABLET | Freq: Every day | ORAL | 2 refills | Status: DC

## 2022-11-25 MED ORDER — ASPIRIN 81 MG PO TABS: 81.0000 mg | ORAL_TABLET | Freq: Every day | ORAL | 3 refills | Status: DC

## 2022-11-25 NOTE — Consult Note (Addendum)
Patient Demographics:    April [name], is a 88 y.o. female  MRN: [id number]   DOB - 01-28-1932  Admit Date - 11/25/2022  Outpatient Primary MD for the patient is Hague, Myrene Galas, MD   Assessment & Plan:   Assessment and Plan:    1)Possible Syncopal event--- baseline sinus bradycardia -No chest pains or dyspnea no headaches no dizziness no neck pain, No visual disturbance or strokelike symptoms -Within a minute of walking in place patient's heart rate goes into the 70s--- heart rate responds appropriately to positional change and activity -Orthostatic Vitals :- Laying---- 135/53, HR 54 Sitting------133/62, HR 53 Standing---after 3 mins --135/67, HR 60 patient is a poor historian due to underlying dementia -Troponin is 4 , repeat is 4 -Serial EKGs show sinus bradycardia with heart rate from high 40s to high 50s -CBC WNL, with hemoglobin of 12.6 -Potassium is 3.8 sodium is 140 bicarb is 28 creatinine 0.86 glucose 114 -Chest x-ray without acute cardiopulmonary findings -TSH is pending patient is on levothyroxine -Patient had similar episode of concerns about syncope back in November -She has a follow-up cardiology appointment pending with Dr. Enid Derry on December 11, 2022 in [place] -Anticipate the patient will need echocardiogram and 30-day Holter/Zio patch monitor  2)Possible UTI--- Keflex as prescribed... Pending culture data  3)HTN-decrease lisinopril to 20 mg daily, continue amlodipine 5 mg daily as amlodipine may actually helped bradycardia somewhat--- as it can at times cause reflex tachycardia  4)Hypothyroidism--- continue levothyroxine repeat TSH pending  5)Dementia--with mild to moderate cognitive and memory deficits -Continue Aricept and Namenda -Reduce Seroquel to 25 mg nightly from 25 mg twice daily  to avoid daytime sleepiness can be contributing to falls and passing out episodes  6)Falls/??? Syncope----Home health PT requested to improve gait and stability  Discharge instructions:-  1) keep appointment with cardiologist Dr. Kem Boroughs in [place] on December 11, 2022----anticipate he will need an echocardiogram and a 30-day heart monitor device  2) Home health physical therapy and registered nurse has been requested for you  3) Seroquel has been reduced to 25 mg at bedtime only from 25 mg twice daily to avoid daytime sleepiness/drowsiness and falls or syncope  4)Your TSH thyroid test is still pending at the time of the discharge --your primary care physician can get the results of your thyroid test test and adjust your thyroid medication accordingly  Allergies as of 11/25/2022       Reactions   Acid Reducer [cimetidine] Nausea Only   Mevacor [lovastatin] Nausea Only        Medication List     TAKE these medications    amLODipine 5 MG tablet Commonly known as: NORVASC Take 5 mg by mouth daily.   aspirin 81 MG tablet Take 1 tablet (81 mg total) by mouth daily with breakfast. What changed: when to take this   atorvastatin 20 MG tablet Commonly known as: LIPITOR Take 20 mg by mouth daily.  cephALEXin 500 MG capsule Commonly known as: Keflex Take 1 capsule (500 mg total) by mouth 3 (three) times daily for 3 days.   donepezil 10 MG tablet Commonly known as: ARICEPT Take 1 tablet (10 mg total) by mouth at bedtime.   levothyroxine 75 MCG tablet Commonly known as: SYNTHROID Take 1 tablet (75 mcg total) by mouth daily before breakfast.   lisinopril 20 MG tablet Commonly known as: ZESTRIL Take 1 tablet (20 mg total) by mouth daily. What changed:  medication strength how much to take   memantine 21 MG Cp24 24 hr capsule Commonly known as: NAMENDA XR TAKE (1) CAPSULE DAILY   QUEtiapine 25 MG tablet Commonly known as: SEROquel Take 1 tablet (25 mg total) by  mouth at bedtime. What changed: when to take this   sertraline 50 MG tablet Commonly known as: ZOLOFT Take 1 tablet (50 mg total) by mouth daily.       Dispo: The patient is from: ALF  [facility]              Anticipated d/c is to: ALF     With History of - Reviewed by me  Past Medical History:  Diagnosis Date   Alzheimer disease (HCC)    Arthritis    Cognitive changes    Hyperlipidemia    Hypertension    Thyroid disease       Past Surgical History:  Procedure Laterality Date   FOOT SURGERY     Arthritis    HAND SURGERY     Arthritis    thumb surgery Left    [facility]    VAGINAL HYSTERECTOMY      Chief Complaint  Patient presents with   Loss of Consciousness      HPI:    April [name]  is a 88 y.o. female with past medical history relevant for dementia, HTN and hypothyroidism presents from [facility] assisted [facility] facility with concerns of possible syncope/passing out episode- In the ED patient is found to have baseline sinus bradycardia---with heart rate in the high 40s and low 50s please see serial EKG on file with sinus bradycardia -Troponin is 4 repeat troponin is still 4 -In the ED while walking in place for less than a minute patient's heart rate would go up into the high 60s and 70s.... Patient's heart rate responds appropriately to positional change and activity -Orthostatic vitals noted and essentially WNL -No chest pains or dyspnea no headaches no dizziness no neck pain, No visual disturbance or strokelike symptoms -UA suggestive of UTI -CBC WNL -Electrolytes WNL creatinine WNL -Chest x-ray without acute cardiopulmonary findings    Review of systems:    In addition to the HPI above,   A full Review of  Systems was done, all other systems reviewed are negative except as noted above in HPI , .    Social History:  Reviewed by me    Social History   Tobacco Use   Smoking status: Former   Smokeless tobacco: Never  Substance Use Topics    Alcohol use: Not Currently    Alcohol/week: 1.0 standard drink of alcohol    Types: 1 Glasses of wine per week    Comment: socially       Family History :  Reviewed by me    Family History  Problem Relation Age of Onset   Pneumonia Father    Alcohol abuse Father    Dementia Sister     Home Medications:   Prior to Admission  medications   Medication Sig Start Date End Date Taking? Authorizing Provider  amLODipine (NORVASC) 5 MG tablet Take 5 mg by mouth daily.   Yes [provider]  atorvastatin (LIPITOR) 20 MG tablet Take 20 mg by mouth daily. 11/20/22  Yes [provider]  donepezil (ARICEPT) 10 MG tablet Take 1 tablet (10 mg total) by mouth at bedtime. 01/09/22  Yes Daphine Deutscher, [name], FNP  levothyroxine (SYNTHROID) 75 MCG tablet Take 1 tablet (75 mcg total) by mouth daily before breakfast. 01/09/22  Yes Daphine Deutscher, [name], FNP  memantine (NAMENDA XR) 21 MG CP24 24 hr capsule TAKE (1) CAPSULE DAILY 01/09/22  Yes Daphine Deutscher, [name], FNP  sertraline (ZOLOFT) 50 MG tablet Take 1 tablet (50 mg total) by mouth daily. 01/09/22  Yes Daphine Deutscher, [name], FNP  aspirin 81 MG tablet Take 1 tablet (81 mg total) by mouth daily with breakfast. 11/25/22   Carlitos Bottino, MD  lisinopril (ZESTRIL) 20 MG tablet Take 1 tablet (20 mg total) by mouth daily. 11/25/22   Shon Hale, MD  QUEtiapine (SEROQUEL) 25 MG tablet Take 1 tablet (25 mg total) by mouth at bedtime. 11/25/22   Shon Hale, MD     Allergies:     Allergies  Allergen Reactions   Acid Reducer [Cimetidine] Nausea Only   Mevacor [Lovastatin] Nausea Only     Physical Exam:   Vitals  BP (!) 146/70   Pulse (!) 54   Temp 97.9 F (36.6 C) (Oral)   Resp 15   Ht 5\' 5"  (1.651 m)   Wt 47.7 kg   SpO2 97%   BMI 17.49 kg/m   Physical Examination: General appearance - alert,  in no distress  Mental status - alert, oriented to person, place, and time, poor recall and some forgetfulness  noted Eyes - sclera anicteric Neck - supple, no JVD elevation , Chest - clear  to auscultation bilaterally, symmetrical air movement,  Heart - S1 and S2 normal, regular  Abdomen - soft, nontender, nondistended, +BS Neurological - screening mental status exam normal, neck supple without rigidity, cranial nerves II through XII intact, DTR's normal and symmetric Extremities - no pedal edema noted, intact peripheral pulses  Skin - warm, dry     Data Review:    CBC Recent Labs  Lab 11/25/22 1037  WBC 8.0  HGB 12.6  HCT 38.7  PLT 271  MCV 95.1  MCH 31.0  MCHC 32.6  RDW 13.3  LYMPHSABS 1.0  MONOABS 0.7  EOSABS 0.3  BASOSABS 0.1   ------------------------------------------------------------------------------------------------------------------  Chemistries  Recent Labs  Lab 11/25/22 1037  NA 140  K 3.8  CL 105  CO2 28  GLUCOSE 114*  BUN 20  CREATININE 0.86  CALCIUM 8.9   ------------------------------------------------------------------------------------------------------------------ estimated creatinine clearance is 34 mL/min (by C-G formula based on SCr of 0.86 mg/dL). ------------------------------------------------------------------------------------------------------------------ --------------------------------------------------------------------------------------------------------------  Urinalysis    Component Value Date/Time   COLORURINE YELLOW 11/25/2022 1205   APPEARANCEUR CLOUDY (A) 11/25/2022 1205   APPEARANCEUR Clear 12/29/2021 1124   LABSPEC 1.018 11/25/2022 1205   PHURINE 8.0 11/25/2022 1205   GLUCOSEU NEGATIVE 11/25/2022 1205   HGBUR NEGATIVE 11/25/2022 1205   BILIRUBINUR NEGATIVE 11/25/2022 1205   BILIRUBINUR Negative 12/29/2021 1124   KETONESUR NEGATIVE 11/25/2022 1205   PROTEINUR 30 (A) 11/25/2022 1205   NITRITE NEGATIVE 11/25/2022 1205   LEUKOCYTESUR TRACE (A) 11/25/2022 1205     ----------------------------------------------------------------------------------------------------------------   Imaging Results:    DG Chest Portable 1 View  Result Date: 11/25/2022 CLINICAL DATA:  88 year old female  with syncope, hypotensive. EXAM: PORTABLE CHEST 1 VIEW COMPARISON:  Chest radiographs 03/03/2021. FINDINGS: Portable AP upright view at 1046 hours. Mildly rotated to the left. Extensive calcified aortic atherosclerosis. Cardiac size at the upper limits of normal. Other mediastinal contours are within normal limits. Visualized tracheal air column is within normal limits. Stable lung volumes. Mild eventration of the right hemidiaphragm, normal variant. Allowing for portable technique the lungs are clear. No acute osseous abnormality identified. Negative visible bowel gas. IMPRESSION: 1. No acute cardiopulmonary abnormality. 2. Aortic Atherosclerosis (ICD10-I70.0). Electronically Signed   By: Odessa Fleming M.D.   On: 11/25/2022 10:59    Radiological Exams on Admission: DG Chest Portable 1 View  Result Date: 11/25/2022 CLINICAL DATA:  88 year old female with syncope, hypotensive. EXAM: PORTABLE CHEST 1 VIEW COMPARISON:  Chest radiographs 03/03/2021. FINDINGS: Portable AP upright view at 1046 hours. Mildly rotated to the left. Extensive calcified aortic atherosclerosis. Cardiac size at the upper limits of normal. Other mediastinal contours are within normal limits. Visualized tracheal air column is within normal limits. Stable lung volumes. Mild eventration of the right hemidiaphragm, normal variant. Allowing for portable technique the lungs are clear. No acute osseous abnormality identified. Negative visible bowel gas. IMPRESSION: 1. No acute cardiopulmonary abnormality. 2. Aortic Atherosclerosis (ICD10-I70.0). Electronically Signed   By: Odessa Fleming M.D.   On: 11/25/2022 10:59    Condition  -stable  Shon Hale M.D on 11/25/2022 at 3:35 PM Go to [email or web address] -  for contact  info  [facility] - Office  [phone]

## 2022-11-25 NOTE — ED Notes (Signed)
Pt heart rate 67 while ambulating.

## 2022-11-25 NOTE — Discharge Instructions (Signed)
1) keep appointment with cardiologist Dr. Kem Boroughs in [place] on December 11, 2022----anticipate he will need an echocardiogram and a 30-day heart monitor device  2) Home health physical therapy and registered nurse has been requested for you  3) Seroquel has been reduced to 25 mg at bedtime only from 25 mg twice daily to avoid daytime sleepiness/drowsiness and falls or syncope  4)Your TSH thyroid test is still pending at the time of the discharge --your primary care physician can get the results of your thyroid test test and adjust your thyroid medication accordingly

## 2022-11-25 NOTE — ED Triage Notes (Signed)
Pt brought in by [facility] from [facility] [place] with c/o syncopal episode this morning while sitting in her chair. Facility reports BP of 69/49 to EMS, but EMS got BP as 109/68, HR 58, O2 sat 100% on RA. CBG 159 for EMS. Hx of Alzheimers.

## 2022-11-25 NOTE — ED Provider Notes (Signed)
[facility] EMERGENCY DEPARTMENT Provider Note   CSN: [id number] Arrival date & time: 11/25/22  3016     History  Chief Complaint  Patient presents with   Loss of Consciousness    April [name] is a 88 y.o. female with a history including hypertension, hypothyroidism, late onset Alzheimer's and hyperlipidemia presenting for evaluation of a syncopal event which occurred just prior to arrival.  She was sitting in a chair at her nursing facility prior to arrival when she slumped over with a syncopal event.  She does recall this happening but is unable to give me any details surrounding the event, such as if she had any symptoms prior or after this event.  She has no complaints at this time, she denies headache, chest pain, palpitations, nausea or vomiting, abdominal pain.  When the facility first checked her blood pressure was 69/49, by the time EMS arrived it was 109/68.  No recent history of illness to suggest dehydration.  Pt was discussed with Darra Lis, RN with pts facility, reports patient was not responsive for about 5 minutes with this syncopal event.  Confirms was sitting in a chair when the episode happened, bp 69/49,  rate 82.  She endorses this is the third episode since mid November.    The history is provided by the patient and the nursing home.       Home Medications Prior to Admission medications   Medication Sig Start Date End Date Taking? Authorizing Provider  amLODipine (NORVASC) 5 MG tablet Take 5 mg by mouth daily.    [provider]  aspirin 81 MG tablet Take 81 mg by mouth daily.    [provider]  donepezil (ARICEPT) 10 MG tablet Take 1 tablet (10 mg total) by mouth at bedtime. 01/09/22   Daphine Deutscher, [name], FNP  ibuprofen (ADVIL,MOTRIN) 600 MG tablet Take 1 tablet (600 mg total) by mouth 2 (two) times daily. Patient not taking: Reported on 07/02/2022 08/26/13   Bennie Pierini, FNP  levothyroxine (SYNTHROID) 75 MCG tablet Take 1  tablet (75 mcg total) by mouth daily before breakfast. 01/09/22   Daphine Deutscher, [name], FNP  lisinopril (ZESTRIL) 40 MG tablet Take 1 tablet (40 mg total) by mouth daily. 01/09/22   Daphine Deutscher, [name], FNP  Melatonin 3 MG TABS Take 3 mg by mouth at bedtime.    [provider]  memantine (NAMENDA XR) 21 MG CP24 24 hr capsule TAKE (1) CAPSULE DAILY 01/09/22   Daphine Deutscher, [name], FNP  QUEtiapine (SEROQUEL) 25 MG tablet Take 1 tablet (25 mg total) by mouth 2 (two) times daily. 02/28/22   Daphine Deutscher, [name], FNP  sertraline (ZOLOFT) 50 MG tablet Take 1 tablet (50 mg total) by mouth daily. 01/09/22   Daphine Deutscher [name], FNP      Allergies    Acid reducer [cimetidine] and Mevacor [lovastatin]    Review of Systems   Review of Systems  Unable to perform ROS: Dementia  Neurological:  Positive for syncope.    Physical Exam Updated Vital Signs BP (!) 141/64   Pulse (!) 48   Temp 97.7 F (36.5 C) (Oral)   Resp 12   Ht 5\' 5"  (1.651 m)   Wt 47.7 kg   SpO2 96%   BMI 17.49 kg/m  Physical Exam Vitals and nursing note reviewed.  Constitutional:      Appearance: She is well-developed.     Comments: Pleasantly confused.  Cooperative for exam.  HENT:     Head: Normocephalic and atraumatic.  Right Ear: Tympanic membrane normal.     Left Ear: Tympanic membrane normal.     Nose: No congestion.  Eyes:     General: No visual field deficit.    Conjunctiva/sclera: Conjunctivae normal.  Cardiovascular:     Rate and Rhythm: Normal rate and regular rhythm.     Heart sounds: Normal heart sounds.  Pulmonary:     Effort: Pulmonary effort is normal.     Breath sounds: Normal breath sounds. No wheezing.  Abdominal:     General: Bowel sounds are normal.     Palpations: Abdomen is soft.     Tenderness: There is no abdominal tenderness.  Musculoskeletal:        General: Normal range of motion.     Cervical back: Normal range of motion.  Skin:    General: Skin is warm and dry.   Neurological:     General: No focal deficit present.     Mental Status: She is alert.     Cranial Nerves: No cranial nerve deficit, dysarthria or facial asymmetry.     Motor: Motor function is intact.     Comments: Oriented to person, not place or time. Equal grip strength.  She cannot follow commands for pronator drift or heel/shin.      ED Results / Procedures / Treatments   Labs (all labs ordered are listed, but only abnormal results are displayed) Labs Reviewed  BASIC METABOLIC PANEL - Abnormal; Notable for the following components:      Result Value   Glucose, Bld 114 (*)    All other components within normal limits  URINALYSIS, ROUTINE W REFLEX MICROSCOPIC - Abnormal; Notable for the following components:   APPearance CLOUDY (*)    Protein, ur 30 (*)    Leukocytes,Ua TRACE (*)    Bacteria, UA RARE (*)    All other components within normal limits  CBC WITH DIFFERENTIAL/PLATELET  TSH  CBG MONITORING, ED  TROPONIN I (HIGH SENSITIVITY)  TROPONIN I (HIGH SENSITIVITY)    EKG EKG Interpretation  Date/Time:  Sunday November 25 2022 10:26:24 EST Ventricular Rate:  46 PR Interval:  293 QRS Duration: 75 QT Interval:  484 QTC Calculation: 424 R Axis:   56 Text Interpretation: Sinus bradycardia Prolonged PR interval Anteroseptal infarct, age indeterminate Confirmed by Noemi Chapel [phone]) on 11/25/2022 10:33:30 AM  Radiology DG Chest Portable 1 View  Result Date: 11/25/2022 CLINICAL DATA:  88 year old female with syncope, hypotensive. EXAM: PORTABLE CHEST 1 VIEW COMPARISON:  Chest radiographs 03/03/2021. FINDINGS: Portable AP upright view at 1046 hours. Mildly rotated to the left. Extensive calcified aortic atherosclerosis. Cardiac size at the upper limits of normal. Other mediastinal contours are within normal limits. Visualized tracheal air column is within normal limits. Stable lung volumes. Mild eventration of the right hemidiaphragm, normal variant. Allowing for  portable technique the lungs are clear. No acute osseous abnormality identified. Negative visible bowel gas. IMPRESSION: 1. No acute cardiopulmonary abnormality. 2. Aortic Atherosclerosis (ICD10-I70.0). Electronically Signed   By: Genevie Ann M.D.   On: 11/25/2022 10:59    Procedures Procedures    Medications Ordered in ED Medications  cefTRIAXone (ROCEPHIN) 1 g in sodium chloride 0.9 % 100 mL IVPB (has no administration in time range)    ED Course/ Medical Decision Making/ A&P                           Medical Decision Making Pt presenting with her third episode of  syncope, known bradycardia, but also with hypotension immediately after todays event.  Unclear etiology.  Scheduled to see a cardiologist in [place] on Jan 2.  Concerning for cardiac arrhythmia vs symptomatic bradycardia.  She does not appear to be dehydrated,  she is not orthostatic on exam here.      Amount and/or Complexity of Data Reviewed Independent Historian: caregiver    Details: Spoke with Burman Foster, RN at pt's facility.  Also discussed with pt's POA, son Kemely Antin.   Labs: ordered.    Details: Cbc, bmet, UA negative.  Radiology: ordered.    Details: No acute cardiovascular process.  ECG/medicine tests: ordered.    Details: Sinus brady , prolonged PR.  Discussion of management or test interpretation with external provider(s): Call placed to hospitalist service.  Discussed with Dr. Denton Brick who will consult pt, may or may not decide to admit pt, pending his consult.   Risk Decision regarding hospitalization.           Final Clinical Impression(s) / ED Diagnoses Final diagnoses:  Syncope, unspecified syncope type  Bradycardia  Hypotension, unspecified hypotension type    Rx / DC Orders ED Discharge Orders     None         Landis Martins 11/25/22 1415    Noemi Chapel, MD 12/03/22 [phone]

## 2022-11-25 NOTE — TOC Progression Note (Signed)
Transition of Care [facility]) - Progression Note    Patient Details  Name: April [name] MRN: [id number] Date of Birth: [birth year]  Transition of Care [facility] CM/SW Contact  Karn Cassis, [place] Phone Number: 11/25/2022, 3:49 PM  Clinical Narrative:   Per MD, pt will need HHPT/RN. Referred to [facility] with [facility]ish who accepts. Order in. Olegario Messier at [facility].          Expected Discharge Plan and Services           Expected Discharge Date: 11/25/22                                     Social Determinants of Health (SDOH) Interventions    Readmission Risk Interventions     No data to display

## 2022-11-26 LAB — URINE CULTURE

## 2022-11-27 DIAGNOSIS — I959 Hypotension, unspecified: Secondary | ICD-10-CM | POA: Diagnosis not present

## 2022-11-27 DIAGNOSIS — R55 Syncope and collapse: Secondary | ICD-10-CM | POA: Diagnosis not present

## 2022-11-27 DIAGNOSIS — R001 Bradycardia, unspecified: Secondary | ICD-10-CM | POA: Diagnosis not present

## 2022-11-29 ENCOUNTER — Telehealth: Payer: Self-pay

## 2022-11-29 NOTE — Telephone Encounter (Signed)
        Patient  visited [facility]  on 12/17     Telephone encounter attempt :  1st  A HIPAA compliant voice message was left requesting a return call.  Instructed patient to call back.    [name] [facility], [place], Care Management  [phone] [address], [place], [place] [address] Phone: [phone] Email: [email or web address]       

## 2022-12-04 ENCOUNTER — Telehealth: Payer: Self-pay

## 2022-12-04 NOTE — Telephone Encounter (Signed)
        Patient  visited [place] on 12/17     Telephone encounter attempt :  2nd  A HIPAA compliant voice message was left requesting a return call.  Instructed patient to call back .    Saesha Llerenas [facility], [place], Care Management  [phone] [address], [place], [place] [address] Phone: [phone] Email: [email or web address]       

## 2022-12-05 DIAGNOSIS — E559 Vitamin D deficiency, unspecified: Secondary | ICD-10-CM | POA: Diagnosis not present

## 2022-12-05 DIAGNOSIS — E039 Hypothyroidism, unspecified: Secondary | ICD-10-CM | POA: Diagnosis not present

## 2022-12-05 DIAGNOSIS — N189 Chronic kidney disease, unspecified: Secondary | ICD-10-CM | POA: Diagnosis not present

## 2022-12-05 DIAGNOSIS — I129 Hypertensive chronic kidney disease with stage 1 through stage 4 chronic kidney disease, or unspecified chronic kidney disease: Secondary | ICD-10-CM | POA: Diagnosis not present

## 2022-12-05 DIAGNOSIS — E038 Other specified hypothyroidism: Secondary | ICD-10-CM | POA: Diagnosis not present

## 2022-12-05 DIAGNOSIS — E119 Type 2 diabetes mellitus without complications: Secondary | ICD-10-CM | POA: Diagnosis not present

## 2022-12-05 DIAGNOSIS — G309 Alzheimer's disease, unspecified: Secondary | ICD-10-CM | POA: Diagnosis not present

## 2022-12-05 DIAGNOSIS — E782 Mixed hyperlipidemia: Secondary | ICD-10-CM | POA: Diagnosis not present

## 2022-12-05 DIAGNOSIS — Z79899 Other long term (current) drug therapy: Secondary | ICD-10-CM | POA: Diagnosis not present

## 2022-12-05 DIAGNOSIS — D518 Other vitamin B12 deficiency anemias: Secondary | ICD-10-CM | POA: Diagnosis not present

## 2022-12-05 DIAGNOSIS — I10 Essential (primary) hypertension: Secondary | ICD-10-CM | POA: Diagnosis not present

## 2022-12-06 DIAGNOSIS — I10 Essential (primary) hypertension: Secondary | ICD-10-CM | POA: Diagnosis not present

## 2022-12-11 ENCOUNTER — Other Ambulatory Visit: Payer: Self-pay | Admitting: Internal Medicine

## 2022-12-11 ENCOUNTER — Encounter: Payer: Self-pay | Admitting: Internal Medicine

## 2022-12-11 ENCOUNTER — Telehealth: Payer: Self-pay | Admitting: Internal Medicine

## 2022-12-11 ENCOUNTER — Ambulatory Visit: Payer: Medicare Other | Attending: Internal Medicine | Admitting: Internal Medicine

## 2022-12-11 ENCOUNTER — Other Ambulatory Visit (INDEPENDENT_AMBULATORY_CARE_PROVIDER_SITE_OTHER): Payer: Medicare Other

## 2022-12-11 VITALS — BP 169/67 | HR 53 | Ht 66.0 in | Wt 121.2 lb

## 2022-12-11 DIAGNOSIS — R55 Syncope and collapse: Secondary | ICD-10-CM

## 2022-12-11 DIAGNOSIS — E538 Deficiency of other specified B group vitamins: Secondary | ICD-10-CM | POA: Diagnosis not present

## 2022-12-11 DIAGNOSIS — E611 Iron deficiency: Secondary | ICD-10-CM | POA: Diagnosis not present

## 2022-12-11 NOTE — Progress Notes (Signed)
Cardiology Office Note  Date: 12/11/2022   ID: April [name], DOB 1931-12-25, MRN [id number]  PCP:  April Nasuti, MD  Cardiologist:  April Guest, MD Electrophysiologist:  None   Reason for Office Visit: Evaluation of syncope at the request of Dr. London [name]   History of Present Illness: April [name] is a 89 y.o. female known to have Alzheimer's dementia, HTN, hypothyroidism was referred to cardiology clinic for evaluation of syncope. Accompanied by caregiver at the facility.  History is obtained from the ER documentation and limited history from the patient.  Per ER documentation from 11/25/2022, patient was brought to the ER after a syncopal episode in the past today. "Patient was sitting in a chair at her nursing facility prior to arrival when she slumped over with a syncopal event and patient did not recall this happening but was unable to give any details". Orthostatic vitals were negative in the ER.  TSH is 0.4. Troponins were within normal limits. EKG showed sinus bradycardia with first degree AV block. Antihypertensive medication doses were decreased and she was sent home. No recurrent presyncope/syncopal episodes upon discharge from the ER.  Patient is here for new patient visit. She denied any history of presyncope/syncope ever in her life. Per caregiver, patient is active (can walk with no restrictions and climbs stairs independently). No prior history of ischemic evaluation.  No prior history of MI/PCI/CABG.  Patient's son is the next of kin and makes decision for the patient.  Past Medical History:  Diagnosis Date   Alzheimer disease ([place])    Arthritis    Cognitive changes    Hyperlipidemia    Hypertension    Thyroid disease     Past Surgical History:  Procedure Laterality Date   FOOT SURGERY     Arthritis    HAND SURGERY     Arthritis    thumb surgery Left    [facility]    VAGINAL HYSTERECTOMY      Current Outpatient Medications  Medication Sig  Dispense Refill   amLODipine (NORVASC) 5 MG tablet Take 5 mg by mouth daily.     aspirin 81 MG tablet Take 1 tablet (81 mg total) by mouth daily with breakfast. 30 tablet 3   atorvastatin (LIPITOR) 20 MG tablet Take 20 mg by mouth daily.     donepezil (ARICEPT) 10 MG tablet Take 1 tablet (10 mg total) by mouth at bedtime. 90 tablet 1   levothyroxine (SYNTHROID) 75 MCG tablet Take 1 tablet (75 mcg total) by mouth daily before breakfast. 90 tablet 1   lisinopril (ZESTRIL) 20 MG tablet Take 1 tablet (20 mg total) by mouth daily. 30 tablet 2   melatonin 3 MG TABS tablet Take 3 mg by mouth at bedtime.     memantine (NAMENDA XR) 21 MG CP24 24 hr capsule TAKE (1) CAPSULE DAILY 90 capsule 1   QUEtiapine (SEROQUEL) 25 MG tablet Take 1 tablet (25 mg total) by mouth at bedtime. 30 tablet 2   sertraline (ZOLOFT) 50 MG tablet Take 1 tablet (50 mg total) by mouth daily. 90 tablet 1   No current facility-administered medications for this visit.   Allergies:  Acid reducer [cimetidine] and Mevacor [lovastatin]   Social History: The patient  reports that she has quit smoking. She has never used smokeless tobacco. She reports that she does not currently use alcohol after a past usage of about 1.0 standard drink of alcohol per week. She reports that she does not  use drugs.   Family History: The patient's family history includes Alcohol abuse in her father; Dementia in her sister; Pneumonia in her father.   ROS:  Please see the history of present illness. Otherwise, complete review of systems is positive for none.  All other systems are reviewed and negative.   Physical Exam: VS:  BP (!) 169/67 (BP Location: Right Arm, Cuff Size: Normal)   Pulse (!) 53   Ht 5\' 6"  (1.676 m)   Wt 121 lb 3.2 oz (55 kg)   SpO2 97%   BMI 19.56 kg/m , BMI Body mass index is 19.56 kg/m.  Wt Readings from Last 3 Encounters:  12/11/22 121 lb 3.2 oz (55 kg)  11/25/22 105 lb 1.6 oz (47.7 kg)  11/02/22 105 lb 1.6 oz (47.7 kg)     General: Patient appears comfortable at rest. HEENT: Conjunctiva and lids normal, oropharynx clear with moist mucosa. Neck: Supple, no elevated JVP or carotid bruits, no thyromegaly. Lungs: Clear to auscultation, nonlabored breathing at rest. Cardiac: Regular rate and rhythm, no S3 or significant systolic murmur, no pericardial rub. Abdomen: Soft, nontender, no hepatomegaly, bowel sounds present, no guarding or rebound. Extremities: No pitting edema, distal pulses 2+. Skin: Warm and dry. Musculoskeletal: No kyphosis. Neuropsychiatric: Alert and oriented to self  ECG: Sinus bradycardia with first-degree AV block  Recent Labwork: 01/09/2022: ALT 12; AST 21 11/25/2022: BUN 20; Creatinine, Ser 0.86; Hemoglobin 12.6; Platelets 271; Potassium 3.8; Sodium 140; TSH 0.477     Component Value Date/Time   CHOL 273 (H) 01/09/2022 1512   TRIG 244 (H) 01/09/2022 1512   TRIG 241 (H) 08/19/2014 0811   HDL 55 01/09/2022 1512   HDL 41 08/19/2014 0811   CHOLHDL 5.0 (H) 01/09/2022 1512   LDLCALC 172 (H) 01/09/2022 1512   LDLCALC 201 (H) 08/19/2014 [id number]    Other Studies Reviewed Today: I personally reviewed the records provided to me by the caregiver  Assessment and Plan: Patient is 89 year old F known to have Alzheimer's dementia, HTN was referred to cardiology clinic for evaluation of syncope.  # History of questionable syncope -Patient currently not on any AV nodal agents however donepezil could cause bradycardia but HR 40 to 50s should not cause any symptoms. Patient also has chronotropic competence according to the caregiver that patient can walk and climb stairs with no symptoms which the patient acknowledged. EKG showed sinus bradycardia with first degree AV block. First-degree AV block is not an indication for permanent pacemaker implantation although marked PR prolongation > 300 msec correlating with symptoms of dizziness/presyncope/syncope might qualify for PPM implantation.  Although her  EKG did show PR prolongation 303 ms, it is difficult at this point to discern if she has any symptoms of dizziness (due to moderate Alzheimer's dementia). Obtain 2-week live event monitor to rule out high-grade conduction abnormalities and 2D echocardiogram to rule out structural heart disease. -No recurrent episodes of presyncope/syncope after the antihypertensive medication doses were reduced on 11/25/22 ER visit. Instructed caregiver to obtain twice a day blood pressures (a.m. and p.m.) and bring the log of blood pressure readings in the next clinic visit.  # HTN, partially controlled -Continue amlodipine 5 mg once daily -Continue lisinopril 20 mg once daily -Instructed caregiver to obtain twice a day blood pressures (a.m. and p.m.) and bring the log of blood pressure readings in the next clinic visit.  # HLD -Continue atorvastatin 20 mg nightly -Management of HLD per PCP  I have spent a total of 41 minutes  with patient reviewing chart , telemetry, EKGs, labs and examining patient as well as establishing an assessment and plan that was discussed with the patient.  > 50% of time was spent in direct patient care.    Medication Adjustments/Labs and Tests Ordered: Current medicines are reviewed at length with the patient today.  Concerns regarding medicines are outlined above.   Tests Ordered: Orders Placed This Encounter  Procedures   ECHOCARDIOGRAM COMPLETE    Medication Changes: No orders of the defined types were placed in this encounter.   Disposition:  Follow up  2 months  Signed Jeraline Marcinek Fidel Levy, MD, 12/11/2022 10:22 AM    [facility] [place], [place], [place] [address]

## 2022-12-11 NOTE — Telephone Encounter (Signed)
14 Day ZIO AT dx: syncope  (PERCERT)

## 2022-12-11 NOTE — Patient Instructions (Addendum)
Medication Instructions:  Your physician recommends that you continue on your current medications as directed. Please refer to the Current Medication list given to you today.  Labwork: none  Testing/Procedures: Your physician has requested that you have an echocardiogram. Echocardiography is a painless test that uses sound waves to create images of your heart. It provides your doctor with information about the size and shape of your heart and how well your heart's chambers and valves are working. This procedure takes approximately one hour. There are no restrictions for this procedure. Please do NOT wear cologne, perfume, aftershave, or lotions (deodorant is allowed). Please arrive 15 minutes prior to your appointment time. Your physician has recommended that you wear a Zio monitor.   This monitor is a medical device that records the heart's electrical activity. Doctors most often use these monitors to diagnose arrhythmias. Arrhythmias are problems with the speed or rhythm of the heartbeat. The monitor is a small device applied to your chest. You can wear one while you do your normal daily activities. While wearing this monitor if you have any symptoms to push the button and record what you felt. Once you have worn this monitor for the period of time provider prescribed (for 14 days), you will return the monitor device in the postage paid box. Once it is returned they will download the data collected and provide [place] with a report which the provider will then review and we will call you with those results. Important tips:  Avoid showering during the first 24 hours of wearing the monitor. Avoid excessive sweating to help maximize wear time. Do not submerge the device, no hot tubs, and no swimming pools. Keep any lotions or oils away from the patch. After 24 hours you may shower with the patch on. Take brief showers with your back facing the shower head.  Do not remove patch once it has been placed  because that will interrupt data and decrease adhesive wear time. Push the button when you have any symptoms and write down what you were feeling. Once you have completed wearing your monitor, remove and place into box which has postage paid and place in your outgoing mailbox.  If for some reason you have misplaced your box then call our office and we can provide another box and/or mail it off for you.  Follow-Up: Your physician recommends that you schedule a follow-up appointment in: 2 months  Any Other Special Instructions Will Be Listed Below (If Applicable).  If you need a refill on your cardiac medications before your next appointment, please call your pharmacy.

## 2022-12-12 ENCOUNTER — Telehealth: Payer: Self-pay | Admitting: Internal Medicine

## 2022-12-12 DIAGNOSIS — R55 Syncope and collapse: Secondary | ICD-10-CM | POA: Diagnosis not present

## 2022-12-12 NOTE — Telephone Encounter (Signed)
April [name] from [facility] requesting order for them to check the patients vitals if she is lightheaded or dizzy. She says it also needs to tell them what they would need to do after checking vitals. Phone: [phone] Fax: [phone]

## 2022-12-12 NOTE — Telephone Encounter (Signed)
Left a message for April [name] to give office a call back.

## 2022-12-13 NOTE — Telephone Encounter (Signed)
Spoke to staff at [facility] and relayed orders from Dr. Dellia Cloud. Faxed a copy of orders to facility per staff request.

## 2022-12-31 DIAGNOSIS — E782 Mixed hyperlipidemia: Secondary | ICD-10-CM | POA: Diagnosis not present

## 2022-12-31 DIAGNOSIS — I10 Essential (primary) hypertension: Secondary | ICD-10-CM | POA: Diagnosis not present

## 2022-12-31 DIAGNOSIS — N189 Chronic kidney disease, unspecified: Secondary | ICD-10-CM | POA: Diagnosis not present

## 2022-12-31 DIAGNOSIS — E039 Hypothyroidism, unspecified: Secondary | ICD-10-CM | POA: Diagnosis not present

## 2022-12-31 DIAGNOSIS — E038 Other specified hypothyroidism: Secondary | ICD-10-CM | POA: Diagnosis not present

## 2022-12-31 DIAGNOSIS — D518 Other vitamin B12 deficiency anemias: Secondary | ICD-10-CM | POA: Diagnosis not present

## 2022-12-31 DIAGNOSIS — E119 Type 2 diabetes mellitus without complications: Secondary | ICD-10-CM | POA: Diagnosis not present

## 2022-12-31 DIAGNOSIS — E559 Vitamin D deficiency, unspecified: Secondary | ICD-10-CM | POA: Diagnosis not present

## 2022-12-31 DIAGNOSIS — G309 Alzheimer's disease, unspecified: Secondary | ICD-10-CM | POA: Diagnosis not present

## 2022-12-31 DIAGNOSIS — I129 Hypertensive chronic kidney disease with stage 1 through stage 4 chronic kidney disease, or unspecified chronic kidney disease: Secondary | ICD-10-CM | POA: Diagnosis not present

## 2023-01-01 ENCOUNTER — Ambulatory Visit: Payer: Medicare Other | Attending: Internal Medicine

## 2023-01-01 DIAGNOSIS — R55 Syncope and collapse: Secondary | ICD-10-CM | POA: Diagnosis not present

## 2023-01-01 LAB — ECHOCARDIOGRAM COMPLETE
AR max vel: 2.17 cm2
AV Peak grad: 8.2 mmHg
Ao pk vel: 1.43 m/s
Area-P 1/2: 1.89 cm2
Calc EF: 70 %
MV M vel: 4.51 m/s
MV Peak grad: 81.2 mmHg
S' Lateral: 2.5 cm
Single Plane A2C EF: 69.6 %
Single Plane A4C EF: 75 %

## 2023-01-04 ENCOUNTER — Encounter: Payer: Self-pay | Admitting: *Deleted

## 2023-01-06 DIAGNOSIS — I10 Essential (primary) hypertension: Secondary | ICD-10-CM | POA: Diagnosis not present

## 2023-01-08 DIAGNOSIS — I10 Essential (primary) hypertension: Secondary | ICD-10-CM | POA: Diagnosis not present

## 2023-01-08 DIAGNOSIS — N189 Chronic kidney disease, unspecified: Secondary | ICD-10-CM | POA: Diagnosis not present

## 2023-01-08 DIAGNOSIS — G4700 Insomnia, unspecified: Secondary | ICD-10-CM | POA: Diagnosis not present

## 2023-01-08 DIAGNOSIS — E782 Mixed hyperlipidemia: Secondary | ICD-10-CM | POA: Diagnosis not present

## 2023-01-08 DIAGNOSIS — G629 Polyneuropathy, unspecified: Secondary | ICD-10-CM | POA: Diagnosis not present

## 2023-01-08 DIAGNOSIS — E039 Hypothyroidism, unspecified: Secondary | ICD-10-CM | POA: Diagnosis not present

## 2023-01-08 DIAGNOSIS — G309 Alzheimer's disease, unspecified: Secondary | ICD-10-CM | POA: Diagnosis not present

## 2023-01-15 ENCOUNTER — Encounter (HOSPITAL_COMMUNITY): Payer: Self-pay | Admitting: Emergency Medicine

## 2023-01-15 ENCOUNTER — Other Ambulatory Visit: Payer: Self-pay

## 2023-01-15 ENCOUNTER — Emergency Department (HOSPITAL_COMMUNITY)
Admission: EM | Admit: 2023-01-15 | Discharge: 2023-01-16 | Disposition: A | Discharge status: Skilled Nursing Facility | Payer: Medicare Other | Attending: Emergency Medicine | Admitting: Emergency Medicine

## 2023-01-15 ENCOUNTER — Emergency Department (HOSPITAL_COMMUNITY): Payer: Medicare Other

## 2023-01-15 DIAGNOSIS — Z7982 Long term (current) use of aspirin: Secondary | ICD-10-CM | POA: Diagnosis not present

## 2023-01-15 DIAGNOSIS — R6889 Other general symptoms and signs: Secondary | ICD-10-CM | POA: Diagnosis not present

## 2023-01-15 DIAGNOSIS — R410 Disorientation, unspecified: Secondary | ICD-10-CM | POA: Diagnosis not present

## 2023-01-15 DIAGNOSIS — R55 Syncope and collapse: Secondary | ICD-10-CM

## 2023-01-15 DIAGNOSIS — Z743 Need for continuous supervision: Secondary | ICD-10-CM | POA: Diagnosis not present

## 2023-01-15 DIAGNOSIS — R001 Bradycardia, unspecified: Secondary | ICD-10-CM | POA: Insufficient documentation

## 2023-01-15 DIAGNOSIS — R42 Dizziness and giddiness: Secondary | ICD-10-CM | POA: Diagnosis not present

## 2023-01-15 DIAGNOSIS — Z79899 Other long term (current) drug therapy: Secondary | ICD-10-CM | POA: Insufficient documentation

## 2023-01-15 DIAGNOSIS — R404 Transient alteration of awareness: Secondary | ICD-10-CM | POA: Diagnosis not present

## 2023-01-15 DIAGNOSIS — R531 Weakness: Secondary | ICD-10-CM | POA: Diagnosis not present

## 2023-01-15 DIAGNOSIS — W19XXXA Unspecified fall, initial encounter: Secondary | ICD-10-CM | POA: Diagnosis not present

## 2023-01-15 LAB — CBC WITH DIFFERENTIAL/PLATELET
Abs Immature Granulocytes: 0.02 10*3/uL (ref 0.00–0.07)
Basophils Absolute: 0 10*3/uL (ref 0.0–0.1)
Basophils Relative: 1 %
Eosinophils Absolute: 0.1 10*3/uL (ref 0.0–0.5)
Eosinophils Relative: 2 %
HCT: 41.3 % (ref 36.0–46.0)
Hemoglobin: 13.3 g/dL (ref 12.0–15.0)
Immature Granulocytes: 0 %
Lymphocytes Relative: 21 %
Lymphs Abs: 1.1 10*3/uL (ref 0.7–4.0)
MCH: 30.6 pg (ref 26.0–34.0)
MCHC: 32.2 g/dL (ref 30.0–36.0)
MCV: 95.2 fL (ref 80.0–100.0)
Monocytes Absolute: 0.5 10*3/uL (ref 0.1–1.0)
Monocytes Relative: 10 %
Neutro Abs: 3.5 10*3/uL (ref 1.7–7.7)
Neutrophils Relative %: 66 %
Platelets: 304 10*3/uL (ref 150–400)
RBC: 4.34 MIL/uL (ref 3.87–5.11)
RDW: 13.8 % (ref 11.5–15.5)
WBC: 5.3 10*3/uL (ref 4.0–10.5)
nRBC: 0 % (ref 0.0–0.2)

## 2023-01-15 LAB — URINALYSIS, ROUTINE W REFLEX MICROSCOPIC
Bacteria, UA: NONE SEEN
Bilirubin Urine: NEGATIVE
Glucose, UA: NEGATIVE mg/dL
Hgb urine dipstick: NEGATIVE
Ketones, ur: 5 mg/dL — AB
Nitrite: NEGATIVE
Protein, ur: NEGATIVE mg/dL
Specific Gravity, Urine: 1.017 (ref 1.005–1.030)
pH: 6 (ref 5.0–8.0)

## 2023-01-15 LAB — COMPREHENSIVE METABOLIC PANEL
ALT: 15 U/L (ref 0–44)
AST: 29 U/L (ref 15–41)
Albumin: 4 g/dL (ref 3.5–5.0)
Alkaline Phosphatase: 81 U/L (ref 38–126)
Anion gap: 10 (ref 5–15)
BUN: 21 mg/dL (ref 8–23)
CO2: 29 mmol/L (ref 22–32)
Calcium: 9.1 mg/dL (ref 8.9–10.3)
Chloride: 102 mmol/L (ref 98–111)
Creatinine, Ser: 0.87 mg/dL (ref 0.44–1.00)
GFR, Estimated: >60 mL/min (ref >60)
Glucose, Bld: 81 mg/dL (ref 70–99)
Potassium: 3.5 mmol/L (ref 3.5–5.1)
Sodium: 141 mmol/L (ref 135–145)
Total Bilirubin: 0.7 mg/dL (ref 0.3–1.2)
Total Protein: 7.3 g/dL (ref 6.5–8.1)

## 2023-01-15 LAB — TROPONIN I (HIGH SENSITIVITY)
Troponin I (High Sensitivity): 5 ng/L (ref <18)
Troponin I (High Sensitivity): 4 ng/L (ref <18)

## 2023-01-15 NOTE — ED Notes (Signed)
Notified [facility] of patient needing transportation back to [facility].

## 2023-01-15 NOTE — ED Notes (Signed)
Attempted to give report to SNF but no answer x 2 .

## 2023-01-15 NOTE — ED Notes (Signed)
Pt given orange juice upon arrival

## 2023-01-15 NOTE — ED Triage Notes (Signed)
Facility reported pt had a unwitnessed fall. Pt reported she was dizzy and lightheaded. Pt reported she was not hurting, did not hit her head. She does take aspirin daily. Facility reported they got a bp od 71/49 which required them to call EMS. EMS found all VS WNL on their arrival. Pt is A&Ox 3

## 2023-01-15 NOTE — Discharge Instructions (Addendum)
Follow up with your Physician.  They may need to decrease your blood pressure medication

## 2023-01-16 DIAGNOSIS — Z7401 Bed confinement status: Secondary | ICD-10-CM | POA: Diagnosis not present

## 2023-01-16 DIAGNOSIS — R6889 Other general symptoms and signs: Secondary | ICD-10-CM | POA: Diagnosis not present

## 2023-01-16 DIAGNOSIS — Z743 Need for continuous supervision: Secondary | ICD-10-CM | POA: Diagnosis not present

## 2023-01-16 NOTE — ED Provider Notes (Signed)
[facility] Provider Note   CSN: [id number] Arrival date & time: 01/15/23  [id number]     History  Chief Complaint  Patient presents with   Fall   Dizziness    April [name] is a 89 y.o. female.  Pt is reported to have had a fall today.  Staff at facility report pt did not have any injuries.  She did not hit her head.  They took pt's blood pressure after fall and were concerned that blood pressure is low  The history is provided by the patient and the nursing home. No language interpreter was used.  Fall This is a new problem. The problem occurs constantly. The problem has not changed since onset.Pertinent negatives include no chest pain. Nothing aggravates the symptoms. Nothing relieves the symptoms. She has tried nothing for the symptoms. The treatment provided no relief.  Dizziness Associated symptoms: no chest pain        Home Medications Prior to Admission medications   Medication Sig Start Date End Date Taking? Authorizing Provider  amLODipine (NORVASC) 5 MG tablet Take 5 mg by mouth daily.   Yes [provider]  aspirin 81 MG tablet Take 1 tablet (81 mg total) by mouth daily with breakfast. 11/25/22  Yes Emokpae, Courage, MD  atorvastatin (LIPITOR) 20 MG tablet Take 20 mg by mouth daily. 11/20/22  Yes [provider]  donepezil (ARICEPT) 10 MG tablet Take 1 tablet (10 mg total) by mouth at bedtime. 01/09/22  Yes Martin, [name], FNP  Ferrous Sulfate (IRON) 325 (65 Fe) MG TABS Take 1 tablet by mouth daily. 12/11/22  Yes [provider]  folic acid (FOLVITE) 1 MG tablet Take 1 mg by mouth daily. 01/15/23  Yes [provider]  levothyroxine (SYNTHROID) 75 MCG tablet Take 1 tablet (75 mcg total) by mouth daily before breakfast. 01/09/22  Yes Hassell Done, [name], FNP  lisinopril (ZESTRIL) 20 MG tablet Take 1 tablet (20 mg total) by mouth daily. 11/25/22  Yes Emokpae, Courage, MD  melatonin 3 MG TABS  tablet Take 3 mg by mouth at bedtime.   Yes [provider]  memantine (NAMENDA XR) 21 MG CP24 24 hr capsule TAKE (1) CAPSULE DAILY 01/09/22  Yes Hassell Done, [name], FNP  QUEtiapine (SEROQUEL) 25 MG tablet Take 1 tablet (25 mg total) by mouth at bedtime. Patient taking differently: Take 25 mg by mouth 2 (two) times daily. 11/25/22  Yes Emokpae, Courage, MD  sertraline (ZOLOFT) 50 MG tablet Take 1 tablet (50 mg total) by mouth daily. 01/09/22  Yes Hassell Done, [name], FNP      Allergies    Acid reducer [cimetidine] and Mevacor [lovastatin]    Review of Systems   Review of Systems  Cardiovascular:  Negative for chest pain.  Neurological:  Positive for dizziness.  All other systems reviewed and are negative.   Physical Exam Updated Vital Signs BP 132/69   Pulse (!) 56   Temp 97.8 F (36.6 C)   Resp 14   Ht 5' 6"$  (1.676 m)   Wt 55 kg   SpO2 97%   BMI 19.57 kg/m  Physical Exam Vitals and nursing note reviewed.  Constitutional:      Appearance: She is well-developed.  HENT:     Head: Normocephalic.  Cardiovascular:     Rate and Rhythm: Normal rate.  Pulmonary:     Effort: Pulmonary effort is normal.  Abdominal:     General: There is no distension.  Musculoskeletal:  General: Normal range of motion.     Cervical back: Normal range of motion.  Skin:    General: Skin is warm.  Neurological:     General: No focal deficit present.     Mental Status: She is alert.     Comments: Confused,   Psychiatric:        Mood and Affect: Mood normal.     ED Results / Procedures / Treatments   Labs (all labs ordered are listed, but only abnormal results are displayed) Labs Reviewed  URINALYSIS, ROUTINE W REFLEX MICROSCOPIC - Abnormal; Notable for the following components:      Result Value   Ketones, ur 5 (*)    Leukocytes,Ua TRACE (*)    All other components within normal limits  CBC WITH DIFFERENTIAL/PLATELET  COMPREHENSIVE METABOLIC PANEL  TROPONIN I  (HIGH SENSITIVITY)  TROPONIN I (HIGH SENSITIVITY)    EKG EKG Interpretation  Date/Time:  Tuesday January 15 2023 10:20:43 EST Ventricular Rate:  55 PR Interval:    QRS Duration: 86 QT Interval:  426 QTC Calculation: 408 R Axis:   34 Text Interpretation: Sinus bradycardia Low voltage, precordial leads Confirmed by Sherwood Gambler [phone]) on 01/16/2023 7:42:05 AM  Radiology DG Chest Port 1 View  Result Date: 01/15/2023 CLINICAL DATA:  weakness EXAM: PORTABLE CHEST - 1 VIEW COMPARISON:  11/25/2022 FINDINGS: Cardiac silhouette is unremarkable. No pneumothorax or pleural effusion. The lungs are clear. Aorta is calcified. The visualized skeletal structures are grossly intact. IMPRESSION: No acute cardiopulmonary process. Electronically Signed   By: Sammie Bench M.D.   On: 01/15/2023 11:19    Procedures Procedures    Medications Ordered in ED Medications - No data to display  ED Course/ Medical Decision Making/ A&P                             Medical Decision Making Pt is reported to have fallen out of bed   Amount and/or Complexity of Data Reviewed Independent Historian: EMS    Details: Nursing home staff Labs: ordered.    Details: Labs ordered reviewed and  interpreted.  Radiology: ordered and independent interpretation performed. Decision-making details documented in ED Course.    Details: Chest xray  no acute abnormality ECG/medicine tests: ordered and independent interpretation performed. Decision-making details documented in ED Course.    Details: EKg shows no acute change.    Risk Risk Details: Pt's blood pressure is stable.  No uti.  No acute lab abnormality.  I think pt is safe to return to facility            Final Clinical Impression(s) / ED Diagnoses Final diagnoses:  Near syncope    Rx / DC Orders ED Discharge Orders     None     An After Visit Summary was printed and given to the patient.     Fransico Meadow, PA-C 01/16/23 Nichols, Ankit, MD 01/20/23 [phone]

## 2023-01-24 DIAGNOSIS — E119 Type 2 diabetes mellitus without complications: Secondary | ICD-10-CM | POA: Diagnosis not present

## 2023-01-24 DIAGNOSIS — E038 Other specified hypothyroidism: Secondary | ICD-10-CM | POA: Diagnosis not present

## 2023-01-24 DIAGNOSIS — I129 Hypertensive chronic kidney disease with stage 1 through stage 4 chronic kidney disease, or unspecified chronic kidney disease: Secondary | ICD-10-CM | POA: Diagnosis not present

## 2023-01-24 DIAGNOSIS — E039 Hypothyroidism, unspecified: Secondary | ICD-10-CM | POA: Diagnosis not present

## 2023-01-24 DIAGNOSIS — D518 Other vitamin B12 deficiency anemias: Secondary | ICD-10-CM | POA: Diagnosis not present

## 2023-01-24 DIAGNOSIS — E559 Vitamin D deficiency, unspecified: Secondary | ICD-10-CM | POA: Diagnosis not present

## 2023-01-24 DIAGNOSIS — N189 Chronic kidney disease, unspecified: Secondary | ICD-10-CM | POA: Diagnosis not present

## 2023-01-24 DIAGNOSIS — E782 Mixed hyperlipidemia: Secondary | ICD-10-CM | POA: Diagnosis not present

## 2023-01-24 DIAGNOSIS — I10 Essential (primary) hypertension: Secondary | ICD-10-CM | POA: Diagnosis not present

## 2023-01-24 DIAGNOSIS — G309 Alzheimer's disease, unspecified: Secondary | ICD-10-CM | POA: Diagnosis not present

## 2023-01-25 ENCOUNTER — Telehealth: Payer: Self-pay | Admitting: *Deleted

## 2023-01-25 NOTE — Telephone Encounter (Signed)
        Patient  visited [facility] ed on 01/16/2023  for treatment    Telephone encounter attempt :  [place] [phone] [address] , [place] [address] Email : [email or web address]

## 2023-01-29 DIAGNOSIS — R296 Repeated falls: Secondary | ICD-10-CM | POA: Diagnosis not present

## 2023-01-30 ENCOUNTER — Telehealth: Payer: Self-pay | Admitting: *Deleted

## 2023-01-30 NOTE — Telephone Encounter (Signed)
     Patient  visit on 01/16/2023  at [facility] was for treatment   [facility]   [place] [phone] [address] , [place] [address] Email : [email or web address]

## 2023-02-06 DIAGNOSIS — L600 Ingrowing nail: Secondary | ICD-10-CM | POA: Diagnosis not present

## 2023-02-06 DIAGNOSIS — M79672 Pain in left foot: Secondary | ICD-10-CM | POA: Diagnosis not present

## 2023-02-06 DIAGNOSIS — M79671 Pain in right foot: Secondary | ICD-10-CM | POA: Diagnosis not present

## 2023-02-06 DIAGNOSIS — B351 Tinea unguium: Secondary | ICD-10-CM | POA: Diagnosis not present

## 2023-02-07 DIAGNOSIS — I10 Essential (primary) hypertension: Secondary | ICD-10-CM | POA: Diagnosis not present

## 2023-02-12 ENCOUNTER — Encounter: Payer: Self-pay | Admitting: Internal Medicine

## 2023-02-12 ENCOUNTER — Ambulatory Visit: Payer: Medicare Other | Attending: Internal Medicine | Admitting: Internal Medicine

## 2023-02-12 VITALS — BP 116/50 | HR 48 | Ht 66.0 in | Wt 126.2 lb

## 2023-02-12 DIAGNOSIS — I361 Nonrheumatic tricuspid (valve) insufficiency: Secondary | ICD-10-CM | POA: Diagnosis not present

## 2023-02-12 DIAGNOSIS — I340 Nonrheumatic mitral (valve) insufficiency: Secondary | ICD-10-CM | POA: Diagnosis not present

## 2023-02-12 DIAGNOSIS — I071 Rheumatic tricuspid insufficiency: Secondary | ICD-10-CM | POA: Insufficient documentation

## 2023-02-12 NOTE — Patient Instructions (Addendum)
Medication Instructions:  Your physician recommends that you continue on your current medications as directed. Please refer to the Current Medication list given to you today.  Labwork: none  Testing/Procedures: Your physician has requested that you have an echocardiogram in 2 years before next visit (2026). Echocardiography is a painless test that uses sound waves to create images of your heart. It provides your doctor with information about the size and shape of your heart and how well your heart's chambers and valves are working. This procedure takes approximately one hour. There are no restrictions for this procedure. Please do NOT wear cologne, perfume, aftershave, or lotions (deodorant is allowed). Please arrive 15 minutes prior to your appointment time.  Follow-Up: Your physician recommends that you schedule a follow-up appointment in: 2 year. You will receive a reminder call in the mail in about 20 months reminding you to call and schedule your appointment. If you don't receive this call, please contact our office.  Any Other Special Instructions Will Be Listed Below (If Applicable).  If you need a refill on your cardiac medications before your next appointment, please call your pharmacy.

## 2023-02-12 NOTE — Progress Notes (Signed)
Cardiology Office Note  Date: 02/12/2023   ID: April [name], DOB December 13, 1931, MRN [id number]  PCP:  Bonnita Nasuti, MD  Cardiologist:  Chalmers Guest, MD Electrophysiologist:  None   Reason for Office Visit: Follow-up visit of syncope.   History of Present Illness: April [name] is a 89 y.o. female known to have Alzheimer's dementia, HTN, hypothyroidism presented to cardiology clinic for evaluation of syncope.  Patient was initially referred to cardiology clinic for evaluation of questionable syncope. Patient was brought to ER on 11/25/2022 for questionable syncope, negative orthostatic vitals, labs unremarkable, TSH 0.4 and troponins were within normal limits. EKG showed sinus bradycardia with first-degree AV block. Antihypertensive medication doses were decreased after which she did not have any recurrent syncopal episodes.  She underwent echocardiogram which showed normal LVEF, mild MR and mild to moderate TR. Event monitor showed normal sinus rhythm, first-degree AV block and 42 brief episodes of SVT. Patient presented today for follow-up visit, accompanied by caretaker.  Difficult to elicit any history from the patient due to dementia.  Although she did complain of dizziness when she moves around quickly. Otherwise denies any chest pain, DOE, syncope, palpitations and leg swelling. Per caregiver, patient is active (can walk with no restrictions and climbs stairs independently). No prior history of ischemic evaluation.  No prior history of MI/PCI/CABG.  Patient's son is the next of kin and makes decision for the patient.  Past Medical History:  Diagnosis Date   Alzheimer disease ([place])    Arthritis    Cognitive changes    Hyperlipidemia    Hypertension    Thyroid disease     Past Surgical History:  Procedure Laterality Date   FOOT SURGERY     Arthritis    HAND SURGERY     Arthritis    thumb surgery Left    [facility]    VAGINAL HYSTERECTOMY      Current  Outpatient Medications  Medication Sig Dispense Refill   amLODipine (NORVASC) 5 MG tablet Take 5 mg by mouth daily.     aspirin 81 MG tablet Take 1 tablet (81 mg total) by mouth daily with breakfast. 30 tablet 3   atorvastatin (LIPITOR) 20 MG tablet Take 20 mg by mouth daily.     donepezil (ARICEPT) 10 MG tablet Take 1 tablet (10 mg total) by mouth at bedtime. 90 tablet 1   Ferrous Sulfate (IRON) 325 (65 Fe) MG TABS Take 1 tablet by mouth daily.     folic acid (FOLVITE) 1 MG tablet Take 1 mg by mouth daily.     levothyroxine (SYNTHROID) 75 MCG tablet Take 1 tablet (75 mcg total) by mouth daily before breakfast. 90 tablet 1   lisinopril (ZESTRIL) 20 MG tablet Take 1 tablet (20 mg total) by mouth daily. 30 tablet 2   melatonin 3 MG TABS tablet Take 3 mg by mouth at bedtime.     memantine (NAMENDA XR) 21 MG CP24 24 hr capsule TAKE (1) CAPSULE DAILY 90 capsule 1   QUEtiapine (SEROQUEL) 25 MG tablet Take 1 tablet (25 mg total) by mouth at bedtime. (Patient taking differently: Take 25 mg by mouth 2 (two) times daily.) 30 tablet 2   sertraline (ZOLOFT) 50 MG tablet Take 1 tablet (50 mg total) by mouth daily. 90 tablet 1   No current facility-administered medications for this visit.   Allergies:  Acid reducer [cimetidine] and Mevacor [lovastatin]   Social History: The patient  reports that she has  quit smoking. She has never used smokeless tobacco. She reports that she does not currently use alcohol after a past usage of about 1.0 standard drink of alcohol per week. She reports that she does not use drugs.   Family History: The patient's family history includes Alcohol abuse in her father; Dementia in her sister; Pneumonia in her father.   ROS:  Please see the history of present illness. Otherwise, complete review of systems is positive for none.  All other systems are reviewed and negative.   Physical Exam: VS:  BP (!) 116/50 (BP Location: Right Arm, Patient Position: Sitting, Cuff Size: Normal)    Pulse (!) 48   Ht '5\' 6"'$  (1.676 m)   Wt 126 lb 3 oz (57.2 kg)   SpO2 97%   BMI 20.37 kg/m , BMI Body mass index is 20.37 kg/m.  Wt Readings from Last 3 Encounters:  02/12/23 126 lb 3 oz (57.2 kg)  01/15/23 121 lb 4.1 oz (55 kg)  12/11/22 121 lb 3.2 oz (55 kg)    General: Patient appears comfortable at rest. HEENT: Conjunctiva and lids normal, oropharynx clear with moist mucosa. Neck: Supple, no elevated JVP or carotid bruits, no thyromegaly. Lungs: Clear to auscultation, nonlabored breathing at rest. Cardiac: Regular rate and rhythm, no S3 or significant systolic murmur, no pericardial rub. Abdomen: Soft, nontender, no hepatomegaly, bowel sounds present, no guarding or rebound. Extremities: No pitting edema, distal pulses 2+. Skin: Warm and dry. Musculoskeletal: No kyphosis. Neuropsychiatric: Alert and oriented to self  ECG: Sinus bradycardia with first-degree AV block  Recent Labwork: 11/25/2022: TSH 0.477 01/15/2023: ALT 15; AST 29; BUN 21; Creatinine, Ser 0.87; Hemoglobin 13.3; Platelets 304; Potassium 3.5; Sodium 141     Component Value Date/Time   CHOL 273 (H) 01/09/2022 1512   TRIG 244 (H) 01/09/2022 1512   TRIG 241 (H) 08/19/2014 0811   HDL 55 01/09/2022 1512   HDL 41 08/19/2014 0811   CHOLHDL 5.0 (H) 01/09/2022 1512   LDLCALC 172 (H) 01/09/2022 1512   LDLCALC 201 (H) 08/19/2014 [id number]    Other Studies Reviewed Today: Echocardiogram and event monitor  Assessment and Plan: Patient is 89 year old F known to have Alzheimer's dementia, HTN presented to cardiology clinic for evaluation of syncope.  # Questionable syncope # Sinus bradycardia with first-degree AV block -EKG showed NSR, first-degree AV block with PR more than 300 ms. Echocardiogram showed normal LVEF, mild MR, mild to moderate TR. No evidence of structural heart disease. Event monitor showed NSR, first-degree AV block, 42 brief SVT episodes (fastest lasting 4 beats and longest lasting 13 beats). No  recurrent syncopal episodes after the antihypertensive doses were decreased on 11/25/2022 ER visit. Patient complained of dizziness/lightheadedness when she moves quickly but per caregiver, patient climbs stairs and walks around with no symptoms. Heart rate today in the clinic is 48 but this should not cause any symptoms.  Instructed caretaker to check heart rates every day, in a.m. or p.m., maintain p.o. hydration of at least 2L fluids per day and management of hypothyroidism. If heart rate is less than 40 bpm, will need to make sure her thyroid function is normal and then hold donepezil. -If patient continues to feel dizzy despite having HR more than 40 bpm, antihypertensive medication doses will need to be further reduced.  # Valvular heart disease, mild MR and mild to moderate TR -Obtain 2D echocardiogram in 2 years  # HTN, controlled -Continue amlodipine 5 mg once daily -Continue lisinopril 20 mg once daily -  Management of HTN per PCP  # HLD -Continue atorvastatin 20 mg nightly -Management of HLD per PCP  I have spent a total of 30 minutes with patient reviewing chart , telemetry, EKGs, labs and examining patient as well as establishing an assessment and plan that was discussed with the patient.  > 50% of time was spent in direct patient care.    Medication Adjustments/Labs and Tests Ordered: Current medicines are reviewed at length with the patient today.  Concerns regarding medicines are outlined above.   Tests Ordered: No orders of the defined types were placed in this encounter.   Medication Changes: No orders of the defined types were placed in this encounter.   Disposition:  Follow up  2 years  Signed Idona Stach Fidel Levy, MD, 02/12/2023 10:05 AM    [facility] [place], [place], [place] [address]

## 2023-02-15 ENCOUNTER — Telehealth: Payer: Self-pay | Admitting: Internal Medicine

## 2023-02-15 NOTE — Telephone Encounter (Signed)
Orders faxed to [facility] office for review by [place].

## 2023-02-15 NOTE — Telephone Encounter (Signed)
[facility] is calling to get clarification on orders faxed back to them. Caller states that they are sending fax as well to be signed.   Fax # [phone]

## 2023-02-15 NOTE — Telephone Encounter (Signed)
Orders signed and faxed back to facility.

## 2023-02-21 DIAGNOSIS — E119 Type 2 diabetes mellitus without complications: Secondary | ICD-10-CM | POA: Diagnosis not present

## 2023-02-21 DIAGNOSIS — Z79899 Other long term (current) drug therapy: Secondary | ICD-10-CM | POA: Diagnosis not present

## 2023-02-21 DIAGNOSIS — D518 Other vitamin B12 deficiency anemias: Secondary | ICD-10-CM | POA: Diagnosis not present

## 2023-02-21 DIAGNOSIS — E038 Other specified hypothyroidism: Secondary | ICD-10-CM | POA: Diagnosis not present

## 2023-02-21 DIAGNOSIS — E782 Mixed hyperlipidemia: Secondary | ICD-10-CM | POA: Diagnosis not present

## 2023-02-26 DIAGNOSIS — G309 Alzheimer's disease, unspecified: Secondary | ICD-10-CM | POA: Diagnosis not present

## 2023-02-26 DIAGNOSIS — D518 Other vitamin B12 deficiency anemias: Secondary | ICD-10-CM | POA: Diagnosis not present

## 2023-02-26 DIAGNOSIS — I129 Hypertensive chronic kidney disease with stage 1 through stage 4 chronic kidney disease, or unspecified chronic kidney disease: Secondary | ICD-10-CM | POA: Diagnosis not present

## 2023-02-26 DIAGNOSIS — N189 Chronic kidney disease, unspecified: Secondary | ICD-10-CM | POA: Diagnosis not present

## 2023-02-26 DIAGNOSIS — I70223 Atherosclerosis of native arteries of extremities with rest pain, bilateral legs: Secondary | ICD-10-CM | POA: Diagnosis not present

## 2023-02-26 DIAGNOSIS — E119 Type 2 diabetes mellitus without complications: Secondary | ICD-10-CM | POA: Diagnosis not present

## 2023-02-26 DIAGNOSIS — E039 Hypothyroidism, unspecified: Secondary | ICD-10-CM | POA: Diagnosis not present

## 2023-02-26 DIAGNOSIS — I10 Essential (primary) hypertension: Secondary | ICD-10-CM | POA: Diagnosis not present

## 2023-02-26 DIAGNOSIS — E782 Mixed hyperlipidemia: Secondary | ICD-10-CM | POA: Diagnosis not present

## 2023-03-12 DIAGNOSIS — E785 Hyperlipidemia, unspecified: Secondary | ICD-10-CM | POA: Diagnosis not present

## 2023-03-12 DIAGNOSIS — G309 Alzheimer's disease, unspecified: Secondary | ICD-10-CM | POA: Diagnosis not present

## 2023-03-12 DIAGNOSIS — I10 Essential (primary) hypertension: Secondary | ICD-10-CM | POA: Diagnosis not present

## 2023-03-12 DIAGNOSIS — E039 Hypothyroidism, unspecified: Secondary | ICD-10-CM | POA: Diagnosis not present

## 2023-03-12 DIAGNOSIS — N189 Chronic kidney disease, unspecified: Secondary | ICD-10-CM | POA: Diagnosis not present

## 2023-03-12 DIAGNOSIS — G629 Polyneuropathy, unspecified: Secondary | ICD-10-CM | POA: Diagnosis not present

## 2023-03-12 DIAGNOSIS — G4700 Insomnia, unspecified: Secondary | ICD-10-CM | POA: Diagnosis not present

## 2023-03-21 DIAGNOSIS — R079 Chest pain, unspecified: Secondary | ICD-10-CM | POA: Diagnosis not present

## 2023-03-25 DIAGNOSIS — E119 Type 2 diabetes mellitus without complications: Secondary | ICD-10-CM | POA: Diagnosis not present

## 2023-03-25 DIAGNOSIS — G309 Alzheimer's disease, unspecified: Secondary | ICD-10-CM | POA: Diagnosis not present

## 2023-03-25 DIAGNOSIS — I129 Hypertensive chronic kidney disease with stage 1 through stage 4 chronic kidney disease, or unspecified chronic kidney disease: Secondary | ICD-10-CM | POA: Diagnosis not present

## 2023-03-25 DIAGNOSIS — E039 Hypothyroidism, unspecified: Secondary | ICD-10-CM | POA: Diagnosis not present

## 2023-03-25 DIAGNOSIS — N189 Chronic kidney disease, unspecified: Secondary | ICD-10-CM | POA: Diagnosis not present

## 2023-03-25 DIAGNOSIS — I70223 Atherosclerosis of native arteries of extremities with rest pain, bilateral legs: Secondary | ICD-10-CM | POA: Diagnosis not present

## 2023-03-25 DIAGNOSIS — D518 Other vitamin B12 deficiency anemias: Secondary | ICD-10-CM | POA: Diagnosis not present

## 2023-03-25 DIAGNOSIS — I10 Essential (primary) hypertension: Secondary | ICD-10-CM | POA: Diagnosis not present

## 2023-03-25 DIAGNOSIS — E782 Mixed hyperlipidemia: Secondary | ICD-10-CM | POA: Diagnosis not present

## 2023-04-08 DIAGNOSIS — I10 Essential (primary) hypertension: Secondary | ICD-10-CM | POA: Diagnosis not present

## 2023-04-09 DIAGNOSIS — E039 Hypothyroidism, unspecified: Secondary | ICD-10-CM | POA: Diagnosis not present

## 2023-04-09 DIAGNOSIS — E782 Mixed hyperlipidemia: Secondary | ICD-10-CM | POA: Diagnosis not present

## 2023-04-09 DIAGNOSIS — N189 Chronic kidney disease, unspecified: Secondary | ICD-10-CM | POA: Diagnosis not present

## 2023-04-09 DIAGNOSIS — K5790 Diverticulosis of intestine, part unspecified, without perforation or abscess without bleeding: Secondary | ICD-10-CM | POA: Diagnosis not present

## 2023-04-09 DIAGNOSIS — I10 Essential (primary) hypertension: Secondary | ICD-10-CM | POA: Diagnosis not present

## 2023-04-13 ENCOUNTER — Emergency Department (HOSPITAL_COMMUNITY): Payer: Medicare Other

## 2023-04-13 ENCOUNTER — Emergency Department (HOSPITAL_COMMUNITY)
Admission: EM | Admit: 2023-04-13 | Discharge: 2023-04-14 | Disposition: A | Discharge status: Skilled Nursing Facility | Payer: Medicare Other | Attending: Emergency Medicine | Admitting: Emergency Medicine

## 2023-04-13 ENCOUNTER — Other Ambulatory Visit: Payer: Self-pay

## 2023-04-13 ENCOUNTER — Encounter (HOSPITAL_COMMUNITY): Payer: Self-pay

## 2023-04-13 DIAGNOSIS — W1830XA Fall on same level, unspecified, initial encounter: Secondary | ICD-10-CM | POA: Insufficient documentation

## 2023-04-13 DIAGNOSIS — S638X1A Sprain of other part of right wrist and hand, initial encounter: Secondary | ICD-10-CM | POA: Diagnosis not present

## 2023-04-13 DIAGNOSIS — I499 Cardiac arrhythmia, unspecified: Secondary | ICD-10-CM | POA: Diagnosis not present

## 2023-04-13 DIAGNOSIS — S63501A Unspecified sprain of right wrist, initial encounter: Secondary | ICD-10-CM | POA: Diagnosis not present

## 2023-04-13 DIAGNOSIS — F0390 Unspecified dementia without behavioral disturbance: Secondary | ICD-10-CM | POA: Diagnosis not present

## 2023-04-13 DIAGNOSIS — S66811A Strain of other specified muscles, fascia and tendons at wrist and hand level, right hand, initial encounter: Secondary | ICD-10-CM | POA: Diagnosis not present

## 2023-04-13 DIAGNOSIS — S60911A Unspecified superficial injury of right wrist, initial encounter: Secondary | ICD-10-CM | POA: Diagnosis present

## 2023-04-13 DIAGNOSIS — R404 Transient alteration of awareness: Secondary | ICD-10-CM | POA: Diagnosis not present

## 2023-04-13 DIAGNOSIS — R55 Syncope and collapse: Secondary | ICD-10-CM | POA: Insufficient documentation

## 2023-04-13 DIAGNOSIS — S66911A Strain of unspecified muscle, fascia and tendon at wrist and hand level, right hand, initial encounter: Secondary | ICD-10-CM | POA: Insufficient documentation

## 2023-04-13 DIAGNOSIS — W19XXXA Unspecified fall, initial encounter: Secondary | ICD-10-CM

## 2023-04-13 DIAGNOSIS — Z7982 Long term (current) use of aspirin: Secondary | ICD-10-CM | POA: Diagnosis not present

## 2023-04-13 DIAGNOSIS — Z743 Need for continuous supervision: Secondary | ICD-10-CM | POA: Diagnosis not present

## 2023-04-13 DIAGNOSIS — R001 Bradycardia, unspecified: Secondary | ICD-10-CM | POA: Insufficient documentation

## 2023-04-13 DIAGNOSIS — S6992XA Unspecified injury of left wrist, hand and finger(s), initial encounter: Secondary | ICD-10-CM | POA: Diagnosis not present

## 2023-04-13 DIAGNOSIS — R6889 Other general symptoms and signs: Secondary | ICD-10-CM | POA: Diagnosis not present

## 2023-04-13 DIAGNOSIS — M25531 Pain in right wrist: Secondary | ICD-10-CM | POA: Diagnosis not present

## 2023-04-13 LAB — CBC
HCT: 39.6 % (ref 36.0–46.0)
Hemoglobin: 13.2 g/dL (ref 12.0–15.0)
MCH: 31 pg (ref 26.0–34.0)
MCHC: 33.3 g/dL (ref 30.0–36.0)
MCV: 93 fL (ref 80.0–100.0)
Platelets: 273 10*3/uL (ref 150–400)
RBC: 4.26 MIL/uL (ref 3.87–5.11)
RDW: 13.3 % (ref 11.5–15.5)
WBC: 10.6 10*3/uL — ABNORMAL HIGH (ref 4.0–10.5)
nRBC: 0 % (ref 0.0–0.2)

## 2023-04-13 LAB — BASIC METABOLIC PANEL
Anion gap: 8 (ref 5–15)
BUN: 14 mg/dL (ref 8–23)
CO2: 29 mmol/L (ref 22–32)
Calcium: 8.5 mg/dL — ABNORMAL LOW (ref 8.9–10.3)
Chloride: 102 mmol/L (ref 98–111)
Creatinine, Ser: 0.65 mg/dL (ref 0.44–1.00)
GFR, Estimated: >60 mL/min (ref >60)
Glucose, Bld: 105 mg/dL — ABNORMAL HIGH (ref 70–99)
Potassium: 3.5 mmol/L (ref 3.5–5.1)
Sodium: 139 mmol/L (ref 135–145)

## 2023-04-13 MED ORDER — IBUPROFEN 400 MG PO TABS
200.0000 mg | ORAL_TABLET | Freq: Once | ORAL | Status: AC
  Administered 2023-04-13: 200 mg via ORAL
  Filled 2023-04-13: qty 1

## 2023-04-13 MED ORDER — ACETAMINOPHEN 325 MG PO TABS
650.0000 mg | ORAL_TABLET | Freq: Once | ORAL | Status: AC
  Administered 2023-04-13: 650 mg via ORAL
  Filled 2023-04-13: qty 2

## 2023-04-13 NOTE — ED Notes (Signed)
Spoke with Elmarie Shiley, a nurse from [facility]. She states that pt. Has been having issues with her BP up and down, along with her HR. Said the pt wore a heart monitor around Jan. 2024, and has been advised for pt to come to ED if HR drops below 40, or SBP less than 90. Said pt got up and walked towards the doorway this am and he knees "buckled" and she began to shake. Staff were able to lower pt to ground and then called 911. States that pt has been to the ED numerous times for same issue.

## 2023-04-13 NOTE — ED Triage Notes (Signed)
BIB [facility] for anxiety/attack. EMS states pt walked into dayroom this am, started shaking, and fell out. Witnessed by facility nurse. Per EMS, VS WNL, but CO2 irregular. No hisotry of seizures, does have history of anxiety but takes not meds for it.

## 2023-04-13 NOTE — ED Provider Notes (Signed)
[place] EMERGENCY DEPARTMENT AT [facility] Provider Note   CSN: [id number] Arrival date & time: 04/13/23  8295     History  Chief Complaint  Patient presents with   Anxiety    April [name] is a 89 y.o. female.  Pt with hx dementia, was noted to walk into dayroom area to day, appear shaky, and then 'fell out'. No seizure activity noted. Pt with hx anxiety and felt to possibly be having an anxiety attack. Pt is also noted to have history bradycardia. Pt with dementia, limited historian - level 5 caveat. No noted recent change in meds. Pt denies any pain or injury, and indicates she feels fine. No faintness or dizziness. Pt denies headache. No chest pain or discomfort. No sob or unusual doe. No abd pain or nvd. No dysuria or gu c/o. No nec, back, or extremity pain or injury. Skin intact.   The history is provided by the patient, the EMS personnel and medical records. The history is limited by the condition of the patient.  Anxiety Pertinent negatives include no chest pain, no abdominal pain, no headaches and no shortness of breath.       Home Medications Prior to Admission medications   Medication Sig Start Date End Date Taking? Authorizing Provider  amLODipine (NORVASC) 5 MG tablet Take 5 mg by mouth daily.    [provider]  aspirin 81 MG tablet Take 1 tablet (81 mg total) by mouth daily with breakfast. 11/25/22   Emokpae, Courage, MD  atorvastatin (LIPITOR) 20 MG tablet Take 20 mg by mouth daily. 11/20/22   [provider]  donepezil (ARICEPT) 10 MG tablet Take 1 tablet (10 mg total) by mouth at bedtime. 01/09/22   Daphine Deutscher, [name], FNP  Ferrous Sulfate (IRON) 325 (65 Fe) MG TABS Take 1 tablet by mouth daily. 12/11/22   [provider]  folic acid (FOLVITE) 1 MG tablet Take 1 mg by mouth daily. 01/15/23   [provider]  levothyroxine (SYNTHROID) 75 MCG tablet Take 1 tablet (75 mcg total) by mouth daily before breakfast. 01/09/22    Daphine Deutscher, [name], FNP  lisinopril (ZESTRIL) 20 MG tablet Take 1 tablet (20 mg total) by mouth daily. 11/25/22   Shon Hale, MD  melatonin 3 MG TABS tablet Take 3 mg by mouth at bedtime.    [provider]  memantine (NAMENDA XR) 21 MG CP24 24 hr capsule TAKE (1) CAPSULE DAILY 01/09/22   Daphine Deutscher, [name], FNP  QUEtiapine (SEROQUEL) 25 MG tablet Take 1 tablet (25 mg total) by mouth at bedtime. Patient taking differently: Take 25 mg by mouth 2 (two) times daily. 11/25/22   Shon Hale, MD  sertraline (ZOLOFT) 50 MG tablet Take 1 tablet (50 mg total) by mouth daily. 01/09/22   Daphine Deutscher [name], FNP      Allergies    Acid reducer [cimetidine] and Mevacor [lovastatin]    Review of Systems   Review of Systems  Constitutional:  Negative for chills and fever.  HENT:  Negative for nosebleeds.   Eyes:  Negative for visual disturbance.  Respiratory:  Negative for cough and shortness of breath.   Cardiovascular:  Negative for chest pain and leg swelling.  Gastrointestinal:  Negative for abdominal pain, blood in stool, diarrhea and vomiting.  Genitourinary:  Negative for dysuria and flank pain.  Musculoskeletal:  Negative for back pain and neck pain.  Skin:  Negative for rash.  Neurological:  Negative for speech difficulty, weakness, numbness and headaches.  Hematological:  Does not bruise/bleed easily.    Physical Exam Updated Vital Signs BP (!) 155/79   Pulse (!) 54   Temp 98.1 F (36.7 C)   Resp 18   Ht 1.676 m (5\' 6" )   Wt 49.9 kg   SpO2 97%   BMI 17.75 kg/m  Physical Exam Vitals and nursing note reviewed.  Constitutional:      Appearance: Normal appearance. She is well-developed.  HENT:     Head: Atraumatic.     Nose: Nose normal.     Mouth/Throat:     Mouth: Mucous membranes are moist.  Eyes:     General: No scleral icterus.    Conjunctiva/sclera: Conjunctivae normal.     Pupils: Pupils are equal, round, and reactive to light.  Neck:      Vascular: No carotid bruit.     Trachea: No tracheal deviation.  Cardiovascular:     Rate and Rhythm: Regular rhythm. Bradycardia present.     Pulses: Normal pulses.     Heart sounds: Normal heart sounds. No murmur heard.    No friction rub. No gallop.  Pulmonary:     Effort: Pulmonary effort is normal. No respiratory distress.     Breath sounds: Normal breath sounds.  Chest:     Chest wall: No tenderness.  Abdominal:     General: Bowel sounds are normal. There is no distension.     Palpations: Abdomen is soft.     Tenderness: There is no abdominal tenderness. There is no guarding.  Genitourinary:    Comments: No cva tenderness.  Musculoskeletal:        General: No swelling.     Cervical back: Normal range of motion and neck supple. No rigidity or tenderness. No muscular tenderness.     Comments: CTLS spine, non tender, aligned, no step off. Good rom bil extremities without pain or focal bony tenderness.   Skin:    General: Skin is warm and dry.     Findings: No rash.  Neurological:     Mental Status: She is alert.     Comments: Alert, speech normal. Motor/sens grossly intact bil.   Psychiatric:        Mood and Affect: Mood normal.     ED Results / Procedures / Treatments   Labs (all labs ordered are listed, but only abnormal results are displayed) Results for orders placed or performed during the hospital encounter of 04/13/23  CBC  Result Value Ref Range   WBC 10.6 (H) 4.0 - 10.5 K/uL   RBC 4.26 3.87 - 5.11 MIL/uL   Hemoglobin 13.2 12.0 - 15.0 g/dL   HCT 16.1 09.6 - 04.5 %   MCV 93.0 80.0 - 100.0 fL   MCH 31.0 26.0 - 34.0 pg   MCHC 33.3 30.0 - 36.0 g/dL   RDW 40.9 81.1 - 91.4 %   Platelets 273 150 - 400 K/uL   nRBC 0.0 0.0 - 0.2 %  Basic metabolic panel  Result Value Ref Range   Sodium 139 135 - 145 mmol/L   Potassium 3.5 3.5 - 5.1 mmol/L   Chloride 102 98 - 111 mmol/L   CO2 29 22 - 32 mmol/L   Glucose, Bld 105 (H) 70 - 99 mg/dL   BUN 14 8 - 23 mg/dL    Creatinine, Ser 7.82 0.44 - 1.00 mg/dL   Calcium 8.5 (L) 8.9 - 10.3 mg/dL   GFR, Estimated >95 >62 mL/min   Anion gap 8 5 - 15  EKG EKG Interpretation  Date/Time:  Saturday Apr 13 2023 09:17:41 EDT Ventricular Rate:  46 PR Interval:  272 QRS Duration: 100 QT Interval:  522 QTC Calculation: 457 R Axis:   64 Text Interpretation: Sinus bradycardia Prolonged PR interval Nonspecific T wave abnormality Confirmed by Cathren Laine ([id number]) on 04/13/2023 9:30:24 AM  Radiology DG Wrist Complete Right  Result Date: 04/13/2023 CLINICAL DATA:  Fall.  Possible seizure.  Right wrist pain. EXAM: RIGHT WRIST - COMPLETE 3+ VIEW COMPARISON:  None Available. FINDINGS: No fracture. No bone lesion. Skeletal structures are diffusely demineralized. Joint space narrowing, mild subchondral sclerosis and small marginal osteophytes at the scaphoid trapezium trapezoid articulation and trapezium first metacarpal articulation. Milder joint space narrowing between the distal radius and scaphoid and lunate and capitate. Soft tissues are unremarkable. IMPRESSION: 1. No fracture or dislocation. 2. Degenerative changes as detailed. Electronically Signed   By: Amie Portland M.D.   On: 04/13/2023 13:17   DG Wrist Complete Left  Addendum Date: 04/13/2023   ADDENDUM REPORT: 04/13/2023 12:19 ADDENDUM: This examination is actually of the right wrist. Films are marked appropriately but the exam order is right left reversed. Electronically Signed   By: Paulina Fusi M.D.   On: 04/13/2023 12:19   Result Date: 04/13/2023 CLINICAL DATA:  Larey Seat with trauma to the hand and wrist EXAM: LEFT WRIST - COMPLETE 3+ VIEW COMPARISON:  None Available. FINDINGS: No acute fracture or dislocation. Chronic degenerative changes at the first carpometacarpal articulation. Chronic degenerative change of the radiocarpal joint. Chronic chondrocalcinosis. IMPRESSION: No acute or traumatic finding. Chronic degenerative changes as above. Electronically Signed: By:  Paulina Fusi M.D. On: 04/13/2023 11:07    Procedures Procedures    Medications Ordered in ED Medications  acetaminophen (TYLENOL) tablet 650 mg (has no administration in time range)  ibuprofen (ADVIL) tablet 200 mg (has no administration in time range)    ED Course/ Medical Decision Making/ A&P                             Medical Decision Making Problems Addressed: Accidental fall, initial encounter: acute illness or injury with systemic symptoms that poses a threat to life or bodily functions Sinus bradycardia: chronic illness or injury with exacerbation, progression, or side effects of treatment that poses a threat to life or bodily functions Sprain and strain of right wrist: acute illness or injury Syncope and collapse: acute illness or injury with systemic symptoms that poses a threat to life or bodily functions  Amount and/or Complexity of Data Reviewed Independent Historian: EMS    Details: hx External Data Reviewed: ECG and notes. Labs: ordered. Decision-making details documented in ED Course. Radiology: ordered and independent interpretation performed. Decision-making details documented in ED Course. ECG/medicine tests: ordered and independent interpretation performed. Decision-making details documented in ED Course.  Risk OTC drugs. Prescription drug management. Decision regarding hospitalization.   Iv ns. Continuous pulse ox and cardiac monitoring. Labs ordered/sent.   Differential diagnosis includes syncope, orthostatic hypotension, anemia, dehydration, etc. Dispo decision including potential need for admission considered - will get labs and reassess.   Reviewed nursing notes and prior charts for additional history. External reports reviewed. Additional history from: EMS.   Cardiac monitor: sinus rhythm, rate 48. Similar heart rate in past and prior cardiology eval for same - no intervention recommended then.  As brady/syncope, will refer to close cardiology f/u.    Labs reviewed/interpreted by me - chem normal. Hgb normal.  Afebrile. Normal/baseline mental status. No gu c/o. No resp c/o. No pain.   Recheck pt, right wrist tenderness. No focal scaphoid tenderness. No erythema or increased warmth to area. Will get imaging.   Xrays reviewed/interpreted by me - no fx.   Po fluids/food. Ambulate in hall/assist. Pt is ambulatory without faintness, dizziness or falling.  Recheck, hr mid 50s, bp normal.   Acetaminophen po, ibuprofen po, wrist brace.   No faintness or dizziness. No pain or other c/o.  Pt currently appears stable for d/c.   Return precautions provided.            Final Clinical Impression(s) / ED Diagnoses Final diagnoses:  Sinus bradycardia  Syncope and collapse  Accidental fall, initial encounter  Sprain and strain of right wrist    Rx / DC Orders ED Discharge Orders          Ordered    Ambulatory referral to Cardiology       Comments: If you have not heard from the Cardiology office within the next 72 hours please call [phone].   04/13/23 1447              Cathren Laine, MD 04/13/23 1449

## 2023-04-13 NOTE — ED Notes (Signed)
Pt c/o left wrist hurting. States she may have hit it when she fell. It is swollen somewhat. Notifying EDP

## 2023-04-13 NOTE — ED Notes (Signed)
Pt monitor indicates bradycardia. Was told by EMS that Pt did at one time wear a heart monitor but no longer does. Contacting facility for more information

## 2023-04-13 NOTE — ED Notes (Signed)
Ambulated patient to the bathroom. Patient was wobbly on the way to the bathroom and lost balance twice (stumbling). On the way back to the room patient was more steady on her feet. Was able to get in and out of bed with no problems with pain to the extremities. No O 2 was worn while walking to the bathroom. Placed back onto the monitor and oxygen.

## 2023-04-13 NOTE — Discharge Instructions (Addendum)
It was our pleasure to provide your ER care today - we hope that you feel better.  Fall precautions. Drink plenty of fluids/stay well hydrated. Take acetaminophen or ibuprofen as need.   Follow up closely with primary care doctor and/or cardiologist in the next 1-2 weeks.   Return to ER if worse, new symptoms, fevers, new/severe pain, chest pain, trouble breathing, fainting, or other emergency concern.

## 2023-04-16 DIAGNOSIS — T50905A Adverse effect of unspecified drugs, medicaments and biological substances, initial encounter: Secondary | ICD-10-CM | POA: Diagnosis not present

## 2023-04-16 DIAGNOSIS — R001 Bradycardia, unspecified: Secondary | ICD-10-CM | POA: Diagnosis not present

## 2023-04-17 ENCOUNTER — Emergency Department (HOSPITAL_COMMUNITY): Payer: Medicare Other

## 2023-04-17 ENCOUNTER — Other Ambulatory Visit: Payer: Self-pay

## 2023-04-17 ENCOUNTER — Observation Stay (HOSPITAL_COMMUNITY)
Admission: EM | Admit: 2023-04-17 | Discharge: 2023-04-18 | Disposition: A | Discharge status: Other Acute Inpatient Hospital | Payer: Medicare Other | Attending: Family Medicine | Admitting: Family Medicine

## 2023-04-17 ENCOUNTER — Encounter (HOSPITAL_COMMUNITY): Payer: Self-pay

## 2023-04-17 DIAGNOSIS — E876 Hypokalemia: Secondary | ICD-10-CM

## 2023-04-17 DIAGNOSIS — F0284 Dementia in other diseases classified elsewhere, unspecified severity, with anxiety: Secondary | ICD-10-CM | POA: Insufficient documentation

## 2023-04-17 DIAGNOSIS — I951 Orthostatic hypotension: Secondary | ICD-10-CM | POA: Diagnosis present

## 2023-04-17 DIAGNOSIS — I959 Hypotension, unspecified: Secondary | ICD-10-CM | POA: Diagnosis not present

## 2023-04-17 DIAGNOSIS — Z87891 Personal history of nicotine dependence: Secondary | ICD-10-CM | POA: Diagnosis not present

## 2023-04-17 DIAGNOSIS — N390 Urinary tract infection, site not specified: Secondary | ICD-10-CM | POA: Diagnosis not present

## 2023-04-17 DIAGNOSIS — R6889 Other general symptoms and signs: Secondary | ICD-10-CM | POA: Diagnosis not present

## 2023-04-17 DIAGNOSIS — E785 Hyperlipidemia, unspecified: Secondary | ICD-10-CM | POA: Diagnosis not present

## 2023-04-17 DIAGNOSIS — F0280 Dementia in other diseases classified elsewhere without behavioral disturbance: Secondary | ICD-10-CM

## 2023-04-17 DIAGNOSIS — R0689 Other abnormalities of breathing: Secondary | ICD-10-CM | POA: Diagnosis not present

## 2023-04-17 DIAGNOSIS — I10 Essential (primary) hypertension: Secondary | ICD-10-CM | POA: Diagnosis not present

## 2023-04-17 DIAGNOSIS — G309 Alzheimer's disease, unspecified: Secondary | ICD-10-CM | POA: Diagnosis not present

## 2023-04-17 DIAGNOSIS — Z7982 Long term (current) use of aspirin: Secondary | ICD-10-CM | POA: Diagnosis not present

## 2023-04-17 DIAGNOSIS — Z743 Need for continuous supervision: Secondary | ICD-10-CM | POA: Diagnosis not present

## 2023-04-17 DIAGNOSIS — F411 Generalized anxiety disorder: Secondary | ICD-10-CM | POA: Diagnosis not present

## 2023-04-17 DIAGNOSIS — I4581 Long QT syndrome: Secondary | ICD-10-CM | POA: Diagnosis not present

## 2023-04-17 DIAGNOSIS — Z1152 Encounter for screening for COVID-19: Secondary | ICD-10-CM | POA: Insufficient documentation

## 2023-04-17 DIAGNOSIS — Z79899 Other long term (current) drug therapy: Secondary | ICD-10-CM | POA: Diagnosis not present

## 2023-04-17 DIAGNOSIS — R519 Headache, unspecified: Secondary | ICD-10-CM | POA: Diagnosis not present

## 2023-04-17 DIAGNOSIS — G301 Alzheimer's disease with late onset: Secondary | ICD-10-CM

## 2023-04-17 DIAGNOSIS — R001 Bradycardia, unspecified: Secondary | ICD-10-CM | POA: Insufficient documentation

## 2023-04-17 DIAGNOSIS — E039 Hypothyroidism, unspecified: Secondary | ICD-10-CM | POA: Insufficient documentation

## 2023-04-17 DIAGNOSIS — R571 Hypovolemic shock: Secondary | ICD-10-CM | POA: Diagnosis not present

## 2023-04-17 LAB — URINALYSIS, W/ REFLEX TO CULTURE (INFECTION SUSPECTED)
Bilirubin Urine: NEGATIVE
Glucose, UA: NEGATIVE mg/dL
Hgb urine dipstick: NEGATIVE
Ketones, ur: NEGATIVE mg/dL
Nitrite: NEGATIVE
Protein, ur: NEGATIVE mg/dL
Specific Gravity, Urine: 1.012 (ref 1.005–1.030)
pH: 6 (ref 5.0–8.0)

## 2023-04-17 LAB — CBC WITH DIFFERENTIAL/PLATELET
Abs Immature Granulocytes: 0.06 10*3/uL (ref 0.00–0.07)
Basophils Absolute: 0 10*3/uL (ref 0.0–0.1)
Basophils Relative: 0 %
Eosinophils Absolute: 0 10*3/uL (ref 0.0–0.5)
Eosinophils Relative: 0 %
HCT: 35.4 % — ABNORMAL LOW (ref 36.0–46.0)
Hemoglobin: 11.4 g/dL — ABNORMAL LOW (ref 12.0–15.0)
Immature Granulocytes: 1 %
Lymphocytes Relative: 8 %
Lymphs Abs: 0.9 10*3/uL (ref 0.7–4.0)
MCH: 30.5 pg (ref 26.0–34.0)
MCHC: 32.2 g/dL (ref 30.0–36.0)
MCV: 94.7 fL (ref 80.0–100.0)
Monocytes Absolute: 1.3 10*3/uL — ABNORMAL HIGH (ref 0.1–1.0)
Monocytes Relative: 13 %
Neutro Abs: 7.8 10*3/uL — ABNORMAL HIGH (ref 1.7–7.7)
Neutrophils Relative %: 78 %
Platelets: 282 10*3/uL (ref 150–400)
RBC: 3.74 MIL/uL — ABNORMAL LOW (ref 3.87–5.11)
RDW: 13.6 % (ref 11.5–15.5)
WBC: 10.1 10*3/uL (ref 4.0–10.5)
nRBC: 0 % (ref 0.0–0.2)

## 2023-04-17 LAB — COMPREHENSIVE METABOLIC PANEL
ALT: 13 U/L (ref 0–44)
AST: 22 U/L (ref 15–41)
Albumin: 2.6 g/dL — ABNORMAL LOW (ref 3.5–5.0)
Alkaline Phosphatase: 66 U/L (ref 38–126)
Anion gap: 9 (ref 5–15)
BUN: 16 mg/dL (ref 8–23)
CO2: 27 mmol/L (ref 22–32)
Calcium: 8 mg/dL — ABNORMAL LOW (ref 8.9–10.3)
Chloride: 101 mmol/L (ref 98–111)
Creatinine, Ser: 0.73 mg/dL (ref 0.44–1.00)
GFR, Estimated: >60 mL/min (ref >60)
Glucose, Bld: 137 mg/dL — ABNORMAL HIGH (ref 70–99)
Potassium: 2.7 mmol/L — CL (ref 3.5–5.1)
Sodium: 137 mmol/L (ref 135–145)
Total Bilirubin: 0.8 mg/dL (ref 0.3–1.2)
Total Protein: 5.8 g/dL — ABNORMAL LOW (ref 6.5–8.1)

## 2023-04-17 LAB — URINE CULTURE

## 2023-04-17 LAB — SARS CORONAVIRUS 2 BY RT PCR: SARS Coronavirus 2 by RT PCR: NEGATIVE

## 2023-04-17 LAB — MAGNESIUM: Magnesium: 1.8 mg/dL (ref 1.7–2.4)

## 2023-04-17 MED ORDER — LACTATED RINGERS IV BOLUS
500.0000 mL | Freq: Once | INTRAVENOUS | Status: AC
  Administered 2023-04-17: 500 mL via INTRAVENOUS

## 2023-04-17 MED ORDER — SODIUM CHLORIDE 0.9 % IV SOLN
1.0000 g | INTRAVENOUS | Status: DC
  Administered 2023-04-17: 1 g via INTRAVENOUS
  Filled 2023-04-17: qty 10

## 2023-04-17 MED ORDER — POTASSIUM CHLORIDE CRYS ER 20 MEQ PO TBCR
40.0000 meq | EXTENDED_RELEASE_TABLET | Freq: Once | ORAL | Status: AC
  Administered 2023-04-17: 40 meq via ORAL
  Filled 2023-04-17: qty 2

## 2023-04-17 MED ORDER — SODIUM CHLORIDE 0.9% FLUSH
3.0000 mL | Freq: Two times a day (BID) | INTRAVENOUS | Status: DC
  Administered 2023-04-17 – 2023-04-18 (×2): 3 mL via INTRAVENOUS

## 2023-04-17 MED ORDER — MELATONIN 3 MG PO TABS
3.0000 mg | ORAL_TABLET | Freq: Every day | ORAL | Status: DC
  Administered 2023-04-17: 3 mg via ORAL
  Filled 2023-04-17: qty 1

## 2023-04-17 MED ORDER — SERTRALINE HCL 50 MG PO TABS
50.0000 mg | ORAL_TABLET | Freq: Every day | ORAL | Status: DC
  Administered 2023-04-18: 50 mg via ORAL
  Filled 2023-04-17: qty 1

## 2023-04-17 MED ORDER — ACETAMINOPHEN 325 MG PO TABS: 650.0000 mg | ORAL_TABLET | Freq: Four times a day (QID) | ORAL | Status: DC | PRN

## 2023-04-17 MED ORDER — POTASSIUM CHLORIDE 10 MEQ/100ML IV SOLN
10.0000 meq | INTRAVENOUS | Status: AC
  Administered 2023-04-17 (×2): 10 meq via INTRAVENOUS
  Filled 2023-04-17 (×2): qty 100

## 2023-04-17 MED ORDER — QUETIAPINE FUMARATE 25 MG PO TABS
25.0000 mg | ORAL_TABLET | Freq: Every day | ORAL | Status: DC
  Administered 2023-04-17: 25 mg via ORAL
  Filled 2023-04-17: qty 1

## 2023-04-17 MED ORDER — LEVOTHYROXINE SODIUM 75 MCG PO TABS
75.0000 ug | ORAL_TABLET | Freq: Every day | ORAL | Status: DC
  Administered 2023-04-18: 75 ug via ORAL
  Filled 2023-04-17: qty 1

## 2023-04-17 MED ORDER — ACETAMINOPHEN 650 MG RE SUPP: 650.0000 mg | Freq: Four times a day (QID) | RECTAL | Status: DC | PRN

## 2023-04-17 MED ORDER — MAGNESIUM SULFATE 2 GM/50ML IV SOLN
2.0000 g | Freq: Once | INTRAVENOUS | Status: AC
  Administered 2023-04-17: 2 g via INTRAVENOUS
  Filled 2023-04-17: qty 50

## 2023-04-17 MED ORDER — ENOXAPARIN SODIUM 40 MG/0.4ML IJ SOSY
40.0000 mg | PREFILLED_SYRINGE | INTRAMUSCULAR | Status: DC
  Administered 2023-04-17: 40 mg via SUBCUTANEOUS
  Filled 2023-04-17: qty 0.4

## 2023-04-17 MED ORDER — MEMANTINE HCL ER 7 MG PO CP24
21.0000 mg | ORAL_CAPSULE | Freq: Every day | ORAL | Status: DC
  Administered 2023-04-18: 21 mg via ORAL
  Filled 2023-04-17 (×2): qty 3

## 2023-04-17 NOTE — ED Provider Notes (Signed)
[place] EMERGENCY DEPARTMENT AT [facility] Provider Note   CSN: [id number] Arrival date & time: 04/17/23  4098     History  Chief Complaint  Patient presents with   Hypotension    April [name] is a 89 y.o. female.  HPI 89 year old female presents from her NH with low blood pressures. Patient has dementia and cannot provide significant history. Per EMS she was complaining of a headache, and BP was in 70s on their arrival. Given 500 cc IV fluid by EMS. Currently resting and with no complaints for me.   Home Medications Prior to Admission medications   Medication Sig Start Date End Date Taking? Authorizing Provider  amLODipine (NORVASC) 5 MG tablet Take 5 mg by mouth daily.   Yes [provider]  aspirin 81 MG tablet Take 1 tablet (81 mg total) by mouth daily with breakfast. 11/25/22  Yes Emokpae, Courage, MD  atorvastatin (LIPITOR) 20 MG tablet Take 20 mg by mouth daily. 11/20/22  Yes [provider]  folic acid (FOLVITE) 1 MG tablet Take 1 mg by mouth daily. 01/15/23  Yes [provider]  levothyroxine (SYNTHROID) 75 MCG tablet Take 1 tablet (75 mcg total) by mouth daily before breakfast. 01/09/22  Yes Daphine Deutscher, [name], FNP  lisinopril (ZESTRIL) 20 MG tablet Take 1 tablet (20 mg total) by mouth daily. 11/25/22  Yes Emokpae, Courage, MD  melatonin 3 MG TABS tablet Take 3 mg by mouth at bedtime.   Yes [provider]  memantine (NAMENDA XR) 21 MG CP24 24 hr capsule TAKE (1) CAPSULE DAILY 01/09/22  Yes Daphine Deutscher, [name], FNP  mirtazapine (REMERON) 7.5 MG tablet Take 7.5 mg by mouth at bedtime. 04/11/23  Yes [provider]  QUEtiapine (SEROQUEL) 25 MG tablet Take 1 tablet (25 mg total) by mouth at bedtime. Patient taking differently: Take 25 mg by mouth 2 (two) times daily. 11/25/22  Yes Emokpae, Courage, MD  sertraline (ZOLOFT) 50 MG tablet Take 1 tablet (50 mg total) by mouth daily. 01/09/22  Yes Martin, [name], FNP   donepezil (ARICEPT) 10 MG tablet Take 1 tablet (10 mg total) by mouth at bedtime. Patient not taking: Reported on 04/17/2023 01/09/22   Bennie Pierini, FNP  donepezil (ARICEPT) 5 MG tablet Take 5 mg by mouth daily. 04/16/23   [provider]  Ferrous Sulfate (IRON) 325 (65 Fe) MG TABS Take 1 tablet by mouth daily. Patient not taking: Reported on 04/17/2023 12/11/22   [provider]      Allergies    Acid reducer [cimetidine] and Mevacor [lovastatin]    Review of Systems   Review of Systems  Unable to perform ROS: Mental status change    Physical Exam Updated Vital Signs BP (!) 98/52   Pulse (!) 48   Temp 97.9 F (36.6 C) (Rectal)   Resp 16   SpO2 93%  Physical Exam Vitals and nursing note reviewed.  Constitutional:      Appearance: She is well-developed.  HENT:     Head: Normocephalic and atraumatic.  Cardiovascular:     Rate and Rhythm: Normal rate and regular rhythm.     Heart sounds: Normal heart sounds.  Pulmonary:     Effort: Pulmonary effort is normal.     Breath sounds: Normal breath sounds.  Abdominal:     General: There is no distension.     Palpations: Abdomen is soft.     Tenderness: There is no abdominal tenderness. There is no right CVA tenderness  or left CVA tenderness.  Musculoskeletal:     Cervical back: No rigidity.     Right lower leg: No edema.     Left lower leg: No edema.     Comments: Right wrist in a Velcro wrist brace.   Skin:    General: Skin is warm and dry.  Neurological:     Mental Status: She is alert. She is disoriented.     Comments: Equal strength in all 4 extremities.      ED Results / Procedures / Treatments   Labs (all labs ordered are listed, but only abnormal results are displayed) Labs Reviewed  COMPREHENSIVE METABOLIC PANEL - Abnormal; Notable for the following components:      Result Value   Potassium 2.7 (*)    Glucose, Bld 137 (*)    Calcium 8.0 (*)    Total Protein 5.8 (*)    Albumin 2.6 (*)     All other components within normal limits  CBC WITH DIFFERENTIAL/PLATELET - Abnormal; Notable for the following components:   RBC 3.74 (*)    Hemoglobin 11.4 (*)    HCT 35.4 (*)    Neutro Abs 7.8 (*)    Monocytes Absolute 1.3 (*)    All other components within normal limits  URINALYSIS, W/ REFLEX TO CULTURE (INFECTION SUSPECTED) - Abnormal; Notable for the following components:   APPearance HAZY (*)    Leukocytes,Ua LARGE (*)    Bacteria, UA RARE (*)    All other components within normal limits  SARS CORONAVIRUS 2 BY RT PCR  URINE CULTURE  MAGNESIUM    EKG EKG Interpretation  Date/Time:  Wednesday Apr 17 2023 10:04:19 EDT Ventricular Rate:  51 PR Interval:  269 QRS Duration: 79 QT Interval:  576 QTC Calculation: 531 R Axis:   26 Text Interpretation: Sinus rhythm Prolonged PR interval Nonspecific T abnormalities, lateral leads Prolonged QT interval Confirmed by Pricilla Loveless [phone]) on 04/17/2023 11:24:22 AM  Radiology DG Chest Portable 1 View  Result Date: 04/17/2023 CLINICAL DATA:  Headache and hypotension EXAM: PORTABLE CHEST 1 VIEW COMPARISON:  Chest radiograph dated 01/15/2023 FINDINGS: Normal lung volumes. No focal consolidations. No pleural effusion or pneumothorax. The heart size and mediastinal contours are within normal limits. Aortic atherosclerosis. No acute osseous abnormality. IMPRESSION: 1. No active disease. 2.  Aortic Atherosclerosis (ICD10-I70.0). Electronically Signed   By: Agustin Cree M.D.   On: 04/17/2023 10:53   CT Head Wo Contrast  Result Date: 04/17/2023 CLINICAL DATA:  Headache. EXAM: CT HEAD WITHOUT CONTRAST TECHNIQUE: Contiguous axial images were obtained from the base of the skull through the vertex without intravenous contrast. RADIATION DOSE REDUCTION: This exam was performed according to the departmental dose-optimization program which includes automated exposure control, adjustment of the mA and/or kV according to patient size and/or use of iterative  reconstruction technique. COMPARISON:  07/02/2022 FINDINGS: Brain: No evidence of acute infarction, hemorrhage, hydrocephalus, extra-axial collection or mass lesion/mass effect. Prominence of the sulci and ventricles. There is mild diffuse low-attenuation within the subcortical and periventricular white matter compatible with chronic microvascular disease. Vascular: No hyperdense vessel or unexpected calcification. Skull: Normal. Negative for fracture or focal lesion. Sinuses/Orbits: No acute abnormality Other: None IMPRESSION: 1. No acute intracranial abnormalities. 2. Chronic microvascular disease and atrophy. Electronically Signed   By: Signa Kell M.D.   On: 04/17/2023 10:48    Procedures Procedures    Medications Ordered in ED Medications  cefTRIAXone (ROCEPHIN) 1 g in sodium chloride 0.9 % 100 mL IVPB (  1 g Intravenous New Bag/Given 04/17/23 1401)  lactated ringers bolus 500 mL (0 mLs Intravenous Stopped 04/17/23 1224)  potassium chloride SA (KLOR-CON M) CR tablet 40 mEq (40 mEq Oral Given 04/17/23 1120)  magnesium sulfate IVPB 2 g 50 mL (0 g Intravenous Stopped 04/17/23 1250)  potassium chloride 10 mEq in 100 mL IVPB (0 mEq Intravenous Stopped 04/17/23 1358)    ED Course/ Medical Decision Making/ A&P                             Medical Decision Making Amount and/or Complexity of Data Reviewed Labs: ordered.    Details: Moderate hypokalemia at 2.7.  Normal magnesium. Radiology: ordered and independent interpretation performed.    Details: No head bleed or pneumonia. ECG/medicine tests: ordered and independent interpretation performed.    Details: Prolonged QTc.  Risk Prescription drug management. Decision regarding hospitalization.   Patient overall has no complaints. Has some soft BPs, given some fluids. No clear infection (at first), though has a prolonged QTc potassium of 2.7.  She was given oral and IV potassium.  Also given IV magnesium.  I think she will need to be observed to  correct these electrolyte disturbances.  I tried to call the nursing facility but no one answered and I tried to call the son but he did not answer.  Further history is limited.  I discussed with Dr. Jarvis Newcomer for admission.  After patient was admitted, her urinalysis came back and shows some UTI and so she was put on IV antibiotics.        Final Clinical Impression(s) / ED Diagnoses Final diagnoses:  Hypokalemia    Rx / DC Orders ED Discharge Orders     None         Pricilla Loveless, MD 04/17/23 1513

## 2023-04-17 NOTE — ED Notes (Signed)
Pt placed on bair hugger by staff.

## 2023-04-17 NOTE — ED Triage Notes (Signed)
[facility] reports pt coming from [facility] unit for c/o headache. Upon EMS arrival pt BP 78/50 after 500NS came up to 90/46.

## 2023-04-17 NOTE — ED Notes (Signed)
Date and time results received: 04/17/23 1046 (use smartphrase ".now" to insert current time)  Test: potassium Critical Value: 2.7  Name of Provider Notified: Dr Criss Alvine  Orders Received? Or Actions Taken?: Orders Received - See Orders for details

## 2023-04-17 NOTE — H&P (Signed)
History and Physical    Patient: CHIRSTY [name] ZOX:[id number] DOB: 22-Oct-1932 DOA: 04/17/2023 DOS: the patient was seen and examined on 04/17/2023 PCP: Galvin Proffer, MD  Patient coming from:  [facility]  Chief Complaint:  Chief Complaint  Patient presents with   Hypotension   HPI: April CIOLEK is an 89 y.o. female with a history of dementia, HTN, depression, hypothyroidism, and bradycardia who presented to the ED from memory care after reportedly complaining of a headache this morning, was found to be hypotensive. Reported BP on arrival was 78/50, rising to 90/46 after 500cc NS. In the ED she has had no complaints. HR in 40-50's with BP 122/48 initially, trending to 91/51 with temperature 96.86F rectally measured. ECG showed sinus bradycardia with 1st degree AV block similar to priors. K 2.7, WBC 10.1k. Urinalysis has resulted with pyuria.   An additional 500cc LR was given as well as oral and IV potassium supplementation, and admission was requested.   History is limited to discussion with EDP and review of the medical record due to the patient's memory impairment. We attempted to contact the facility and the patient's Son without success. The patient is alert and interactive without any complaints whatsoever currently. On ROS the only positive is that she does feel lightheaded sometimes, though not particularly right now or today. She denies any further headache. She denies urinary complaints or abdominal pain, nausea or vomiting, just doesn't have much appetite.   Review of Systems: As mentioned in the history of present illness. All other systems reviewed and are negative. Past Medical History:  Diagnosis Date   Alzheimer disease (HCC)    Arthritis    Cognitive changes    Hyperlipidemia    Hypertension    Thyroid disease    Past Surgical History:  Procedure Laterality Date   FOOT SURGERY     Arthritis    HAND SURGERY     Arthritis    thumb surgery Left     [facility]    VAGINAL HYSTERECTOMY     Social History:  reports that she has quit smoking. She has never used smokeless tobacco. She reports that she does not currently use alcohol after a past usage of about 1.0 standard drink of alcohol per week. She reports that she does not use drugs.  Allergies  Allergen Reactions   Acid Reducer [Cimetidine] Nausea Only   Mevacor [Lovastatin] Nausea Only    Family History  Problem Relation Age of Onset   Pneumonia Father    Alcohol abuse Father    Dementia Sister     Prior to Admission medications   Medication Sig Start Date End Date Taking? Authorizing Provider  amLODipine (NORVASC) 5 MG tablet Take 5 mg by mouth daily.    [provider]  aspirin 81 MG tablet Take 1 tablet (81 mg total) by mouth daily with breakfast. 11/25/22   Emokpae, Courage, MD  atorvastatin (LIPITOR) 20 MG tablet Take 20 mg by mouth daily. 11/20/22   [provider]  donepezil (ARICEPT) 10 MG tablet Take 1 tablet (10 mg total) by mouth at bedtime. 01/09/22   Daphine Deutscher, [name], FNP  Ferrous Sulfate (IRON) 325 (65 Fe) MG TABS Take 1 tablet by mouth daily. 12/11/22   [provider]  folic acid (FOLVITE) 1 MG tablet Take 1 mg by mouth daily. 01/15/23   [provider]  levothyroxine (SYNTHROID) 75 MCG tablet Take 1 tablet (75 mcg total) by mouth daily  before breakfast. 01/09/22   Daphine Deutscher, [name], FNP  lisinopril (ZESTRIL) 20 MG tablet Take 1 tablet (20 mg total) by mouth daily. 11/25/22   Shon Hale, MD  melatonin 3 MG TABS tablet Take 3 mg by mouth at bedtime.    [provider]  memantine (NAMENDA XR) 21 MG CP24 24 hr capsule TAKE (1) CAPSULE DAILY 01/09/22   Daphine Deutscher, [name], FNP  QUEtiapine (SEROQUEL) 25 MG tablet Take 1 tablet (25 mg total) by mouth at bedtime. Patient taking differently: Take 25 mg by mouth 2 (two) times daily. 11/25/22   Shon Hale, MD  sertraline (ZOLOFT) 50 MG tablet Take 1  tablet (50 mg total) by mouth daily. 01/09/22   Bennie Pierini, FNP    Physical Exam: Vitals:   04/17/23 1130 04/17/23 1200 04/17/23 1214 04/17/23 1230  BP: 100/67 108/65  (!) 91/51  Pulse: (!) 45 (!) 46  (!) 43  Resp: 16 19  17   Temp:   (!) 96.4 F (35.8 C)   TempSrc:   Rectal   SpO2: 97% 90%  93%  Gen: Elderly female in no distress Eyes: +arcus senilis Pulm: Clear, nonlabored  CV: Regular bradycardia, no MRG or JVD. No pitting edema. GI: Soft, +BS, ND. +tenderness with suprapubic palpation.  Neuro: Alert and incompletely oriented but cooperative. No new focal deficits. Ext: Warm, no deformities. Skin: +varicosities bilateral LE's. No rashes, lesions or ulcers on visualized skin   Data Reviewed: WBC 10.1k, hgb 11.4g/dl K 2.7, Mg 1.8. SCr 0.73, BUN 16, bicarb 27. LFTs wnl. Albumin 2.6.  CXR (personal interpretation): Clear UA: +11-20WBCs +bacteria, no RBCs.  SARS-CoV-2 PCR negative WCG (personal interpretation): Sinus bradycardia with 1st deg AVB, prolonged QTc 531 msec.   Assessment and Plan: Hypotension: Limited po intake, taking antihypertensives, UTI, and low-normal baseline contributing. No recent steroids or history of adrenal insufficiency. - Hold home norvasc, lisinopril (probably received this AM) - Given 1L IVF, will start diet and monitor BP closely - AM cortisol, recheck TSH   UTI: +suprapubic tenderness, WBCs and bacteria on clean catch.  - Start CTX 1g IV q24h now, monitor urine culture.   Sinus bradycardia, 1st degree AVB: Chronic. Cardiology, Dr. Antoine Poche note from 02/12/23 stated ECG was unchanged, no significant structural heart disease or LV systolic dysfunction, and ambulatory cardiac monitoring had showed 1st degree AV block and 42 short (4-13 beats) runs of PSVT noted. - Given her ongoing lightheadedness when standing, will opt to DC aricept. Not on negative chronotropic agents otherwise. BP at that appointment was 116/50.   Prolonged QT  interval:  - Avoid provocative agents. Follow on telemetry  Headache: Resolved. CT head nonacute with stable chronic atrophy, microvascular disease.  - Tylenol prn  Hypothyroidism:  - Recheck TSH (0.477 Dec 2023) - Continue synthroid  Alzheimer's dementia:  - Hold aricept, continue namenda - Delirium precautions - Return to memory care unit at DC  GAD/depression, insomnia:  - Continue SSRI, seroquel, melatonin   Advance Care Planning: Full code (presumed)  Consults: None  Family Communication: Went to voicemail  Severity of Illness: The appropriate patient status for this patient is OBSERVATION. Observation status is judged to be reasonable and necessary in order to provide the required intensity of service to ensure the patient's safety. The patient's presenting symptoms, physical exam findings, and initial radiographic and laboratory data in the context of their medical condition is felt to place them at decreased risk for further clinical deterioration. Furthermore, it is anticipated that the patient will be medically  stable for discharge from the hospital within 2 midnights of admission.   Author: Tyrone Nine, MD 04/17/2023 1:01 PM  For on call review [email or web address].

## 2023-04-17 NOTE — ED Notes (Signed)
ED TO INPATIENT HANDOFF REPORT  ED Nurse Name and Phone #: Delice Bison, RN  S Name/Age/Gender April [name] 89 y.o. female Room/Bed: APA14/APA14  Code Status   Code Status: Full Code  Home/SNF/Other Skilled nursing facility Patient oriented to: self Is this baseline? Yes   Triage Complete: Triage complete  Chief Complaint Hypotension [I95.9]  Triage Note [facility] reports pt coming from [facility] unit for c/o headache. Upon EMS arrival pt BP 78/50 after 500NS came up to 90/46.    Allergies Allergies  Allergen Reactions   Acid Reducer [Cimetidine] Nausea Only   Mevacor [Lovastatin] Nausea Only    Level of Care/Admitting Diagnosis ED Disposition     ED Disposition  Admit   Condition  --   Comment  Hospital Area: [facility] [[id number]]  Level of Care: Telemetry [5]  Covid Evaluation: Confirmed COVID Negative  Diagnosis: Hypotension [604540]  Admitting Physician: Tyrone Nine [[id number]]  Attending Physician: Tyrone Nine [phone]          B Medical/Surgery History Past Medical History:  Diagnosis Date   Alzheimer disease (HCC)    Arthritis    Cognitive changes    Hyperlipidemia    Hypertension    Thyroid disease    Past Surgical History:  Procedure Laterality Date   FOOT SURGERY     Arthritis    HAND SURGERY     Arthritis    thumb surgery Left    [facility]    VAGINAL HYSTERECTOMY       A IV Location/Drains/Wounds Patient Lines/Drains/Airways Status     Active Line/Drains/Airways     Name Placement date Placement time Site Days   Peripheral IV 04/17/23 20 G 1" Left Antecubital 04/17/23  0953  Antecubital  less than 1            Intake/Output Last 24 hours No intake or output data in the 24 hours ending 04/17/23 1520  Labs/Imaging Results for orders placed or performed during the hospital encounter of 04/17/23 (from the past 48 hour(s))  Comprehensive metabolic panel     Status: Abnormal   Collection  Time: 04/17/23 10:00 AM  Result Value Ref Range   Sodium 137 135 - 145 mmol/L   Potassium 2.7 (LL) 3.5 - 5.1 mmol/L    Comment: CRITICAL RESULT CALLED TO, READ BACK BY AND VERIFIED WITH EDWARDS,C AT 10:45AM ON 04/17/23 BY FESTERMAN,C   Chloride 101 98 - 111 mmol/L   CO2 27 22 - 32 mmol/L   Glucose, Bld 137 (H) 70 - 99 mg/dL    Comment: Glucose reference range applies only to samples taken after fasting for at least 8 hours.   BUN 16 8 - 23 mg/dL   Creatinine, Ser 8.29 0.44 - 1.00 mg/dL   Calcium 8.0 (L) 8.9 - 10.3 mg/dL   Total Protein 5.8 (L) 6.5 - 8.1 g/dL   Albumin 2.6 (L) 3.5 - 5.0 g/dL   AST 22 15 - 41 U/L   ALT 13 0 - 44 U/L   Alkaline Phosphatase 66 38 - 126 U/L   Total Bilirubin 0.8 0.3 - 1.2 mg/dL   GFR, Estimated >56 >21 mL/min    Comment: (NOTE) Calculated using the CKD-EPI Creatinine Equation (2021)    Anion gap 9 5 - 15    Comment: Performed at [facility], [address], [place], [place] [address]  CBC with Differential     Status: Abnormal   Collection Time: 04/17/23 10:00 AM  Result Value Ref Range   WBC 10.1 4.0 - 10.5 K/uL   RBC 3.74 (L) 3.87 - 5.11 MIL/uL   Hemoglobin 11.4 (L) 12.0 - 15.0 g/dL   HCT 16.1 (L) 09.6 - 04.5 %   MCV 94.7 80.0 - 100.0 fL   MCH 30.5 26.0 - 34.0 pg   MCHC 32.2 30.0 - 36.0 g/dL   RDW 40.9 81.1 - 91.4 %   Platelets 282 150 - 400 K/uL   nRBC 0.0 0.0 - 0.2 %   Neutrophils Relative % 78 %   Neutro Abs 7.8 (H) 1.7 - 7.7 K/uL   Lymphocytes Relative 8 %   Lymphs Abs 0.9 0.7 - 4.0 K/uL   Monocytes Relative 13 %   Monocytes Absolute 1.3 (H) 0.1 - 1.0 K/uL   Eosinophils Relative 0 %   Eosinophils Absolute 0.0 0.0 - 0.5 K/uL   Basophils Relative 0 %   Basophils Absolute 0.0 0.0 - 0.1 K/uL   Immature Granulocytes 1 %   Abs Immature Granulocytes 0.06 0.00 - 0.07 K/uL    Comment: Performed at [facility], [address], [place], [place] [address]  Magnesium     Status: None   Collection Time: 04/17/23 10:00 AM  Result Value Ref Range    Magnesium 1.8 1.7 - 2.4 mg/dL    Comment: Performed at [facility], [address] [place], [place] [address]  SARS Coronavirus 2 by RT PCR (hospital order, performed in [facility] hospital lab) *cepheid single result test* Anterior Nasal Swab     Status: None   Collection Time: 04/17/23 10:52 AM   Specimen: Anterior Nasal Swab  Result Value Ref Range   SARS Coronavirus 2 by RT PCR NEGATIVE NEGATIVE    Comment: (NOTE) SARS-CoV-2 target nucleic acids are NOT DETECTED.  The SARS-CoV-2 RNA is generally detectable in upper and lower respiratory specimens during the acute phase of infection. The lowest concentration of SARS-CoV-2 viral copies this assay can detect is 250 copies / mL. A negative result does not preclude SARS-CoV-2 infection and should not be used as the sole basis for treatment or other patient management decisions.  A negative result may occur with improper specimen collection / handling, submission of specimen other than nasopharyngeal swab, presence of viral mutation(s) within the areas targeted by this assay, and inadequate number of viral copies (<250 copies / mL). A negative result must be combined with clinical observations, patient history, and epidemiological information.  Fact Sheet for Patients:   RoadLapTop.co.za  Fact Sheet for Healthcare Providers: http://kim-miller.com/  This test is not yet approved or  cleared by the [place] FDA and has been authorized for detection and/or diagnosis of SARS-CoV-2 by FDA under an Emergency Use Authorization (EUA).  This EUA will remain in effect (meaning this test can be used) for the duration of the COVID-19 declaration under Section 564(b)(1) of the Act, 21 U.S.C. section 360bbb-3(b)(1), unless the authorization is terminated or revoked sooner.  Performed at [facility], [address] [place], [place] [address]   Urinalysis, w/ Reflex to Culture (Infection  Suspected) -Urine, Clean Catch     Status: Abnormal   Collection Time: 04/17/23 12:45 PM  Result Value Ref Range   Specimen Source URINE, CLEAN CATCH    Color, Urine YELLOW YELLOW   APPearance HAZY (A) CLEAR   Specific Gravity, Urine 1.012 1.005 - 1.030   pH 6.0 5.0 - 8.0   Glucose, UA NEGATIVE NEGATIVE mg/dL   Hgb urine dipstick NEGATIVE NEGATIVE  Bilirubin Urine NEGATIVE NEGATIVE   Ketones, ur NEGATIVE NEGATIVE mg/dL   Protein, ur NEGATIVE NEGATIVE mg/dL   Nitrite NEGATIVE NEGATIVE   Leukocytes,Ua LARGE (A) NEGATIVE   RBC / HPF 0-5 0 - 5 RBC/hpf   WBC, UA 11-20 0 - 5 WBC/hpf    Comment:        Reflex urine culture not performed if WBC <=10, OR if Squamous epithelial cells >5. If Squamous epithelial cells >5 suggest recollection.    Bacteria, UA RARE (A) NONE SEEN   Squamous Epithelial / HPF 0-5 0 - 5 /HPF   Mucus PRESENT     Comment: Performed at [facility], [address], [place], [place] [address]   DG Chest Portable 1 View  Result Date: 04/17/2023 CLINICAL DATA:  Headache and hypotension EXAM: PORTABLE CHEST 1 VIEW COMPARISON:  Chest radiograph dated 01/15/2023 FINDINGS: Normal lung volumes. No focal consolidations. No pleural effusion or pneumothorax. The heart size and mediastinal contours are within normal limits. Aortic atherosclerosis. No acute osseous abnormality. IMPRESSION: 1. No active disease. 2.  Aortic Atherosclerosis (ICD10-I70.0). Electronically Signed   By: Agustin Cree M.D.   On: 04/17/2023 10:53   CT Head Wo Contrast  Result Date: 04/17/2023 CLINICAL DATA:  Headache. EXAM: CT HEAD WITHOUT CONTRAST TECHNIQUE: Contiguous axial images were obtained from the base of the skull through the vertex without intravenous contrast. RADIATION DOSE REDUCTION: This exam was performed according to the departmental dose-optimization program which includes automated exposure control, adjustment of the mA and/or kV according to patient size and/or use of iterative reconstruction  technique. COMPARISON:  07/02/2022 FINDINGS: Brain: No evidence of acute infarction, hemorrhage, hydrocephalus, extra-axial collection or mass lesion/mass effect. Prominence of the sulci and ventricles. There is mild diffuse low-attenuation within the subcortical and periventricular white matter compatible with chronic microvascular disease. Vascular: No hyperdense vessel or unexpected calcification. Skull: Normal. Negative for fracture or focal lesion. Sinuses/Orbits: No acute abnormality Other: None IMPRESSION: 1. No acute intracranial abnormalities. 2. Chronic microvascular disease and atrophy. Electronically Signed   By: Signa Kell M.D.   On: 04/17/2023 10:48    Pending Labs Unresulted Labs (From admission, onward)     Start     Ordered   04/17/23 1245  Urine Culture  Once,   R        04/17/23 1245   Signed and Held  Basic metabolic panel  Tomorrow morning,   R        Signed and Held   Signed and Held  TSH  Tomorrow morning,   R        Signed and Held   Signed and Held  Cortisol  Once-Timed,   R        Signed and Held            Vitals/Pain Today's Vitals   04/17/23 1320 04/17/23 1330 04/17/23 1400 04/17/23 1436  BP:  (!) 96/45 (!) 98/52   Pulse:  (!) 46 (!) 48   Resp:  11 16   Temp: (!) 96.6 F (35.9 C)   97.9 F (36.6 C)  TempSrc: Rectal   Rectal  SpO2:  91% 93%   PainSc:        Isolation Precautions No active isolations  Medications Medications  cefTRIAXone (ROCEPHIN) 1 g in sodium chloride 0.9 % 100 mL IVPB (1 g Intravenous New Bag/Given 04/17/23 1401)  lactated ringers bolus 500 mL (0 mLs Intravenous Stopped 04/17/23 1224)  potassium chloride SA (KLOR-CON M) CR tablet 40  mEq (40 mEq Oral Given 04/17/23 1120)  magnesium sulfate IVPB 2 g 50 mL (0 g Intravenous Stopped 04/17/23 1250)  potassium chloride 10 mEq in 100 mL IVPB (0 mEq Intravenous Stopped 04/17/23 1358)    Mobility walks with device     Focused Assessments Cardiac Assessment Handoff:    No results  found for: "CKTOTAL", "CKMB", "CKMBINDEX", "TROPONINI" No results found for: "DDIMER" Does the Patient currently have chest pain? No    R Recommendations: See Admitting Provider Note  Report given to:   Additional Notes:

## 2023-04-17 NOTE — ED Notes (Signed)
Bair hugger removed  

## 2023-04-18 DIAGNOSIS — E785 Hyperlipidemia, unspecified: Secondary | ICD-10-CM | POA: Diagnosis not present

## 2023-04-18 DIAGNOSIS — I959 Hypotension, unspecified: Secondary | ICD-10-CM | POA: Diagnosis not present

## 2023-04-18 LAB — URINE CULTURE

## 2023-04-18 LAB — BASIC METABOLIC PANEL
Anion gap: 8 (ref 5–15)
BUN: 19 mg/dL (ref 8–23)
CO2: 27 mmol/L (ref 22–32)
Calcium: 8.4 mg/dL — ABNORMAL LOW (ref 8.9–10.3)
Chloride: 104 mmol/L (ref 98–111)
Creatinine, Ser: 0.61 mg/dL (ref 0.44–1.00)
GFR, Estimated: >60 mL/min (ref >60)
Glucose, Bld: 97 mg/dL (ref 70–99)
Potassium: 3.6 mmol/L (ref 3.5–5.1)
Sodium: 139 mmol/L (ref 135–145)

## 2023-04-18 LAB — TSH: TSH: 2.647 u[IU]/mL (ref 0.350–4.500)

## 2023-04-18 LAB — CORTISOL: Cortisol, Plasma: 24.9 ug/dL

## 2023-04-18 MED ORDER — CEPHALEXIN 500 MG PO CAPS: 500.0000 mg | ORAL_CAPSULE | Freq: Two times a day (BID) | ORAL | 0 refills | Status: AC

## 2023-04-18 NOTE — TOC Transition Note (Signed)
Transition of Care [facility]) - CM/SW Discharge Note   Patient Details  Name: April [name] MRN: [id number] Date of Birth: 1932-01-30  [facility] CM/SW Contact:  Villa Herb, LCSWA Phone Number: 04/18/2023, 11:52 AM   Clinical Narrative:    Pt arrived from [facility] (memory care). CSW spoke to Cammie Sickle who states pt can return and they will need updated Fl2 and D/C summary. CSW faxed clinicals to facility. CSW confirmed with ALF they are ready to accept pt. They do not have transportation today but feel [facility] would be best. CSW updated RN. CSW left VM for pts son requesting call back for update on pts hospital D/C. CSW to call for [facility]. TOC signing off.   Final next level of care: Memory Care Barriers to Discharge: Barriers Resolved   Patient Goals and CMS Choice      Discharge Placement                  Patient to be transferred to facility by: [facility] Name of family member notified: son Patient and family notified of of transfer: 04/18/23  Discharge Plan and Services Additional resources added to the After Visit Summary for                                       Social Determinants of Health (SDOH) Interventions SDOH Screenings   Food Insecurity: No Food Insecurity (10/31/2021)  Housing: Low Risk  (10/31/2021)  Transportation Needs: No Transportation Needs (10/31/2021)  Alcohol Screen: Low Risk  (10/31/2021)  Depression (PHQ2-9): Low Risk  (02/28/2022)  Financial Resource Strain: Low Risk  (10/31/2021)  Physical Activity: Inactive (10/31/2021)  Social Connections: Moderately Isolated (10/31/2021)  Stress: No Stress Concern Present (10/31/2021)  Tobacco Use: Medium Risk (04/17/2023)     Readmission Risk Interventions     No data to display

## 2023-04-18 NOTE — Discharge Summary (Signed)
Physician Discharge Summary   Patient: April [name] MRN: [id number] DOB: 01/15/1932  Admit date:     04/17/2023  Discharge date: 04/18/23  Discharge Physician: Tyrone Nine   PCP: Galvin Proffer, MD   Recommendations at discharge:  Monitor BP and HR closely. Recommend stopping norvasc for now with soft BP. Would also recommend stopping aricept with ongoing symptoms of orthostatic symptoms and significant sinus bradycardia and 1st degree AV block.  Patient has cardiology follow up scheduled 5/29 with Dr. Jenene Slicker.  Follow up urine culture data (pending at time of discharge), discharged on keflex 500mg  BID for UTI.   Discharge Diagnoses: Principal Problem:   Hypotension Active Problems:   UTI (urinary tract infection)   Sinus bradycardia   Hyperlipidemia with target LDL less than 100   Hypothyroidism   Hypertension   GAD (generalized anxiety disorder)   Late onset Alzheimer's disease without behavioral disturbance [facility])  Hospital Course: HPI: April [name] is an 89 y.o. female with a history of dementia, HTN, depression, hypothyroidism, and bradycardia who presented to the ED from memory care after reportedly complaining of a headache this morning, was found to be hypotensive. Reported BP on arrival was 78/50, rising to 90/46 after 500cc NS. In the ED she has had no complaints. HR in 40-50's with BP 122/48 initially, trending to 91/51 with temperature 96.51F rectally measured. ECG showed sinus bradycardia with 1st degree AV block similar to priors. K 2.7, WBC 10.1k. Urinalysis has resulted with pyuria.    An additional 500cc LR was given as well as oral and IV potassium supplementation, and admission was requested.    History is limited to discussion with EDP and review of the medical record due to the patient's memory impairment. We attempted to contact the facility and the patient's Son without success. The patient is alert and interactive without any complaints whatsoever currently.  On ROS the only positive is that she does feel lightheaded sometimes, though not particularly right now or today. She denies any further headache. She denies urinary complaints or abdominal pain, nausea or vomiting, just doesn't have much appetite.   Assessment and Plan: Hypotension: Limited po intake, taking antihypertensives, UTI, and low-normal baseline contributing. No recent steroids or history of adrenal insufficiency, cortisol 24.9, TSH 2.647.  - Hold home norvasc. Ok to restart lisinopril if BP stable 5/10. BP 136/60 at discharge on 5/9 off all medications.   UTI: +suprapubic tenderness, WBCs and bacteria on clean catch.  - Started CTX 1g IV q24h. Pt has remained without fever or leukocytosis, do not suspect sepsis. Culture still pending. Only prior culture in EMR showed multiple spp. Will plan to complete course with keflex 500mg  po BID to complete 7 days.   Sinus bradycardia, 1st degree AVB: Chronic. Cardiology, Dr. Antoine Poche note from 02/12/23 stated ECG was unchanged, no significant structural heart disease or LV systolic dysfunction, and ambulatory cardiac monitoring had showed 1st degree AV block and 42 short (4-13 beats) runs of PSVT noted. BP at that appointment was 116/50.  - Given her ongoing lightheadedness when standing, will opt to DC aricept for now. Not on negative chronotropic agents otherwise.    Prolonged QT interval:  - Avoid provocative agents.     Headache: Resolved. CT head nonacute with stable chronic atrophy, microvascular disease.  - Tylenol prn   Hypothyroidism: TSH wnl. - Continue synthroid   Alzheimer's dementia:  - Hold aricept, continue namenda - Delirium precautions - Return to memory care unit at DC  GAD/depression, insomnia:  - Continue SSRI, seroquel, melatonin  Consultants: None Procedures performed: None  Disposition: Long term care facility / Memory Care Diet recommendation:  Cardiac diet DISCHARGE MEDICATION: Allergies as of 04/18/2023        Reactions   Acid Reducer [cimetidine] Nausea Only   Mevacor [lovastatin] Nausea Only        Medication List     STOP taking these medications    amLODipine 5 MG tablet Commonly known as: NORVASC   donepezil 10 MG tablet Commonly known as: ARICEPT   donepezil 5 MG tablet Commonly known as: ARICEPT       TAKE these medications    aspirin 81 MG tablet Take 1 tablet (81 mg total) by mouth daily with breakfast.   atorvastatin 20 MG tablet Commonly known as: LIPITOR Take 20 mg by mouth daily.   cephALEXin 500 MG capsule Commonly known as: KEFLEX Take 1 capsule (500 mg total) by mouth 2 (two) times daily for 6 days.   folic acid 1 MG tablet Commonly known as: FOLVITE Take 1 mg by mouth daily.   Iron 325 (65 Fe) MG Tabs Take 1 tablet by mouth daily.   levothyroxine 75 MCG tablet Commonly known as: SYNTHROID Take 1 tablet (75 mcg total) by mouth daily before breakfast.   lisinopril 20 MG tablet Commonly known as: ZESTRIL Take 1 tablet (20 mg total) by mouth daily.   melatonin 3 MG Tabs tablet Take 3 mg by mouth at bedtime.   memantine 21 MG Cp24 24 hr capsule Commonly known as: NAMENDA XR TAKE (1) CAPSULE DAILY   mirtazapine 7.5 MG tablet Commonly known as: REMERON Take 7.5 mg by mouth at bedtime.   QUEtiapine 25 MG tablet Commonly known as: SEROquel Take 1 tablet (25 mg total) by mouth at bedtime. What changed: when to take this   sertraline 50 MG tablet Commonly known as: ZOLOFT Take 1 tablet (50 mg total) by mouth daily.        Follow-up Information     Hague, Myrene Galas, MD Follow up.   Specialty: Internal Medicine Contact information: [address] [place] [address] [phone]         Marjo Bicker, MD Follow up on 05/08/2023.   Specialties: Cardiology, Internal Medicine Contact information: [address] [place] [address] [phone]                Discharge Exam: Ceasar Mons Weights   04/17/23  1607  Weight: 49.1 kg  BP 136/60 (BP Location: Right Arm)   Pulse (!) 54   Temp 97.7 F (36.5 C)   Resp 18   Ht 5\' 6"  (1.676 m)   Wt 49.1 kg   SpO2 95%   BMI 17.47 kg/m   Elderly female in no distress Clear, nonlabored,  Regular bradycardia Soft, NT, ND, +BS Alert, interactive, incompletely oriented.  Condition at discharge: stable  The results of significant diagnostics from this hospitalization (including imaging, microbiology, ancillary and laboratory) are listed below for reference.   Imaging Studies: DG Chest Portable 1 View  Result Date: 04/17/2023 CLINICAL DATA:  Headache and hypotension EXAM: PORTABLE CHEST 1 VIEW COMPARISON:  Chest radiograph dated 01/15/2023 FINDINGS: Normal lung volumes. No focal consolidations. No pleural effusion or pneumothorax. The heart size and mediastinal contours are within normal limits. Aortic atherosclerosis. No acute osseous abnormality. IMPRESSION: 1. No active disease. 2.  Aortic Atherosclerosis (ICD10-I70.0). Electronically Signed   By: Agustin Cree M.D.   On: 04/17/2023  10:53   CT Head Wo Contrast  Result Date: 04/17/2023 CLINICAL DATA:  Headache. EXAM: CT HEAD WITHOUT CONTRAST TECHNIQUE: Contiguous axial images were obtained from the base of the skull through the vertex without intravenous contrast. RADIATION DOSE REDUCTION: This exam was performed according to the departmental dose-optimization program which includes automated exposure control, adjustment of the mA and/or kV according to patient size and/or use of iterative reconstruction technique. COMPARISON:  07/02/2022 FINDINGS: Brain: No evidence of acute infarction, hemorrhage, hydrocephalus, extra-axial collection or mass lesion/mass effect. Prominence of the sulci and ventricles. There is mild diffuse low-attenuation within the subcortical and periventricular white matter compatible with chronic microvascular disease. Vascular: No hyperdense vessel or unexpected calcification. Skull:  Normal. Negative for fracture or focal lesion. Sinuses/Orbits: No acute abnormality Other: None IMPRESSION: 1. No acute intracranial abnormalities. 2. Chronic microvascular disease and atrophy. Electronically Signed   By: Signa Kell M.D.   On: 04/17/2023 10:48   DG Wrist Complete Right  Result Date: 04/13/2023 CLINICAL DATA:  Fall.  Possible seizure.  Right wrist pain. EXAM: RIGHT WRIST - COMPLETE 3+ VIEW COMPARISON:  None Available. FINDINGS: No fracture. No bone lesion. Skeletal structures are diffusely demineralized. Joint space narrowing, mild subchondral sclerosis and small marginal osteophytes at the scaphoid trapezium trapezoid articulation and trapezium first metacarpal articulation. Milder joint space narrowing between the distal radius and scaphoid and lunate and capitate. Soft tissues are unremarkable. IMPRESSION: 1. No fracture or dislocation. 2. Degenerative changes as detailed. Electronically Signed   By: Amie Portland M.D.   On: 04/13/2023 13:17   DG Wrist Complete Left  Addendum Date: 04/13/2023   ADDENDUM REPORT: 04/13/2023 12:19 ADDENDUM: This examination is actually of the right wrist. Films are marked appropriately but the exam order is right left reversed. Electronically Signed   By: Paulina Fusi M.D.   On: 04/13/2023 12:19   Result Date: 04/13/2023 CLINICAL DATA:  Larey Seat with trauma to the hand and wrist EXAM: LEFT WRIST - COMPLETE 3+ VIEW COMPARISON:  None Available. FINDINGS: No acute fracture or dislocation. Chronic degenerative changes at the first carpometacarpal articulation. Chronic degenerative change of the radiocarpal joint. Chronic chondrocalcinosis. IMPRESSION: No acute or traumatic finding. Chronic degenerative changes as above. Electronically Signed: By: Paulina Fusi M.D. On: 04/13/2023 11:07    Microbiology: Results for orders placed or performed during the hospital encounter of 04/17/23  SARS Coronavirus 2 by RT PCR (hospital order, performed in [facility] hospital  lab) *cepheid single result test* Anterior Nasal Swab     Status: None   Collection Time: 04/17/23 10:52 AM   Specimen: Anterior Nasal Swab  Result Value Ref Range Status   SARS Coronavirus 2 by RT PCR NEGATIVE NEGATIVE Final    Comment: (NOTE) SARS-CoV-2 target nucleic acids are NOT DETECTED.  The SARS-CoV-2 RNA is generally detectable in upper and lower respiratory specimens during the acute phase of infection. The lowest concentration of SARS-CoV-2 viral copies this assay can detect is 250 copies / mL. A negative result does not preclude SARS-CoV-2 infection and should not be used as the sole basis for treatment or other patient management decisions.  A negative result may occur with improper specimen collection / handling, submission of specimen other than nasopharyngeal swab, presence of viral mutation(s) within the areas targeted by this assay, and inadequate number of viral copies (<250 copies / mL). A negative result must be combined with clinical observations, patient history, and epidemiological information.  Fact Sheet for Patients:   RoadLapTop.co.za  Fact  Sheet for Healthcare Providers: http://kim-miller.com/  This test is not yet approved or  cleared by the [place] and has been authorized for detection and/or diagnosis of SARS-CoV-2 by FDA under an Emergency Use Authorization (EUA).  This EUA will remain in effect (meaning this test can be used) for the duration of the COVID-19 declaration under Section 564(b)(1) of the Act, 21 U.S.C. section 360bbb-3(b)(1), unless the authorization is terminated or revoked sooner.  Performed at [facility], [address] [place], [place] [address]   Urine Culture     Status: None (Preliminary result)   Collection Time: 04/17/23 12:45 PM   Specimen: Urine, Clean Catch  Result Value Ref Range Status   Specimen Description   Final    URINE, CLEAN CATCH Performed at [facility], [address], [place], [place] [address]    Special Requests   Final    NONE Reflexed from [id number] Performed at [facility], [address], [place], [place] [address]    Culture PENDING  Incomplete   Report Status PENDING  Incomplete    Labs: CBC: Recent Labs  Lab 04/13/23 1039 04/17/23 1000  WBC 10.6* 10.1  NEUTROABS  --  7.8*  HGB 13.2 11.4*  HCT 39.6 35.4*  MCV 93.0 94.7  PLT 273 282   Basic Metabolic Panel: Recent Labs  Lab 04/13/23 1039 04/17/23 1000 04/18/23 0447  NA 139 137 139  K 3.5 2.7* 3.6  CL 102 101 104  CO2 29 27 27   GLUCOSE 105* 137* 97  BUN 14 16 19   CREATININE 0.65 0.73 0.61  CALCIUM 8.5* 8.0* 8.4*  MG  --  1.8  --    Liver Function Tests: Recent Labs  Lab 04/17/23 1000  AST 22  ALT 13  ALKPHOS 66  BILITOT 0.8  PROT 5.8*  ALBUMIN 2.6*   CBG: No results for input(s): "GLUCAP" in the last 168 hours.  Discharge time spent: greater than 30 minutes.  Signed: Tyrone Nine, MD [facility] 04/18/2023

## 2023-04-18 NOTE — Care Management Obs Status (Signed)
MEDICARE OBSERVATION STATUS NOTIFICATION   Patient Details  Name: April [name] MRN: [id number] Date of Birth: October 04, 1932   Medicare Observation Status Notification Given:  Yes    Corey Harold 04/18/2023, 12:37 PM

## 2023-04-18 NOTE — NC FL2 (Signed)
[place] MEDICAID FL2 LEVEL OF CARE FORM     IDENTIFICATION  Patient Name: April [name] Birthdate: [birth year] Sex: female Admission Date (Current Location): 04/17/2023  [facility] and [place] Number:  [employer] and Address:  [facility],  [address] [place] [address]      Provider Number: [id number]  Attending Physician Name and Address:  Tyrone Nine, MD  Relative Name and Phone Number:       Current Level of Care: Hospital Recommended Level of Care: Assisted Living Facility, Memory Care Prior Approval Number:    Date Approved/Denied:   PASRR Number:    Discharge Plan: Other (Comment) (Memory Care at Assisted Living)    Current Diagnoses: Patient Active Problem List   Diagnosis Date Noted   Hypotension 04/17/2023   Mitral regurgitation 02/12/2023   Tricuspid regurgitation 02/12/2023   UTI (urinary tract infection) 11/25/2022   Syncope and collapse 11/25/2022   Sinus bradycardia 11/25/2022   Closed dislocation of interphalangeal joint of left thumb 08/01/2020   Laceration of left thumb without foreign body without damage to nail 07/21/2020   Late onset Alzheimer's disease without behavioral disturbance (HCC) 02/24/2020   Insomnia 10/12/2014   Hyperlipidemia with target LDL less than 100 08/26/2013   Hypothyroidism 08/26/2013   Hypertension 08/26/2013   Diverticulosis 08/26/2013   GAD (generalized anxiety disorder) 08/26/2013   Neuropathy of both feet 08/26/2013    Orientation RESPIRATION BLADDER Height & Weight     Self  Normal Incontinent Weight: 108 lb 3.9 oz (49.1 kg) Height:  5\' 6"  (167.6 cm)  BEHAVIORAL SYMPTOMS/MOOD NEUROLOGICAL BOWEL NUTRITION STATUS      Continent Diet (Regular)  AMBULATORY STATUS COMMUNICATION OF NEEDS Skin   Limited Assist Verbally Normal                       Personal Care Assistance Level of Assistance  Bathing, Feeding, Dressing Bathing Assistance: Limited assistance Feeding assistance:  Limited assistance Dressing Assistance: Limited assistance     Functional Limitations Info  Sight, Hearing, Speech Sight Info: Adequate Hearing Info: Adequate Speech Info: Adequate    SPECIAL CARE FACTORS FREQUENCY                       Contractures Contractures Info: Not present    Additional Factors Info  Code Status, Allergies Code Status Info: FULL Allergies Info: Acid Reducer (cimetidine) and Mevacor (lovastatin)           Current Medications (04/18/2023):  This is the current hospital active medication list Current Facility-Administered Medications  Medication Dose Route Frequency Provider Last Rate Last Admin   acetaminophen (TYLENOL) tablet 650 mg  650 mg Oral Q6H PRN Tyrone Nine, MD       Or   acetaminophen (TYLENOL) suppository 650 mg  650 mg Rectal Q6H PRN Tyrone Nine, MD       cefTRIAXone (ROCEPHIN) 1 g in sodium chloride 0.9 % 100 mL IVPB  1 g Intravenous Q24H Tyrone Nine, MD   Stopped at 04/17/23 1520   enoxaparin (LOVENOX) injection 40 mg  40 mg Subcutaneous Q24H Tyrone Nine, MD   40 mg at 04/17/23 2109   levothyroxine (SYNTHROID) tablet 75 mcg  75 mcg Oral QAC breakfast Tyrone Nine, MD   75 mcg at 04/18/23 0610   melatonin tablet 3 mg  3 mg Oral QHS Tyrone Nine, MD   3 mg at 04/17/23 2110  memantine (NAMENDA XR) 24 hr capsule 21 mg  21 mg Oral Daily Hazeline Junker B, MD   21 mg at 04/18/23 0918   QUEtiapine (SEROQUEL) tablet 25 mg  25 mg Oral QHS Tyrone Nine, MD   25 mg at 04/17/23 2110   sertraline (ZOLOFT) tablet 50 mg  50 mg Oral Daily Tyrone Nine, MD   50 mg at 04/18/23 0918   sodium chloride flush (NS) 0.9 % injection 3 mL  3 mL Intravenous Q12H Tyrone Nine, MD   3 mL at 04/18/23 0919     Discharge Medications: aspirin 81 MG tablet Take 1 tablet (81 mg total) by mouth daily with breakfast.    atorvastatin 20 MG tablet Commonly known as: LIPITOR Take 20 mg by mouth daily.    cephALEXin 500 MG capsule Commonly known as:  KEFLEX Take 1 capsule (500 mg total) by mouth 2 (two) times daily for 6 days.    folic acid 1 MG tablet Commonly known as: FOLVITE Take 1 mg by mouth daily.    Iron 325 (65 Fe) MG Tabs Take 1 tablet by mouth daily.    levothyroxine 75 MCG tablet Commonly known as: SYNTHROID Take 1 tablet (75 mcg total) by mouth daily before breakfast.    lisinopril 20 MG tablet Commonly known as: ZESTRIL Take 1 tablet (20 mg total) by mouth daily.    melatonin 3 MG Tabs tablet Take 3 mg by mouth at bedtime.    memantine 21 MG Cp24 24 hr capsule Commonly known as: NAMENDA XR TAKE (1) CAPSULE DAILY    mirtazapine 7.5 MG tablet Commonly known as: REMERON Take 7.5 mg by mouth at bedtime.    QUEtiapine 25 MG tablet Commonly known as: SEROquel Take 1 tablet (25 mg total) by mouth at bedtime. What changed: when to take this    sertraline 50 MG tablet Commonly known as: ZOLOFT Take 1 tablet (50 mg total) by mouth daily.    Relevant Imaging Results:  Relevant Lab Results:   Additional Information SSN: [id number] [place]

## 2023-04-18 NOTE — Plan of Care (Signed)

## 2023-04-18 NOTE — Plan of Care (Signed)
Problem: Education: Goal: Knowledge of General Education information will improve Description: Including pain rating scale, medication(s)/side effects and non-pharmacologic comfort measures 04/18/2023 1030 by Sheela Stack, RN Outcome: Adequate for Discharge 04/18/2023 0744 by Sheela Stack, RN Outcome: Progressing   Problem: Health Behavior/Discharge Planning: Goal: Ability to manage health-related needs will improve 04/18/2023 1030 by Sheela Stack, RN Outcome: Adequate for Discharge 04/18/2023 0744 by Sheela Stack, RN Outcome: Progressing   Problem: Clinical Measurements: Goal: Ability to maintain clinical measurements within normal limits will improve 04/18/2023 1030 by Sheela Stack, RN Outcome: Adequate for Discharge 04/18/2023 0744 by Sheela Stack, RN Outcome: Progressing Goal: Will remain free from infection 04/18/2023 1030 by Sheela Stack, RN Outcome: Adequate for Discharge 04/18/2023 0744 by Sheela Stack, RN Outcome: Progressing Goal: Diagnostic test results will improve 04/18/2023 1030 by Sheela Stack, RN Outcome: Adequate for Discharge 04/18/2023 0744 by Sheela Stack, RN Outcome: Progressing Goal: Respiratory complications will improve 04/18/2023 1030 by Sheela Stack, RN Outcome: Adequate for Discharge 04/18/2023 0744 by Sheela Stack, RN Outcome: Progressing Goal: Cardiovascular complication will be avoided 04/18/2023 1030 by Sheela Stack, RN Outcome: Adequate for Discharge 04/18/2023 0744 by Sheela Stack, RN Outcome: Progressing   Problem: Activity: Goal: Risk for activity intolerance will decrease 04/18/2023 1030 by Sheela Stack, RN Outcome: Adequate for Discharge 04/18/2023 0744 by Sheela Stack, RN Outcome: Progressing   Problem: Nutrition: Goal: Adequate nutrition will be maintained 04/18/2023 1030 by Sheela Stack, RN Outcome: Adequate for Discharge 04/18/2023 0744 by Sheela Stack, RN Outcome: Progressing   Problem:  Coping: Goal: Level of anxiety will decrease 04/18/2023 1030 by Sheela Stack, RN Outcome: Adequate for Discharge 04/18/2023 0744 by Sheela Stack, RN Outcome: Progressing   Problem: Elimination: Goal: Will not experience complications related to bowel motility 04/18/2023 1030 by Sheela Stack, RN Outcome: Adequate for Discharge 04/18/2023 0744 by Sheela Stack, RN Outcome: Progressing Goal: Will not experience complications related to urinary retention 04/18/2023 1030 by Sheela Stack, RN Outcome: Adequate for Discharge 04/18/2023 0744 by Sheela Stack, RN Outcome: Progressing   Problem: Pain Managment: Goal: General experience of comfort will improve 04/18/2023 1030 by Sheela Stack, RN Outcome: Adequate for Discharge 04/18/2023 0744 by Sheela Stack, RN Outcome: Progressing   Problem: Safety: Goal: Ability to remain free from injury will improve 04/18/2023 1030 by Sheela Stack, RN Outcome: Adequate for Discharge 04/18/2023 0744 by Sheela Stack, RN Outcome: Progressing   Problem: Skin Integrity: Goal: Risk for impaired skin integrity will decrease 04/18/2023 1030 by Sheela Stack, RN Outcome: Adequate for Discharge 04/18/2023 0744 by Sheela Stack, RN Outcome: Progressing   Problem: Education: Goal: Knowledge of General Education information will improve Description: Including pain rating scale, medication(s)/side effects and non-pharmacologic comfort measures 04/18/2023 1030 by Sheela Stack, RN Outcome: Adequate for Discharge 04/18/2023 0744 by Sheela Stack, RN Outcome: Progressing   Problem: Health Behavior/Discharge Planning: Goal: Ability to manage health-related needs will improve 04/18/2023 1030 by Sheela Stack, RN Outcome: Adequate for Discharge 04/18/2023 0744 by Sheela Stack, RN Outcome: Progressing   Problem: Clinical Measurements: Goal: Ability to maintain clinical measurements within normal limits will improve 04/18/2023 1030 by Sheela Stack, RN Outcome: Adequate for Discharge 04/18/2023 0744 by Sheela Stack, RN Outcome: Progressing Goal: Will remain free from infection 04/18/2023 1030 by Sheela Stack, RN Outcome: Adequate for Discharge 04/18/2023 [phone] by  Sheela Stack, RN Outcome: Progressing Goal: Diagnostic test results will improve 04/18/2023 1030 by Sheela Stack, RN Outcome: Adequate for Discharge 04/18/2023 0744 by Sheela Stack, RN Outcome: Progressing Goal: Respiratory complications will improve 04/18/2023 1030 by Sheela Stack, RN Outcome: Adequate for Discharge 04/18/2023 0744 by Sheela Stack, RN Outcome: Progressing Goal: Cardiovascular complication will be avoided 04/18/2023 1030 by Sheela Stack, RN Outcome: Adequate for Discharge 04/18/2023 0744 by Sheela Stack, RN Outcome: Progressing   Problem: Activity: Goal: Risk for activity intolerance will decrease 04/18/2023 1030 by Sheela Stack, RN Outcome: Adequate for Discharge 04/18/2023 0744 by Sheela Stack, RN Outcome: Progressing   Problem: Nutrition: Goal: Adequate nutrition will be maintained 04/18/2023 1030 by Sheela Stack, RN Outcome: Adequate for Discharge 04/18/2023 0744 by Sheela Stack, RN Outcome: Progressing   Problem: Coping: Goal: Level of anxiety will decrease 04/18/2023 1030 by Sheela Stack, RN Outcome: Adequate for Discharge 04/18/2023 0744 by Sheela Stack, RN Outcome: Progressing   Problem: Elimination: Goal: Will not experience complications related to bowel motility 04/18/2023 1030 by Sheela Stack, RN Outcome: Adequate for Discharge 04/18/2023 0744 by Sheela Stack, RN Outcome: Progressing Goal: Will not experience complications related to urinary retention 04/18/2023 1030 by Sheela Stack, RN Outcome: Adequate for Discharge 04/18/2023 0744 by Sheela Stack, RN Outcome: Progressing   Problem: Pain Managment: Goal: General experience of comfort will improve 04/18/2023 1030 by Sheela Stack, RN Outcome:  Adequate for Discharge 04/18/2023 0744 by Sheela Stack, RN Outcome: Progressing   Problem: Safety: Goal: Ability to remain free from injury will improve 04/18/2023 1030 by Sheela Stack, RN Outcome: Adequate for Discharge 04/18/2023 0744 by Sheela Stack, RN Outcome: Progressing   Problem: Skin Integrity: Goal: Risk for impaired skin integrity will decrease 04/18/2023 1030 by Sheela Stack, RN Outcome: Adequate for Discharge 04/18/2023 0744 by Sheela Stack, RN Outcome: Progressing

## 2023-04-22 ENCOUNTER — Emergency Department (HOSPITAL_COMMUNITY): Payer: Medicare Other

## 2023-04-22 ENCOUNTER — Encounter (HOSPITAL_COMMUNITY): Payer: Self-pay

## 2023-04-22 ENCOUNTER — Emergency Department (HOSPITAL_COMMUNITY)
Admission: EM | Admit: 2023-04-22 | Discharge: 2023-04-22 | Disposition: A | Discharge status: Home / Self Care | Payer: Medicare Other | Attending: Emergency Medicine | Admitting: Emergency Medicine

## 2023-04-22 ENCOUNTER — Other Ambulatory Visit: Payer: Self-pay

## 2023-04-22 DIAGNOSIS — F0280 Dementia in other diseases classified elsewhere without behavioral disturbance: Secondary | ICD-10-CM | POA: Insufficient documentation

## 2023-04-22 DIAGNOSIS — D518 Other vitamin B12 deficiency anemias: Secondary | ICD-10-CM | POA: Diagnosis not present

## 2023-04-22 DIAGNOSIS — E782 Mixed hyperlipidemia: Secondary | ICD-10-CM | POA: Diagnosis not present

## 2023-04-22 DIAGNOSIS — R531 Weakness: Secondary | ICD-10-CM | POA: Insufficient documentation

## 2023-04-22 DIAGNOSIS — E119 Type 2 diabetes mellitus without complications: Secondary | ICD-10-CM | POA: Diagnosis not present

## 2023-04-22 DIAGNOSIS — N189 Chronic kidney disease, unspecified: Secondary | ICD-10-CM | POA: Diagnosis not present

## 2023-04-22 DIAGNOSIS — I10 Essential (primary) hypertension: Secondary | ICD-10-CM | POA: Diagnosis not present

## 2023-04-22 DIAGNOSIS — E039 Hypothyroidism, unspecified: Secondary | ICD-10-CM | POA: Insufficient documentation

## 2023-04-22 DIAGNOSIS — Z743 Need for continuous supervision: Secondary | ICD-10-CM | POA: Diagnosis not present

## 2023-04-22 DIAGNOSIS — G309 Alzheimer's disease, unspecified: Secondary | ICD-10-CM | POA: Insufficient documentation

## 2023-04-22 DIAGNOSIS — Z79899 Other long term (current) drug therapy: Secondary | ICD-10-CM | POA: Diagnosis not present

## 2023-04-22 DIAGNOSIS — I70223 Atherosclerosis of native arteries of extremities with rest pain, bilateral legs: Secondary | ICD-10-CM | POA: Diagnosis not present

## 2023-04-22 DIAGNOSIS — I129 Hypertensive chronic kidney disease with stage 1 through stage 4 chronic kidney disease, or unspecified chronic kidney disease: Secondary | ICD-10-CM | POA: Diagnosis not present

## 2023-04-22 DIAGNOSIS — Z7982 Long term (current) use of aspirin: Secondary | ICD-10-CM | POA: Diagnosis not present

## 2023-04-22 DIAGNOSIS — I499 Cardiac arrhythmia, unspecified: Secondary | ICD-10-CM | POA: Diagnosis not present

## 2023-04-22 DIAGNOSIS — R001 Bradycardia, unspecified: Secondary | ICD-10-CM | POA: Diagnosis not present

## 2023-04-22 LAB — CBC WITH DIFFERENTIAL/PLATELET
Abs Immature Granulocytes: 0.05 10*3/uL (ref 0.00–0.07)
Basophils Absolute: 0 10*3/uL (ref 0.0–0.1)
Basophils Relative: 1 %
Eosinophils Absolute: 0.3 10*3/uL (ref 0.0–0.5)
Eosinophils Relative: 3 %
HCT: 34.9 % — ABNORMAL LOW (ref 36.0–46.0)
Hemoglobin: 11.1 g/dL — ABNORMAL LOW (ref 12.0–15.0)
Immature Granulocytes: 1 %
Lymphocytes Relative: 21 %
Lymphs Abs: 1.8 10*3/uL (ref 0.7–4.0)
MCH: 30.5 pg (ref 26.0–34.0)
MCHC: 31.8 g/dL (ref 30.0–36.0)
MCV: 95.9 fL (ref 80.0–100.0)
Monocytes Absolute: 1.3 10*3/uL — ABNORMAL HIGH (ref 0.1–1.0)
Monocytes Relative: 15 %
Neutro Abs: 5.2 10*3/uL (ref 1.7–7.7)
Neutrophils Relative %: 59 %
Platelets: 486 10*3/uL — ABNORMAL HIGH (ref 150–400)
RBC: 3.64 MIL/uL — ABNORMAL LOW (ref 3.87–5.11)
RDW: 13.5 % (ref 11.5–15.5)
WBC: 8.7 10*3/uL (ref 4.0–10.5)
nRBC: 0 % (ref 0.0–0.2)

## 2023-04-22 LAB — URINALYSIS, W/ REFLEX TO CULTURE (INFECTION SUSPECTED)
Bacteria, UA: NONE SEEN
Bilirubin Urine: NEGATIVE
Glucose, UA: NEGATIVE mg/dL
Hgb urine dipstick: NEGATIVE
Ketones, ur: NEGATIVE mg/dL
Leukocytes,Ua: NEGATIVE
Nitrite: NEGATIVE
Protein, ur: NEGATIVE mg/dL
Specific Gravity, Urine: 1.019 (ref 1.005–1.030)
pH: 5 (ref 5.0–8.0)

## 2023-04-22 LAB — COMPREHENSIVE METABOLIC PANEL
ALT: 56 U/L — ABNORMAL HIGH (ref 0–44)
AST: 65 U/L — ABNORMAL HIGH (ref 15–41)
Albumin: 2.9 g/dL — ABNORMAL LOW (ref 3.5–5.0)
Alkaline Phosphatase: 72 U/L (ref 38–126)
Anion gap: 9 (ref 5–15)
BUN: 22 mg/dL (ref 8–23)
CO2: 28 mmol/L (ref 22–32)
Calcium: 8.5 mg/dL — ABNORMAL LOW (ref 8.9–10.3)
Chloride: 100 mmol/L (ref 98–111)
Creatinine, Ser: 0.76 mg/dL (ref 0.44–1.00)
GFR, Estimated: >60 mL/min (ref >60)
Glucose, Bld: 118 mg/dL — ABNORMAL HIGH (ref 70–99)
Potassium: 3.5 mmol/L (ref 3.5–5.1)
Sodium: 137 mmol/L (ref 135–145)
Total Bilirubin: 0.5 mg/dL (ref 0.3–1.2)
Total Protein: 7.1 g/dL (ref 6.5–8.1)

## 2023-04-22 LAB — CBG MONITORING, ED: Glucose-Capillary: 109 mg/dL — ABNORMAL HIGH (ref 70–99)

## 2023-04-22 MED ORDER — SODIUM CHLORIDE 0.9 % IV BOLUS
500.0000 mL | Freq: Once | INTRAVENOUS | Status: AC
  Administered 2023-04-22: 500 mL via INTRAVENOUS

## 2023-04-22 NOTE — ED Notes (Signed)
Pt came to door yelling for help. Pt ambulated to bathroom with assistance.

## 2023-04-22 NOTE — Discharge Instructions (Addendum)
You are seen in the emergency department for evaluation of weakness.  You had lab work and urinalysis, chest x-ray that did not show any definite explanation for your symptoms.  Please stay well-hydrated and follow-up with your primary care doctor.  Return to the emergency department if any worsening or concerning symptoms.

## 2023-04-22 NOTE — ED Provider Notes (Signed)
[place] EMERGENCY DEPARTMENT AT [facility] Provider Note   CSN: [id number] Arrival date & time: 04/22/23  1315     History  Chief Complaint  Patient presents with   Weakness    April [name] is a 89 y.o. female.  Pt is a 89 yo female with pmhx significant for hld, hypothyroidism, htn, and alzheimer's disease.  Pt was sent from her facility with increased weakness.  Pt said she feels fine.  She is a poor historian.         Home Medications Prior to Admission medications   Medication Sig Start Date End Date Taking? Authorizing Provider  aspirin 81 MG tablet Take 1 tablet (81 mg total) by mouth daily with breakfast. 11/25/22   Emokpae, Courage, MD  atorvastatin (LIPITOR) 20 MG tablet Take 20 mg by mouth daily. 11/20/22   [provider]  cephALEXin (KEFLEX) 500 MG capsule Take 1 capsule (500 mg total) by mouth 2 (two) times daily for 6 days. 04/18/23 04/24/23  Tyrone Nine, MD  Ferrous Sulfate (IRON) 325 (65 Fe) MG TABS Take 1 tablet by mouth daily. Patient not taking: Reported on 04/17/2023 12/11/22   [provider]  folic acid (FOLVITE) 1 MG tablet Take 1 mg by mouth daily. 01/15/23   [provider]  levothyroxine (SYNTHROID) 75 MCG tablet Take 1 tablet (75 mcg total) by mouth daily before breakfast. 01/09/22   Daphine Deutscher, [name], FNP  lisinopril (ZESTRIL) 20 MG tablet Take 1 tablet (20 mg total) by mouth daily. 11/25/22   Shon Hale, MD  melatonin 3 MG TABS tablet Take 3 mg by mouth at bedtime.    [provider]  memantine (NAMENDA XR) 21 MG CP24 24 hr capsule TAKE (1) CAPSULE DAILY 01/09/22   Daphine Deutscher, [name], FNP  mirtazapine (REMERON) 7.5 MG tablet Take 7.5 mg by mouth at bedtime. 04/11/23   [provider]  QUEtiapine (SEROQUEL) 25 MG tablet Take 1 tablet (25 mg total) by mouth at bedtime. Patient taking differently: Take 25 mg by mouth 2 (two) times daily. 11/25/22   Shon Hale, MD  sertraline (ZOLOFT)  50 MG tablet Take 1 tablet (50 mg total) by mouth daily. 01/09/22   Daphine Deutscher [name], FNP      Allergies    Acid reducer [cimetidine] and Mevacor [lovastatin]    Review of Systems   Review of Systems  Unable to perform ROS: Dementia  All other systems reviewed and are negative.   Physical Exam Updated Vital Signs BP (!) 124/56   Pulse (!) 46   Temp 97.6 F (36.4 C)   Resp 12   Ht 5\' 6"  (1.676 m)   Wt 49.1 kg   SpO2 100%   BMI 17.47 kg/m  Physical Exam Vitals and nursing note reviewed.  Constitutional:      Appearance: Normal appearance.  HENT:     Head: Normocephalic and atraumatic.     Right Ear: External ear normal.     Left Ear: External ear normal.     Nose: Nose normal.     Mouth/Throat:     Mouth: Mucous membranes are dry.  Eyes:     Extraocular Movements: Extraocular movements intact.     Conjunctiva/sclera: Conjunctivae normal.     Pupils: Pupils are equal, round, and reactive to light.  Cardiovascular:     Rate and Rhythm: Normal rate and regular rhythm.     Pulses: Normal pulses.     Heart sounds: Normal heart sounds.  Pulmonary:  Effort: Pulmonary effort is normal.  Abdominal:     General: Abdomen is flat. Bowel sounds are normal.     Palpations: Abdomen is soft.  Musculoskeletal:        General: Normal range of motion.     Cervical back: Normal range of motion and neck supple.  Skin:    General: Skin is warm.     Capillary Refill: Capillary refill takes less than 2 seconds.  Neurological:     Mental Status: She is alert. Mental status is at baseline.  Psychiatric:        Mood and Affect: Mood normal.     ED Results / Procedures / Treatments   Labs (all labs ordered are listed, but only abnormal results are displayed) Labs Reviewed  CBC WITH DIFFERENTIAL/PLATELET - Abnormal; Notable for the following components:      Result Value   RBC 3.64 (*)    Hemoglobin 11.1 (*)    HCT 34.9 (*)    Platelets 486 (*)    Monocytes Absolute 1.3  (*)    All other components within normal limits  COMPREHENSIVE METABOLIC PANEL - Abnormal; Notable for the following components:   Glucose, Bld 118 (*)    Calcium 8.5 (*)    Albumin 2.9 (*)    AST 65 (*)    ALT 56 (*)    All other components within normal limits  CBG MONITORING, ED - Abnormal; Notable for the following components:   Glucose-Capillary 109 (*)    All other components within normal limits  URINALYSIS, W/ REFLEX TO CULTURE (INFECTION SUSPECTED)    EKG None  Radiology DG Chest Portable 1 View  Result Date: 04/22/2023 CLINICAL DATA:  ams EXAM: PORTABLE CHEST 1 VIEW COMPARISON:  Chest x-ray Apr 17, 2023. FINDINGS: Mild streaky left basilar opacities. No confluent consolidation. No visible pleural effusions or pneumothorax. Cardiomediastinal silhouette is within normal limits. Polyarticular degenerative change. IMPRESSION: Mild streaky left basilar opacities, favor atelectasis. Electronically Signed   By: Feliberto Harts M.D.   On: 04/22/2023 14:50    Procedures Procedures    Medications Ordered in ED Medications - No data to display  ED Course/ Medical Decision Making/ A&P                             Medical Decision Making Amount and/or Complexity of Data Reviewed Labs: ordered. Radiology: ordered.   This patient presents to the ED for concern of weakness, this involves an extensive number of treatment options, and is a complaint that carries with it a high risk of complications and morbidity.  The differential diagnosis includes infection, electrolyte abn   Co morbidities that complicate the patient evaluation  hld, hypothyroidism, htn, and alzheimer's disease   Additional history obtained:  Additional history obtained from epic chart review External records from outside source obtained and reviewed including EMS report   Lab Tests:  I Ordered, and personally interpreted labs.  The pertinent results include:  cbc with hgb 11.1 (stable), cmp with  mild elevation LFTs (AST 65 and ALT 56)   Imaging Studies ordered:  I ordered imaging studies including cxr  I independently visualized and interpreted imaging which showed Mild streaky left basilar opacities, favor atelectasis.  I agree with the radiologist interpretation   Cardiac Monitoring:  The patient was maintained on a cardiac monitor.  I personally viewed and interpreted the cardiac monitored which showed an underlying rhythm of: nsr   Medicines  ordered and prescription drug management:  I ordered medication including ivfs  for dehydration  Reevaluation of the patient after these medicines showed that the patient improved I have reviewed the patients home medicines and have made adjustments as needed   Test Considered:  cxr   Critical Interventions:  ivfs   Problem List / ED Course:  Weakness:  pt has no complaints.  Labs not significantly abn.  Ua pending at shift change.  Pt will likely be able to go back to facility.  Pt signed out to Dr. Charm Barges.   Reevaluation:  After the interventions noted above, I reevaluated the patient and found that they have :improved   Social Determinants of Health:  Lives at facility   Dispostion:  Pending at shift change.        Final Clinical Impression(s) / ED Diagnoses Final diagnoses:  None    Rx / DC Orders ED Discharge Orders     None         Jacalyn Lefevre, MD 04/22/23 [phone]

## 2023-04-22 NOTE — ED Triage Notes (Signed)
Patient arrives via EMS from [facility] - Staff says patient is having increased weakness and decreased appetite. They also say she BP elevated and HR high but did not provide any information on that

## 2023-04-22 NOTE — ED Provider Notes (Signed)
Signout from Dr. Particia Nearing.  89 year old female from nursing facility here for evaluation of weakness.  Workup so far has been fairly unrevealing.  Urinalysis is pending.  Likely can return back to her facility plus minus antibiotics if indicated. Physical Exam  BP (!) 124/56   Pulse (!) 46   Temp 97.6 F (36.4 C)   Resp 12   Ht 5\' 6"  (1.676 m)   Wt 49.1 kg   SpO2 100%   BMI 17.47 kg/m   Physical Exam  Procedures  Procedures  ED Course / MDM    Medical Decision Making Amount and/or Complexity of Data Reviewed Labs: ordered. Radiology: ordered.   Urinalysis does not show any definite signs of infection.  Will return her back to her facility where they can continue to observe her.       Terrilee Files, MD 04/23/23 [phone]

## 2023-04-23 DIAGNOSIS — I440 Atrioventricular block, first degree: Secondary | ICD-10-CM | POA: Diagnosis not present

## 2023-04-23 DIAGNOSIS — E876 Hypokalemia: Secondary | ICD-10-CM | POA: Diagnosis not present

## 2023-04-23 DIAGNOSIS — I959 Hypotension, unspecified: Secondary | ICD-10-CM | POA: Diagnosis not present

## 2023-04-23 DIAGNOSIS — N390 Urinary tract infection, site not specified: Secondary | ICD-10-CM | POA: Diagnosis not present

## 2023-04-23 DIAGNOSIS — R001 Bradycardia, unspecified: Secondary | ICD-10-CM | POA: Diagnosis not present

## 2023-04-25 DIAGNOSIS — D518 Other vitamin B12 deficiency anemias: Secondary | ICD-10-CM | POA: Diagnosis not present

## 2023-04-25 DIAGNOSIS — Z79899 Other long term (current) drug therapy: Secondary | ICD-10-CM | POA: Diagnosis not present

## 2023-04-25 DIAGNOSIS — E782 Mixed hyperlipidemia: Secondary | ICD-10-CM | POA: Diagnosis not present

## 2023-04-25 DIAGNOSIS — E119 Type 2 diabetes mellitus without complications: Secondary | ICD-10-CM | POA: Diagnosis not present

## 2023-04-26 DIAGNOSIS — R011 Cardiac murmur, unspecified: Secondary | ICD-10-CM | POA: Diagnosis not present

## 2023-04-28 ENCOUNTER — Encounter (HOSPITAL_COMMUNITY): Payer: Self-pay | Admitting: Emergency Medicine

## 2023-04-28 ENCOUNTER — Emergency Department (HOSPITAL_COMMUNITY): Payer: Medicare Other

## 2023-04-28 ENCOUNTER — Emergency Department (HOSPITAL_COMMUNITY)
Admission: EM | Admit: 2023-04-28 | Discharge: 2023-04-29 | Disposition: A | Discharge status: Home / Self Care | Payer: Medicare Other | Attending: Emergency Medicine | Admitting: Emergency Medicine

## 2023-04-28 ENCOUNTER — Other Ambulatory Visit: Payer: Self-pay

## 2023-04-28 DIAGNOSIS — S161XXA Strain of muscle, fascia and tendon at neck level, initial encounter: Secondary | ICD-10-CM | POA: Diagnosis not present

## 2023-04-28 DIAGNOSIS — Y92129 Unspecified place in nursing home as the place of occurrence of the external cause: Secondary | ICD-10-CM | POA: Insufficient documentation

## 2023-04-28 DIAGNOSIS — W01198A Fall on same level from slipping, tripping and stumbling with subsequent striking against other object, initial encounter: Secondary | ICD-10-CM | POA: Insufficient documentation

## 2023-04-28 DIAGNOSIS — Z743 Need for continuous supervision: Secondary | ICD-10-CM | POA: Diagnosis not present

## 2023-04-28 DIAGNOSIS — Z7982 Long term (current) use of aspirin: Secondary | ICD-10-CM | POA: Diagnosis not present

## 2023-04-28 DIAGNOSIS — S5001XA Contusion of right elbow, initial encounter: Secondary | ICD-10-CM

## 2023-04-28 DIAGNOSIS — I10 Essential (primary) hypertension: Secondary | ICD-10-CM | POA: Diagnosis not present

## 2023-04-28 DIAGNOSIS — E039 Hypothyroidism, unspecified: Secondary | ICD-10-CM | POA: Insufficient documentation

## 2023-04-28 DIAGNOSIS — S0003XA Contusion of scalp, initial encounter: Secondary | ICD-10-CM | POA: Diagnosis not present

## 2023-04-28 DIAGNOSIS — R404 Transient alteration of awareness: Secondary | ICD-10-CM | POA: Diagnosis not present

## 2023-04-28 DIAGNOSIS — S0990XA Unspecified injury of head, initial encounter: Secondary | ICD-10-CM

## 2023-04-28 DIAGNOSIS — R6889 Other general symptoms and signs: Secondary | ICD-10-CM | POA: Diagnosis not present

## 2023-04-28 DIAGNOSIS — S0101XA Laceration without foreign body of scalp, initial encounter: Secondary | ICD-10-CM | POA: Diagnosis not present

## 2023-04-28 DIAGNOSIS — Z79899 Other long term (current) drug therapy: Secondary | ICD-10-CM | POA: Diagnosis not present

## 2023-04-28 DIAGNOSIS — W19XXXA Unspecified fall, initial encounter: Secondary | ICD-10-CM | POA: Diagnosis not present

## 2023-04-28 DIAGNOSIS — M25521 Pain in right elbow: Secondary | ICD-10-CM | POA: Insufficient documentation

## 2023-04-28 DIAGNOSIS — S199XXA Unspecified injury of neck, initial encounter: Secondary | ICD-10-CM | POA: Diagnosis not present

## 2023-04-28 DIAGNOSIS — S0181XA Laceration without foreign body of other part of head, initial encounter: Secondary | ICD-10-CM | POA: Diagnosis not present

## 2023-04-28 DIAGNOSIS — F0390 Unspecified dementia without behavioral disturbance: Secondary | ICD-10-CM | POA: Diagnosis not present

## 2023-04-28 NOTE — ED Notes (Signed)
Patient transported to CT 

## 2023-04-28 NOTE — ED Triage Notes (Signed)
Pt from SNF with c/o fall an hour ago. Pt hit back of head with small lac noted. Bleeding is contained. C/o right elbow and head pain at this time. Pt is not on thinners.

## 2023-04-28 NOTE — ED Provider Notes (Signed)
[place] EMERGENCY DEPARTMENT AT [facility] Provider Note   CSN: [id number] Arrival date & time: 04/28/23  2320     History  Chief Complaint  Patient presents with   Laurence Compton is a 89 y.o. female.  Patient is an 89 year old female with past medical history of hypertension, hyperlipidemia, hypothyroidism, dementia.  Patient sent from her extended care facility for evaluation of a fall.  Patient without much recollection of the incident, but I am told that she fell backward and hit the back of her head.  She had a small laceration to the occipital region, but no loss of consciousness.  She denies to me she is experiencing a headache.  She also denies neck pain.  Only other complaint is right elbow pain.  The history is provided by the patient.       Home Medications Prior to Admission medications   Medication Sig Start Date End Date Taking? Authorizing Provider  aspirin 81 MG tablet Take 1 tablet (81 mg total) by mouth daily with breakfast. 11/25/22   Emokpae, Courage, MD  atorvastatin (LIPITOR) 20 MG tablet Take 20 mg by mouth daily. 11/20/22   [provider]  Ferrous Sulfate (IRON) 325 (65 Fe) MG TABS Take 1 tablet by mouth daily. 12/11/22   [provider]  folic acid (FOLVITE) 1 MG tablet Take 1 mg by mouth daily. 01/15/23   [provider]  levothyroxine (SYNTHROID) 75 MCG tablet Take 1 tablet (75 mcg total) by mouth daily before breakfast. 01/09/22   Daphine Deutscher, [name], FNP  lisinopril (ZESTRIL) 20 MG tablet Take 1 tablet (20 mg total) by mouth daily. 11/25/22   Shon Hale, MD  melatonin 3 MG TABS tablet Take 3 mg by mouth at bedtime.    [provider]  memantine (NAMENDA XR) 21 MG CP24 24 hr capsule TAKE (1) CAPSULE DAILY 01/09/22   Daphine Deutscher, [name], FNP  mirtazapine (REMERON) 7.5 MG tablet Take 7.5 mg by mouth at bedtime. 04/11/23   [provider]  QUEtiapine (SEROQUEL) 25 MG tablet Take 1 tablet  (25 mg total) by mouth at bedtime. Patient taking differently: Take 25 mg by mouth 2 (two) times daily. 11/25/22   Shon Hale, MD  sertraline (ZOLOFT) 50 MG tablet Take 1 tablet (50 mg total) by mouth daily. 01/09/22   Daphine Deutscher [name], FNP      Allergies    Acid reducer [cimetidine] and Mevacor [lovastatin]    Review of Systems   Review of Systems  All other systems reviewed and are negative.   Physical Exam Updated Vital Signs BP 135/61 (BP Location: Left Arm)   Pulse 61   Temp 99.1 F (37.3 C) (Oral)   Resp 20   Ht 5\' 6"  (1.676 m)   Wt 49.1 kg   SpO2 96%   BMI 17.47 kg/m  Physical Exam Vitals and nursing note reviewed.  Constitutional:      General: She is not in acute distress.    Appearance: She is well-developed. She is not diaphoretic.  HENT:     Head: Normocephalic.     Comments: There is a small, less than 1 cm, superficial laceration noted to the occipital region.  Bleeding is controlled. Eyes:     Extraocular Movements: Extraocular movements intact.     Pupils: Pupils are equal, round, and reactive to light.  Cardiovascular:     Rate and Rhythm: Normal rate and regular rhythm.     Heart sounds: No murmur  heard.    No friction rub. No gallop.  Pulmonary:     Effort: Pulmonary effort is normal. No respiratory distress.     Breath sounds: Normal breath sounds. No wheezing.  Abdominal:     General: Bowel sounds are normal. There is no distension.     Palpations: Abdomen is soft.     Tenderness: There is no abdominal tenderness.  Musculoskeletal:        General: Normal range of motion.     Cervical back: Normal range of motion and neck supple.     Comments: There is mild swelling lateral to the olecranon.  There is no obvious deformity.  She does have some pain with range of motion.  Capillary refill is brisk and motor and sensation are intact to all fingers.  She is wearing a wrist splint from a prior fall, but denies worsening of this discomfort.   Skin:    General: Skin is warm and dry.  Neurological:     General: No focal deficit present.     Mental Status: She is alert and oriented to person, place, and time.     Cranial Nerves: No cranial nerve deficit.     Motor: No weakness.     ED Results / Procedures / Treatments   Labs (all labs ordered are listed, but only abnormal results are displayed) Labs Reviewed - No data to display  EKG None  Radiology No results found.  Procedures Procedures  {Document cardiac monitor, telemetry assessment procedure when appropriate:1}  Medications Ordered in ED Medications - No data to display  ED Course/ Medical Decision Making/ A&P   {   Click here for ABCD2, HEART and other calculatorsREFRESH Note before signing :1}                          Medical Decision Making Amount and/or Complexity of Data Reviewed Radiology: ordered.   ***  {Document critical care time when appropriate:1} {Document review of labs and clinical decision tools ie heart score, Chads2Vasc2 etc:1}  {Document your independent review of radiology images, and any outside records:1} {Document your discussion with family members, caretakers, and with consultants:1} {Document social determinants of health affecting pt's care:1} {Document your decision making why or why not admission, treatments were needed:1} Final Clinical Impression(s) / ED Diagnoses Final diagnoses:  None    Rx / DC Orders ED Discharge Orders     None

## 2023-04-28 NOTE — ED Notes (Signed)
ED Provider at bedside. 

## 2023-04-29 ENCOUNTER — Emergency Department (HOSPITAL_COMMUNITY): Payer: Medicare Other

## 2023-04-29 DIAGNOSIS — M25521 Pain in right elbow: Secondary | ICD-10-CM | POA: Diagnosis not present

## 2023-04-29 DIAGNOSIS — S0990XA Unspecified injury of head, initial encounter: Secondary | ICD-10-CM | POA: Diagnosis not present

## 2023-04-29 DIAGNOSIS — S199XXA Unspecified injury of neck, initial encounter: Secondary | ICD-10-CM | POA: Diagnosis not present

## 2023-04-29 NOTE — Discharge Instructions (Signed)
Return to the emergency department for severe headache, intractable vomiting, seizures/convulsions, or for other new and concerning symptoms.

## 2023-04-30 DIAGNOSIS — R296 Repeated falls: Secondary | ICD-10-CM | POA: Diagnosis not present

## 2023-04-30 DIAGNOSIS — G309 Alzheimer's disease, unspecified: Secondary | ICD-10-CM | POA: Diagnosis not present

## 2023-05-08 ENCOUNTER — Encounter: Payer: Self-pay | Admitting: Internal Medicine

## 2023-05-08 ENCOUNTER — Ambulatory Visit: Payer: Medicare Other | Attending: Internal Medicine | Admitting: Internal Medicine

## 2023-05-08 VITALS — BP 168/76 | HR 70 | Ht 64.0 in | Wt 113.4 lb

## 2023-05-08 DIAGNOSIS — I10 Essential (primary) hypertension: Secondary | ICD-10-CM | POA: Diagnosis not present

## 2023-05-08 NOTE — Progress Notes (Signed)
Cardiology Office Note  Date: 05/08/2023   ID: Cyerra, Lehne [birth year], MRN [id number]  PCP:  Galvin Proffer, MD  Cardiologist:  Marjo Bicker, MD Electrophysiologist:  None   Reason for Office Visit: Follow-up visit of syncope.   History of Present Illness: April [name] is a 89 y.o. female known to have Alzheimer's dementia, HTN, hypothyroidism presented to cardiology clinic for evaluation of syncope.  Patient was initially referred to cardiology clinic for evaluation of questionable syncope. Patient was brought to ER on 11/25/2022 for questionable syncope, negative orthostatic vitals, labs unremarkable, TSH 0.4 and troponins were within normal limits. EKG showed sinus bradycardia with first-degree AV block. Antihypertensive medication doses were decreased after which she did not have any recurrent syncopal episodes.  She underwent echocardiogram which showed normal LVEF, mild MR and mild to moderate TR. Event monitor showed normal sinus rhythm, first-degree AV block and 42 brief episodes of SVT. Patient had multiple ER visits for generalized weakness and dizziness. Patient presented today for follow-up visit, accompanied by caretaker.  Difficult to elicit any history from the patient due to dementia but she reported having intermittent dizziness on standing. Otherwise denies any chest pain, DOE, syncope, palpitations and leg swelling. Does not drink much water in the facility.  Orthostatic vitals today are positive for orthostatic hypotension. No prior history of ischemic evaluation.  No prior history of MI/PCI/CABG.  Patient's son is the next of kin and makes decision for the patient.  Echocardiogram from 04/26/2023 showed normal LVEF, mild MR and trace TR.  Past Medical History:  Diagnosis Date   Alzheimer disease (HCC)    Arthritis    Cognitive changes    Hyperlipidemia    Hypertension    Thyroid disease     Past Surgical History:  Procedure Laterality Date   FOOT  SURGERY     Arthritis    HAND SURGERY     Arthritis    thumb surgery Left    [facility]    VAGINAL HYSTERECTOMY      Current Outpatient Medications  Medication Sig Dispense Refill   aspirin 81 MG tablet Take 1 tablet (81 mg total) by mouth daily with breakfast. 30 tablet 3   atorvastatin (LIPITOR) 20 MG tablet Take 20 mg by mouth daily.     Ferrous Sulfate (IRON) 325 (65 Fe) MG TABS Take 1 tablet by mouth daily.     folic acid (FOLVITE) 1 MG tablet Take 1 mg by mouth daily.     levothyroxine (SYNTHROID) 75 MCG tablet Take 1 tablet (75 mcg total) by mouth daily before breakfast. 90 tablet 1   lisinopril (ZESTRIL) 20 MG tablet Take 1 tablet (20 mg total) by mouth daily. 30 tablet 2   melatonin 3 MG TABS tablet Take 3 mg by mouth at bedtime.     memantine (NAMENDA XR) 21 MG CP24 24 hr capsule TAKE (1) CAPSULE DAILY 90 capsule 1   mirtazapine (REMERON) 7.5 MG tablet Take 7.5 mg by mouth at bedtime.     QUEtiapine (SEROQUEL) 25 MG tablet Take 1 tablet (25 mg total) by mouth at bedtime. (Patient taking differently: Take 25 mg by mouth 2 (two) times daily.) 30 tablet 2   sertraline (ZOLOFT) 50 MG tablet Take 1 tablet (50 mg total) by mouth daily. 90 tablet 1   No current facility-administered medications for this visit.   Allergies:  Acid reducer [cimetidine] and Mevacor [lovastatin]   Social History: The patient  reports that  she has quit smoking. She has never used smokeless tobacco. She reports that she does not currently use alcohol after a past usage of about 1.0 standard drink of alcohol per week. She reports that she does not use drugs.   Family History: The patient's family history includes Alcohol abuse in her father; Dementia in her sister; Pneumonia in her father.   ROS:  Please see the history of present illness. Otherwise, complete review of systems is positive for none.  All other systems are reviewed and negative.   Physical Exam: VS:  There were no vitals taken for this  visit., BMI There is no height or weight on file to calculate BMI.  Wt Readings from Last 3 Encounters:  04/28/23 108 lb 3.9 oz (49.1 kg)  04/22/23 108 lb 3.9 oz (49.1 kg)  04/17/23 108 lb 3.9 oz (49.1 kg)    General: Patient appears comfortable at rest. HEENT: Conjunctiva and lids normal, oropharynx clear with moist mucosa. Neck: Supple, no elevated JVP or carotid bruits, no thyromegaly. Lungs: Clear to auscultation, nonlabored breathing at rest. Cardiac: Regular rate and rhythm, no S3 or significant systolic murmur, no pericardial rub. Abdomen: Soft, nontender, no hepatomegaly, bowel sounds present, no guarding or rebound. Extremities: No pitting edema, distal pulses 2+. Skin: Warm and dry. Musculoskeletal: No kyphosis. Neuropsychiatric: Alert and oriented to self  ECG: Sinus bradycardia with first-degree AV block  Recent Labwork: 04/17/2023: Magnesium 1.8 04/18/2023: TSH 2.647 04/22/2023: ALT 56; AST 65; BUN 22; Creatinine, Ser 0.76; Hemoglobin 11.1; Platelets 486; Potassium 3.5; Sodium 137     Component Value Date/Time   CHOL 273 (H) 01/09/2022 1512   TRIG 244 (H) 01/09/2022 1512   TRIG 241 (H) 08/19/2014 0811   HDL 55 01/09/2022 1512   HDL 41 08/19/2014 0811   CHOLHDL 5.0 (H) 01/09/2022 1512   LDLCALC 172 (H) 01/09/2022 1512   LDLCALC 201 (H) 08/19/2014 1610    Other Studies Reviewed Today: Echocardiogram and event monitor  Echocardiogram from 04/26/2023 showed normal LVEF, trace TR and mild MR.  Assessment and Plan: Patient is 89 year old F known to have Alzheimer's dementia, HTN presented to cardiology clinic for evaluation of syncope.  # Dizziness/lightheadedness secondary to orthostatic hypotension -Orthostatic vitals today positive for orthostatic hypotension Lying: 187/81, 68 bpm Sitting: 163/79, 67 bpm Standing: 135/83, 74 bpm -Strongly encouraged and recommended 2L of p.o. fluid intake -Amlodipine was also discontinued, will continue lisinopril at the current  dose.  # Sinus bradycardia with first-degree AV block, bradycardia resolved -EKG showed NSR, first-degree AV block with PR more than 300 ms. Echocardiogram showed normal LVEF, mild MR, mild to moderate TR. No evidence of structural heart disease. Event monitor showed NSR, first-degree AV block, 42 brief SVT episodes (fastest lasting 4 beats and longest lasting 13 beats).  -No intervention  # Valvular heart disease, mild MR and trace TR -Obtain 2D echocardiogram in 3 years  # HTN, controlled -Amlodipine discontinued due to orthostatic hypotension in the prior ER visits in 5/24 -Continue lisinopril 20 mg once daily  # HLD -Continue atorvastatin 20 mg nightly -Management of HLD per PCP  I have spent a total of 30 minutes with patient reviewing chart , telemetry, EKGs, labs and examining patient as well as establishing an assessment and plan that was discussed with the patient.  > 50% of time was spent in direct patient care.    Medication Adjustments/Labs and Tests Ordered: Current medicines are reviewed at length with the patient today.  Concerns regarding medicines are  outlined above.   Tests Ordered: No orders of the defined types were placed in this encounter.   Medication Changes: No orders of the defined types were placed in this encounter.   Disposition:  Follow up  2 years  Signed Aliciana Ricciardi Verne Spurr, MD, 05/08/2023 10:19 AM    [facility] at [facility] [address] [place], [place], [place] [address]

## 2023-05-08 NOTE — Patient Instructions (Addendum)
Medication Instructions:  Your physician recommends that you continue on your current medications as directed. Please refer to the Current Medication list given to you today.   Labwork: None  Testing/Procedures: None  Follow-Up: Your physician recommends that you schedule a follow-up appointment in: 2 years. You will receive a reminder call in the mail in about 18 months reminding you to call and schedule your appointment. If you don't receive this call, please contact our office.   Any Other Special Instructions Will Be Listed Below (If Applicable).  If you need a refill on your cardiac medications before your next appointment, please call your pharmacy.

## 2023-05-12 ENCOUNTER — Emergency Department (HOSPITAL_COMMUNITY): Payer: Medicare Other

## 2023-05-12 ENCOUNTER — Emergency Department (HOSPITAL_COMMUNITY)
Admission: EM | Admit: 2023-05-12 | Discharge: 2023-05-12 | Disposition: A | Discharge status: Skilled Nursing Facility | Payer: Medicare Other | Attending: Emergency Medicine | Admitting: Emergency Medicine

## 2023-05-12 DIAGNOSIS — S0990XA Unspecified injury of head, initial encounter: Secondary | ICD-10-CM | POA: Diagnosis not present

## 2023-05-12 DIAGNOSIS — R0689 Other abnormalities of breathing: Secondary | ICD-10-CM | POA: Diagnosis not present

## 2023-05-12 DIAGNOSIS — S199XXA Unspecified injury of neck, initial encounter: Secondary | ICD-10-CM | POA: Diagnosis not present

## 2023-05-12 DIAGNOSIS — M79641 Pain in right hand: Secondary | ICD-10-CM | POA: Diagnosis not present

## 2023-05-12 DIAGNOSIS — Z743 Need for continuous supervision: Secondary | ICD-10-CM | POA: Diagnosis not present

## 2023-05-12 DIAGNOSIS — S0101XA Laceration without foreign body of scalp, initial encounter: Secondary | ICD-10-CM | POA: Diagnosis not present

## 2023-05-12 DIAGNOSIS — S0003XA Contusion of scalp, initial encounter: Secondary | ICD-10-CM | POA: Diagnosis not present

## 2023-05-12 DIAGNOSIS — W19XXXA Unspecified fall, initial encounter: Secondary | ICD-10-CM | POA: Diagnosis not present

## 2023-05-12 DIAGNOSIS — Z043 Encounter for examination and observation following other accident: Secondary | ICD-10-CM | POA: Diagnosis not present

## 2023-05-12 DIAGNOSIS — R6889 Other general symptoms and signs: Secondary | ICD-10-CM | POA: Diagnosis not present

## 2023-05-12 DIAGNOSIS — W010XXA Fall on same level from slipping, tripping and stumbling without subsequent striking against object, initial encounter: Secondary | ICD-10-CM | POA: Diagnosis not present

## 2023-05-12 DIAGNOSIS — R404 Transient alteration of awareness: Secondary | ICD-10-CM | POA: Diagnosis not present

## 2023-05-12 NOTE — ED Notes (Signed)
Spoke with son Casimiro Needle to affirm he is aware of her visit to the hospital and her return to [facility].

## 2023-05-12 NOTE — ED Triage Notes (Signed)
Patient brought in by [facility]. Reported to have had an unwitnessed fall and hit head. No thinners reported. No LOC reported. Patient has hematoma to back of head. Patient in NAD at this time.

## 2023-05-12 NOTE — Discharge Instructions (Signed)
Return for any problem.   Staple placed today should be removed in 5-7 days.

## 2023-05-12 NOTE — ED Provider Notes (Signed)
[place] EMERGENCY DEPARTMENT AT [facility] Provider Note   CSN: [id number] Arrival date & time: 05/12/23  4098     History  Chief Complaint  Patient presents with   April [name] is a 89 y.o. female.  89 year old female with prior medical history as detailed below presents for evaluation.  Patient resides at [facility] of [address].   Patient presents after fall earlier this morning.  Patient reports that she thinks that she lost her balance and fell.  She did strike her head.  She has a superficial contusion surrounding a 0.5 cm laceration to the posterior scalp.  She is otherwise without complaint.  Patient is alert and comfortable.  She appears to be at baseline.    The history is provided by the patient and medical records.       Home Medications Prior to Admission medications   Medication Sig Start Date End Date Taking? Authorizing Provider  aspirin 81 MG tablet Take 1 tablet (81 mg total) by mouth daily with breakfast. 11/25/22   Emokpae, Courage, MD  atorvastatin (LIPITOR) 20 MG tablet Take 20 mg by mouth daily. 11/20/22   [provider]  Ferrous Sulfate (IRON) 325 (65 Fe) MG TABS Take 1 tablet by mouth daily. 12/11/22   [provider]  folic acid (FOLVITE) 1 MG tablet Take 1 mg by mouth daily. 01/15/23   [provider]  levothyroxine (SYNTHROID) 75 MCG tablet Take 1 tablet (75 mcg total) by mouth daily before breakfast. 01/09/22   Daphine Deutscher, [name], FNP  lisinopril (ZESTRIL) 20 MG tablet Take 1 tablet (20 mg total) by mouth daily. 11/25/22   Shon Hale, MD  melatonin 3 MG TABS tablet Take 3 mg by mouth at bedtime.    [provider]  memantine (NAMENDA XR) 21 MG CP24 24 hr capsule TAKE (1) CAPSULE DAILY 01/09/22   Daphine Deutscher, [name], FNP  mirtazapine (REMERON) 7.5 MG tablet Take 7.5 mg by mouth at bedtime. 04/11/23   [provider]  QUEtiapine (SEROQUEL) 25 MG tablet Take 1 tablet (25 mg  total) by mouth at bedtime. Patient taking differently: Take 25 mg by mouth 2 (two) times daily. 11/25/22   Shon Hale, MD  sertraline (ZOLOFT) 50 MG tablet Take 1 tablet (50 mg total) by mouth daily. 01/09/22   Daphine Deutscher [name], FNP      Allergies    Acid reducer [cimetidine] and Mevacor [lovastatin]    Review of Systems   Review of Systems  All other systems reviewed and are negative.   Physical Exam Updated Vital Signs BP (!) 182/102   Pulse 68   Temp 98.5 F (36.9 C) (Oral)   Resp (!) 23   SpO2 94%  Physical Exam Vitals and nursing note reviewed.  Constitutional:      General: She is not in acute distress.    Appearance: Normal appearance. She is well-developed.  HENT:     Head: Normocephalic.     Comments: Posterior scalp laceration measuring approximate 0.5 cm with minimal bleeding. Eyes:     Conjunctiva/sclera: Conjunctivae normal.     Pupils: Pupils are equal, round, and reactive to light.  Cardiovascular:     Rate and Rhythm: Normal rate and regular rhythm.     Heart sounds: Normal heart sounds.  Pulmonary:     Effort: Pulmonary effort is normal. No respiratory distress.     Breath sounds: Normal breath sounds.  Abdominal:     General: There is no  distension.     Palpations: Abdomen is soft.     Tenderness: There is no abdominal tenderness.  Musculoskeletal:        General: No deformity. Normal range of motion.     Cervical back: Normal range of motion and neck supple.  Skin:    General: Skin is warm and dry.  Neurological:     General: No focal deficit present.     Mental Status: She is alert.     Comments: Alert, oriented to person and place.  Appears to be at baseline.  Comfortable.  No focal deficit.     ED Results / Procedures / Treatments   Labs (all labs ordered are listed, but only abnormal results are displayed) Labs Reviewed - No data to display  EKG None  Radiology DG Chest 1 View  Result Date: 05/12/2023 CLINICAL DATA:   89 year old female with history of trauma from a fall. EXAM: CHEST  1 VIEW COMPARISON:  Chest x-ray 04/22/2023. FINDINGS: Lung volumes are normal. No consolidative airspace disease. No pleural effusions. No pneumothorax. No pulmonary nodule or mass noted. Pulmonary vasculature and the cardiomediastinal silhouette are within normal limits. Atherosclerosis in the thoracic aorta. IMPRESSION: 1.  No radiographic evidence of acute cardiopulmonary disease. 2. Aortic atherosclerosis. Electronically Signed   By: Trudie Reed M.D.   On: 05/12/2023 07:54   DG Hand Complete Right  Result Date: 05/12/2023 CLINICAL DATA:  89 year old female with history of trauma from a fall complaining of right hand pain. EXAM: RIGHT HAND - COMPLETE 3+ VIEW COMPARISON:  No priors. FINDINGS: Three views of the right hand demonstrate no definite acute displaced fracture or dislocation. There is multifocal advanced joint space narrowing, subchondral sclerosis, subchondral cyst formation and osteophyte formation, most severe in the D IP and PIP joints, and the first [facility] joint, compatible with advanced osteoarthritis. IMPRESSION: 1. No acute radiographic abnormality of the right hand. 2. Advanced osteoarthritis, as above. Electronically Signed   By: Trudie Reed M.D.   On: 05/12/2023 07:49   DG Pelvis 1-2 Views  Result Date: 05/12/2023 CLINICAL DATA:  89 year old female with history of trauma from a fall. EXAM: PELVIS - 1-2 VIEW COMPARISON:  No priors. FINDINGS: No definite acute displaced fracture of the bony pelvic ring. Bone or femoral heads appear located on this single view examination. Severe joint space narrowing, subchondral sclerosis, subchondral cyst formation and osteophyte formation in the hip joints bilaterally (right greater than left), indicative of advanced osteoarthritis. IMPRESSION: 1. No definite acute displaced fracture of the bony pelvis. 2. Severe bilateral hip joint osteoarthritis (right greater than left).  Electronically Signed   By: Trudie Reed M.D.   On: 05/12/2023 07:46   CT Head Wo Contrast  Result Date: 05/12/2023 CLINICAL DATA:  Blunt poly trauma.  Fall injury. EXAM: CT HEAD WITHOUT CONTRAST CT CERVICAL SPINE WITHOUT CONTRAST TECHNIQUE: Multidetector CT imaging of the head and cervical spine was performed following the standard protocol without intravenous contrast. Multiplanar CT image reconstructions of the cervical spine were also generated. RADIATION DOSE REDUCTION: This exam was performed according to the departmental dose-optimization program which includes automated exposure control, adjustment of the mA and/or kV according to patient size and/or use of iterative reconstruction technique. COMPARISON:  Recent head CT and cervical spine CT both 04/29/2023 FINDINGS: CT HEAD FINDINGS Brain: No evidence of acute infarction, hemorrhage, hydrocephalus, extra-axial collection or mass lesion/mass effect. There is mild global atrophy and mild small-vessel disease of the cerebral white matter. Vascular: There are calcifications  in both carotid siphons, both distal vertebral arteries. No hyperdense central vessels. Skull: There is a posterior right parietal scalp hematoma with tiny air pockets consistent with a scalp laceration. There is a small scalp hematoma over the frontal vertex. There are no depressed skull fractures. There is calvarial osteopenia. Sinuses/Orbits: Negative orbits aside from old lens replacements. Clear sinuses and left mastoids. Patchy fluid again noted in the lower right mastoid air cells. Right-sided nasal septal deviation. Other: None. CT CERVICAL SPINE FINDINGS Alignment: There is no traumatic listhesis. Narrowing and osteophytes of the anterior atlantodental joint are again noted with 2-3 mm degenerative anterolisthesis again noted at C5-6, C6-7, C7-T1 and T1-2. There is also a mild cervical levoscoliosis. Skull base and vertebrae: There is osteopenia without evidence of aggressive  lesions or fractures. Soft tissues and spinal canal: No prevertebral fluid or swelling. No visible canal hematoma. Both proximal cervical ICAs are heavily calcified with likely flow-limiting stenoses particularly on the right. There is no laryngeal mass the thyroid nodule. Disc levels: The discs are variably degenerated, with the greatest disc space loss at C5-6. There are small endplate spurs at most levels but no levels show herniated discs or spondylotic cord compression. Multilevel uncinate joint and facet hypertrophy is seen, multilevel degenerative foraminal stenosis. This is most significant with severe left foraminal stenosis at C3-4, bilateral mild-to-moderate foraminal stenosis C5-6. The other foramina do not show stenosis. Upper chest: There is biapical pleuroparenchymal scarring and calcifications with centrilobular emphysema. Other: None. IMPRESSION: 1. Right parietal scalp hematoma and laceration, small frontal vertex scalp hematoma. 2. No acute intracranial CT findings or depressed skull fractures. Chronic change. 3. Osteopenia and degenerative change without evidence of cervical fractures. 4. Emphysema. 5. Patchy fluid in the lower right mastoid air cells, unchanged. 6. Proximal cervical ICA atherosclerosis with likely flow-limiting origin stenoses particularly on the right. Follow-up as indicated. Emphysema (ICD10-J43.9). Electronically Signed   By: Almira Bar M.D.   On: 05/12/2023 07:22   CT Cervical Spine Wo Contrast  Result Date: 05/12/2023 CLINICAL DATA:  Blunt poly trauma.  Fall injury. EXAM: CT HEAD WITHOUT CONTRAST CT CERVICAL SPINE WITHOUT CONTRAST TECHNIQUE: Multidetector CT imaging of the head and cervical spine was performed following the standard protocol without intravenous contrast. Multiplanar CT image reconstructions of the cervical spine were also generated. RADIATION DOSE REDUCTION: This exam was performed according to the departmental dose-optimization program which  includes automated exposure control, adjustment of the mA and/or kV according to patient size and/or use of iterative reconstruction technique. COMPARISON:  Recent head CT and cervical spine CT both 04/29/2023 FINDINGS: CT HEAD FINDINGS Brain: No evidence of acute infarction, hemorrhage, hydrocephalus, extra-axial collection or mass lesion/mass effect. There is mild global atrophy and mild small-vessel disease of the cerebral white matter. Vascular: There are calcifications in both carotid siphons, both distal vertebral arteries. No hyperdense central vessels. Skull: There is a posterior right parietal scalp hematoma with tiny air pockets consistent with a scalp laceration. There is a small scalp hematoma over the frontal vertex. There are no depressed skull fractures. There is calvarial osteopenia. Sinuses/Orbits: Negative orbits aside from old lens replacements. Clear sinuses and left mastoids. Patchy fluid again noted in the lower right mastoid air cells. Right-sided nasal septal deviation. Other: None. CT CERVICAL SPINE FINDINGS Alignment: There is no traumatic listhesis. Narrowing and osteophytes of the anterior atlantodental joint are again noted with 2-3 mm degenerative anterolisthesis again noted at C5-6, C6-7, C7-T1 and T1-2. There is also a mild cervical levoscoliosis. Skull  base and vertebrae: There is osteopenia without evidence of aggressive lesions or fractures. Soft tissues and spinal canal: No prevertebral fluid or swelling. No visible canal hematoma. Both proximal cervical ICAs are heavily calcified with likely flow-limiting stenoses particularly on the right. There is no laryngeal mass the thyroid nodule. Disc levels: The discs are variably degenerated, with the greatest disc space loss at C5-6. There are small endplate spurs at most levels but no levels show herniated discs or spondylotic cord compression. Multilevel uncinate joint and facet hypertrophy is seen, multilevel degenerative foraminal  stenosis. This is most significant with severe left foraminal stenosis at C3-4, bilateral mild-to-moderate foraminal stenosis C5-6. The other foramina do not show stenosis. Upper chest: There is biapical pleuroparenchymal scarring and calcifications with centrilobular emphysema. Other: None. IMPRESSION: 1. Right parietal scalp hematoma and laceration, small frontal vertex scalp hematoma. 2. No acute intracranial CT findings or depressed skull fractures. Chronic change. 3. Osteopenia and degenerative change without evidence of cervical fractures. 4. Emphysema. 5. Patchy fluid in the lower right mastoid air cells, unchanged. 6. Proximal cervical ICA atherosclerosis with likely flow-limiting origin stenoses particularly on the right. Follow-up as indicated. Emphysema (ICD10-J43.9). Electronically Signed   By: Almira Bar M.D.   On: 05/12/2023 07:22    Procedures .Marland KitchenLaceration Repair  Date/Time: 05/12/2023 8:08 AM  Performed by: Wynetta Fines, MD Authorized by: Wynetta Fines, MD   Consent:    Consent obtained:  Verbal   Consent given by:  Patient   Risks, benefits, and alternatives were discussed: yes     Risks discussed:  Infection, need for additional repair, nerve damage, poor wound healing, pain, poor cosmetic result and vascular damage Universal protocol:    Immediately prior to procedure, a time out was called: yes   Anesthesia:    Anesthesia method:  None Laceration details:    Location:  Scalp   Scalp location:  Occipital   Length (cm):  0.5 Pre-procedure details:    Preparation:  Patient was prepped and draped in usual sterile fashion and imaging obtained to evaluate for foreign bodies Exploration:    Limited defect created (wound extended): no     Hemostasis achieved with:  Direct pressure   Imaging outcome: foreign body not noted     Wound exploration: wound explored through full range of motion and entire depth of wound visualized   Treatment:    Area cleansed with:   Povidone-iodine   Amount of cleaning:  Standard Skin repair:    Repair method:  Staples   Number of staples:  1 Approximation:    Approximation:  Close Repair type:    Repair type:  Simple Post-procedure details:    Dressing:  Bulky dressing   Procedure completion:  Tolerated     Medications Ordered in ED Medications - No data to display  ED Course/ Medical Decision Making/ A&P                             Medical Decision Making Amount and/or Complexity of Data Reviewed Radiology: ordered.    Medical Screen Complete  This patient presented to the ED with complaint of fall, head injury, scalp laceration.  This complaint involves an extensive number of treatment options. The initial differential diagnosis includes, but is not limited to, trauma related to fall  This presentation is: Acute, Self-Limited, Previously Undiagnosed, Uncertain Prognosis, Complicated, Systemic Symptoms, and Threat to Life/Bodily Function  Patient presents after fall from standing.  She appears to have lost her balance and struck her head during the fall.  Patient with superficial scalp laceration requiring 1 staple.  This was repaired easily.  Imaging obtained is without significant abnormality.  Patient is feeling improved at completion of ED evaluation.  Case discussed briefly with the patient's son Kathlene November.  He is comfortable with plan for patient's discharge.  Importance of close follow-up is stressed with patient's son.  Strict return precautions given and understood.  Additional history obtained:  Additional history obtained from EMS External records from outside sources obtained and reviewed including prior ED visits and prior Inpatient records.  Imaging Studies ordered:  I ordered imaging studies including CT head, CT C-spine, plain films of chest, right hand, pelvis I independently visualized and interpreted obtained imaging which showed NAD I agree with the radiologist  interpretation.   Cardiac Monitoring:  The patient was maintained on a cardiac monitor.  I personally viewed and interpreted the cardiac monitor which showed an underlying rhythm of: NSR  Problem List / ED Course:  Fall, head injury, scalp laceration   Reevaluation:  After the interventions noted above, I reevaluated the patient and found that they have: improved  Disposition:  After consideration of the diagnostic results and the patients response to treatment, I feel that the patent would benefit from close outpatient follow-up.          Final Clinical Impression(s) / ED Diagnoses Final diagnoses:  Fall, initial encounter  Injury of head, initial encounter  Laceration of scalp, initial encounter    Rx / DC Orders ED Discharge Orders     None         Wynetta Fines, MD 05/12/23 [phone]

## 2023-05-12 NOTE — ED Notes (Signed)
Patient transported to X-ray 

## 2023-05-12 NOTE — ED Notes (Signed)
Patient reported by EMS to be confused, but stated that this is her baseline as they were told by facility staff. Patient alert and able to answer some questions, but not oriented.

## 2023-05-12 NOTE — ED Notes (Signed)
Message left with [facility] of [address] [phone]

## 2023-05-12 NOTE — ED Notes (Signed)
Called [facility] [place] aware patient will be returning.

## 2023-05-12 NOTE — ED Notes (Signed)
Ptar here to return patient to [facility] of [address].

## 2023-05-13 DIAGNOSIS — R279 Unspecified lack of coordination: Secondary | ICD-10-CM | POA: Diagnosis not present

## 2023-05-13 DIAGNOSIS — M6281 Muscle weakness (generalized): Secondary | ICD-10-CM | POA: Diagnosis not present

## 2023-05-13 DIAGNOSIS — G309 Alzheimer's disease, unspecified: Secondary | ICD-10-CM | POA: Diagnosis not present

## 2023-05-14 DIAGNOSIS — I10 Essential (primary) hypertension: Secondary | ICD-10-CM | POA: Diagnosis not present

## 2023-05-14 DIAGNOSIS — G4700 Insomnia, unspecified: Secondary | ICD-10-CM | POA: Diagnosis not present

## 2023-05-14 DIAGNOSIS — R296 Repeated falls: Secondary | ICD-10-CM | POA: Diagnosis not present

## 2023-05-14 DIAGNOSIS — E782 Mixed hyperlipidemia: Secondary | ICD-10-CM | POA: Diagnosis not present

## 2023-05-14 DIAGNOSIS — I951 Orthostatic hypotension: Secondary | ICD-10-CM | POA: Diagnosis not present

## 2023-05-14 DIAGNOSIS — N189 Chronic kidney disease, unspecified: Secondary | ICD-10-CM | POA: Diagnosis not present

## 2023-05-14 DIAGNOSIS — E039 Hypothyroidism, unspecified: Secondary | ICD-10-CM | POA: Diagnosis not present

## 2023-05-14 DIAGNOSIS — R55 Syncope and collapse: Secondary | ICD-10-CM | POA: Diagnosis not present

## 2023-05-14 DIAGNOSIS — G629 Polyneuropathy, unspecified: Secondary | ICD-10-CM | POA: Diagnosis not present

## 2023-05-14 DIAGNOSIS — E538 Deficiency of other specified B group vitamins: Secondary | ICD-10-CM | POA: Diagnosis not present

## 2023-05-15 DIAGNOSIS — M6281 Muscle weakness (generalized): Secondary | ICD-10-CM | POA: Diagnosis not present

## 2023-05-15 DIAGNOSIS — G309 Alzheimer's disease, unspecified: Secondary | ICD-10-CM | POA: Diagnosis not present

## 2023-05-15 DIAGNOSIS — R279 Unspecified lack of coordination: Secondary | ICD-10-CM | POA: Diagnosis not present

## 2023-05-20 DIAGNOSIS — G309 Alzheimer's disease, unspecified: Secondary | ICD-10-CM | POA: Diagnosis not present

## 2023-05-20 DIAGNOSIS — M6281 Muscle weakness (generalized): Secondary | ICD-10-CM | POA: Diagnosis not present

## 2023-05-20 DIAGNOSIS — R279 Unspecified lack of coordination: Secondary | ICD-10-CM | POA: Diagnosis not present

## 2023-05-22 DIAGNOSIS — M6281 Muscle weakness (generalized): Secondary | ICD-10-CM | POA: Diagnosis not present

## 2023-05-22 DIAGNOSIS — G309 Alzheimer's disease, unspecified: Secondary | ICD-10-CM | POA: Diagnosis not present

## 2023-05-22 DIAGNOSIS — R279 Unspecified lack of coordination: Secondary | ICD-10-CM | POA: Diagnosis not present

## 2023-05-23 DIAGNOSIS — E119 Type 2 diabetes mellitus without complications: Secondary | ICD-10-CM | POA: Diagnosis not present

## 2023-05-23 DIAGNOSIS — D518 Other vitamin B12 deficiency anemias: Secondary | ICD-10-CM | POA: Diagnosis not present

## 2023-05-23 DIAGNOSIS — E782 Mixed hyperlipidemia: Secondary | ICD-10-CM | POA: Diagnosis not present

## 2023-05-23 DIAGNOSIS — Z79899 Other long term (current) drug therapy: Secondary | ICD-10-CM | POA: Diagnosis not present

## 2023-05-23 DIAGNOSIS — E038 Other specified hypothyroidism: Secondary | ICD-10-CM | POA: Diagnosis not present

## 2023-05-26 DIAGNOSIS — R279 Unspecified lack of coordination: Secondary | ICD-10-CM | POA: Diagnosis not present

## 2023-05-26 DIAGNOSIS — G309 Alzheimer's disease, unspecified: Secondary | ICD-10-CM | POA: Diagnosis not present

## 2023-05-26 DIAGNOSIS — M6281 Muscle weakness (generalized): Secondary | ICD-10-CM | POA: Diagnosis not present

## 2023-05-27 DIAGNOSIS — N189 Chronic kidney disease, unspecified: Secondary | ICD-10-CM | POA: Diagnosis not present

## 2023-05-27 DIAGNOSIS — I10 Essential (primary) hypertension: Secondary | ICD-10-CM | POA: Diagnosis not present

## 2023-05-27 DIAGNOSIS — E119 Type 2 diabetes mellitus without complications: Secondary | ICD-10-CM | POA: Diagnosis not present

## 2023-05-27 DIAGNOSIS — M6281 Muscle weakness (generalized): Secondary | ICD-10-CM | POA: Diagnosis not present

## 2023-05-27 DIAGNOSIS — I70223 Atherosclerosis of native arteries of extremities with rest pain, bilateral legs: Secondary | ICD-10-CM | POA: Diagnosis not present

## 2023-05-27 DIAGNOSIS — G309 Alzheimer's disease, unspecified: Secondary | ICD-10-CM | POA: Diagnosis not present

## 2023-05-27 DIAGNOSIS — E782 Mixed hyperlipidemia: Secondary | ICD-10-CM | POA: Diagnosis not present

## 2023-05-27 DIAGNOSIS — D518 Other vitamin B12 deficiency anemias: Secondary | ICD-10-CM | POA: Diagnosis not present

## 2023-05-27 DIAGNOSIS — I129 Hypertensive chronic kidney disease with stage 1 through stage 4 chronic kidney disease, or unspecified chronic kidney disease: Secondary | ICD-10-CM | POA: Diagnosis not present

## 2023-05-27 DIAGNOSIS — R279 Unspecified lack of coordination: Secondary | ICD-10-CM | POA: Diagnosis not present

## 2023-05-27 DIAGNOSIS — E039 Hypothyroidism, unspecified: Secondary | ICD-10-CM | POA: Diagnosis not present

## 2023-06-03 DIAGNOSIS — M2011 Hallux valgus (acquired), right foot: Secondary | ICD-10-CM | POA: Diagnosis not present

## 2023-06-03 DIAGNOSIS — B351 Tinea unguium: Secondary | ICD-10-CM | POA: Diagnosis not present

## 2023-06-03 DIAGNOSIS — M2012 Hallux valgus (acquired), left foot: Secondary | ICD-10-CM | POA: Diagnosis not present

## 2023-06-03 DIAGNOSIS — L600 Ingrowing nail: Secondary | ICD-10-CM | POA: Diagnosis not present

## 2023-06-06 ENCOUNTER — Emergency Department (HOSPITAL_COMMUNITY): Payer: Medicare Other

## 2023-06-06 ENCOUNTER — Encounter (HOSPITAL_COMMUNITY): Payer: Self-pay | Admitting: Emergency Medicine

## 2023-06-06 ENCOUNTER — Other Ambulatory Visit: Payer: Self-pay

## 2023-06-06 ENCOUNTER — Emergency Department (HOSPITAL_COMMUNITY)
Admission: EM | Admit: 2023-06-06 | Discharge: 2023-06-06 | Disposition: A | Discharge status: Other Acute Inpatient Hospital | Payer: Medicare Other | Attending: Emergency Medicine | Admitting: Emergency Medicine

## 2023-06-06 DIAGNOSIS — R600 Localized edema: Secondary | ICD-10-CM | POA: Diagnosis not present

## 2023-06-06 DIAGNOSIS — R0602 Shortness of breath: Secondary | ICD-10-CM | POA: Diagnosis not present

## 2023-06-06 DIAGNOSIS — R0689 Other abnormalities of breathing: Secondary | ICD-10-CM | POA: Diagnosis not present

## 2023-06-06 DIAGNOSIS — R6889 Other general symptoms and signs: Secondary | ICD-10-CM | POA: Diagnosis not present

## 2023-06-06 DIAGNOSIS — S79911A Unspecified injury of right hip, initial encounter: Secondary | ICD-10-CM | POA: Diagnosis not present

## 2023-06-06 DIAGNOSIS — S79912A Unspecified injury of left hip, initial encounter: Secondary | ICD-10-CM | POA: Diagnosis not present

## 2023-06-06 DIAGNOSIS — J984 Other disorders of lung: Secondary | ICD-10-CM | POA: Diagnosis not present

## 2023-06-06 DIAGNOSIS — I6782 Cerebral ischemia: Secondary | ICD-10-CM | POA: Diagnosis not present

## 2023-06-06 DIAGNOSIS — W19XXXA Unspecified fall, initial encounter: Secondary | ICD-10-CM | POA: Insufficient documentation

## 2023-06-06 DIAGNOSIS — M79652 Pain in left thigh: Secondary | ICD-10-CM | POA: Diagnosis not present

## 2023-06-06 DIAGNOSIS — R296 Repeated falls: Secondary | ICD-10-CM | POA: Diagnosis not present

## 2023-06-06 DIAGNOSIS — G309 Alzheimer's disease, unspecified: Secondary | ICD-10-CM | POA: Diagnosis not present

## 2023-06-06 DIAGNOSIS — M160 Bilateral primary osteoarthritis of hip: Secondary | ICD-10-CM | POA: Diagnosis not present

## 2023-06-06 DIAGNOSIS — E876 Hypokalemia: Secondary | ICD-10-CM | POA: Insufficient documentation

## 2023-06-06 DIAGNOSIS — Z743 Need for continuous supervision: Secondary | ICD-10-CM | POA: Diagnosis not present

## 2023-06-06 DIAGNOSIS — M79605 Pain in left leg: Secondary | ICD-10-CM | POA: Diagnosis not present

## 2023-06-06 DIAGNOSIS — M47816 Spondylosis without myelopathy or radiculopathy, lumbar region: Secondary | ICD-10-CM | POA: Diagnosis not present

## 2023-06-06 DIAGNOSIS — R410 Disorientation, unspecified: Secondary | ICD-10-CM | POA: Diagnosis not present

## 2023-06-06 DIAGNOSIS — F0390 Unspecified dementia without behavioral disturbance: Secondary | ICD-10-CM | POA: Diagnosis not present

## 2023-06-06 DIAGNOSIS — S0990XA Unspecified injury of head, initial encounter: Secondary | ICD-10-CM | POA: Diagnosis not present

## 2023-06-06 DIAGNOSIS — R627 Adult failure to thrive: Secondary | ICD-10-CM | POA: Diagnosis not present

## 2023-06-06 DIAGNOSIS — I700 Atherosclerosis of aorta: Secondary | ICD-10-CM | POA: Diagnosis not present

## 2023-06-06 LAB — BLOOD GAS, VENOUS
Acid-Base Excess: 9.8 mmol/L — ABNORMAL HIGH (ref 0.0–2.0)
Bicarbonate: 31.7 mmol/L — ABNORMAL HIGH (ref 20.0–28.0)
Drawn by: 65579
O2 Saturation: 19.9 %
Patient temperature: 36.4
pCO2, Ven: 32 mmHg — ABNORMAL LOW (ref 44–60)
pH, Ven: 7.6 — ABNORMAL HIGH (ref 7.25–7.43)
pO2, Ven: <31 mmHg — CL (ref 32–45)

## 2023-06-06 LAB — CBC WITH DIFFERENTIAL/PLATELET
Abs Immature Granulocytes: 0.03 10*3/uL (ref 0.00–0.07)
Basophils Absolute: 0 10*3/uL (ref 0.0–0.1)
Basophils Relative: 0 %
Eosinophils Absolute: 0 10*3/uL (ref 0.0–0.5)
Eosinophils Relative: 0 %
HCT: 35.9 % — ABNORMAL LOW (ref 36.0–46.0)
Hemoglobin: 11.2 g/dL — ABNORMAL LOW (ref 12.0–15.0)
Immature Granulocytes: 0 %
Lymphocytes Relative: 7 %
Lymphs Abs: 0.6 10*3/uL — ABNORMAL LOW (ref 0.7–4.0)
MCH: 28.6 pg (ref 26.0–34.0)
MCHC: 31.2 g/dL (ref 30.0–36.0)
MCV: 91.8 fL (ref 80.0–100.0)
Monocytes Absolute: 0.5 10*3/uL (ref 0.1–1.0)
Monocytes Relative: 5 %
Neutro Abs: 8.1 10*3/uL — ABNORMAL HIGH (ref 1.7–7.7)
Neutrophils Relative %: 88 %
Platelets: 578 10*3/uL — ABNORMAL HIGH (ref 150–400)
RBC: 3.91 MIL/uL (ref 3.87–5.11)
RDW: 13.8 % (ref 11.5–15.5)
WBC: 9.2 10*3/uL (ref 4.0–10.5)
nRBC: 0 % (ref 0.0–0.2)

## 2023-06-06 LAB — COMPREHENSIVE METABOLIC PANEL
ALT: 15 U/L (ref 0–44)
AST: 26 U/L (ref 15–41)
Albumin: 2.7 g/dL — ABNORMAL LOW (ref 3.5–5.0)
Alkaline Phosphatase: 91 U/L (ref 38–126)
Anion gap: 18 — ABNORMAL HIGH (ref 5–15)
BUN: 11 mg/dL (ref 8–23)
CO2: 22 mmol/L (ref 22–32)
Calcium: 8.2 mg/dL — ABNORMAL LOW (ref 8.9–10.3)
Chloride: 98 mmol/L (ref 98–111)
Creatinine, Ser: 0.6 mg/dL (ref 0.44–1.00)
GFR, Estimated: >60 mL/min (ref >60)
Glucose, Bld: 101 mg/dL — ABNORMAL HIGH (ref 70–99)
Potassium: 3.1 mmol/L — ABNORMAL LOW (ref 3.5–5.1)
Sodium: 138 mmol/L (ref 135–145)
Total Bilirubin: 1 mg/dL (ref 0.3–1.2)
Total Protein: 7.1 g/dL (ref 6.5–8.1)

## 2023-06-06 MED ORDER — POTASSIUM CHLORIDE 10 MEQ/100ML IV SOLN
10.0000 meq | Freq: Once | INTRAVENOUS | Status: AC
  Administered 2023-06-06: 10 meq via INTRAVENOUS
  Filled 2023-06-06: qty 100

## 2023-06-06 NOTE — ED Notes (Signed)
[facility] here at bedside to transport pt back to facility

## 2023-06-06 NOTE — ED Triage Notes (Signed)
Pt from [facility] of [place] with reports of unwitnessed fall. Pt reports she fell and was able to get herself up. Pt has pain in the left femur with palpation.

## 2023-06-06 NOTE — ED Provider Notes (Signed)
[place] [facility]GENCY DEPARTMENT AT [facility] Provider Note   CSN: [id number] Arrival date & time: 06/06/23  0831     History  Chief Complaint  Patient presents with   Shortness of Breath    April [name] is a 89 y.o. female.  Patient brought in from [facility] by EMS.  Patient had an unwitnessed fall.  They were called out for low blood pressure.  But when they got there her blood pressure was good but she was breathing very fast and they felt it was concern for shortness of breath.  Patient's left lower extremity is shorter than the right.  Patient appears to be in pain oxygen saturations are 100% on room air.  But she is tachypneic.       Home Medications Prior to Admission medications   Medication Sig Start Date End Date Taking? Authorizing Provider  aspirin 81 MG tablet Take 1 tablet (81 mg total) by mouth daily with breakfast. 11/25/22   Emokpae, Courage, MD  atorvastatin (LIPITOR) 20 MG tablet Take 20 mg by mouth daily. 11/20/22   [provider]  Ferrous Sulfate (IRON) 325 (65 Fe) MG TABS Take 1 tablet by mouth daily. 12/11/22   [provider]  folic acid (FOLVITE) 1 MG tablet Take 1 mg by mouth daily. 01/15/23   [provider]  levothyroxine (SYNTHROID) 75 MCG tablet Take 1 tablet (75 mcg total) by mouth daily before breakfast. 01/09/22   Daphine Deutscher, [name], FNP  lisinopril (ZESTRIL) 20 MG tablet Take 1 tablet (20 mg total) by mouth daily. 11/25/22   Shon Hale, MD  melatonin 3 MG TABS tablet Take 3 mg by mouth at bedtime.    [provider]  memantine (NAMENDA XR) 21 MG CP24 24 hr capsule TAKE (1) CAPSULE DAILY 01/09/22   Daphine Deutscher, [name], FNP  mirtazapine (REMERON) 7.5 MG tablet Take 7.5 mg by mouth at bedtime. 04/11/23   [provider]  QUEtiapine (SEROQUEL) 25 MG tablet Take 1 tablet (25 mg total) by mouth at bedtime. Patient taking differently: Take 25 mg by mouth 2 (two) times daily. 11/25/22    Shon Hale, MD  sertraline (ZOLOFT) 50 MG tablet Take 1 tablet (50 mg total) by mouth daily. 01/09/22   Daphine Deutscher [name], FNP      Allergies    Acid reducer [cimetidine] and Mevacor [lovastatin]    Review of Systems   Review of Systems  Unable to perform ROS: Dementia    Physical Exam Updated Vital Signs BP (!) 155/125   Pulse 84   Temp 97.6 F (36.4 C) (Oral)   Resp 15   SpO2 93%  Physical Exam Vitals and nursing note reviewed.  Constitutional:      General: She is in acute distress.     Appearance: Normal appearance. She is well-developed.  HENT:     Head: Normocephalic and atraumatic.     Mouth/Throat:     Mouth: Mucous membranes are moist.  Eyes:     Conjunctiva/sclera: Conjunctivae normal.     Pupils: Pupils are equal, round, and reactive to light.  Neck:     Comments: Patient moving her neck very freely does not seem to cause any discomfort. Cardiovascular:     Rate and Rhythm: Normal rate and regular rhythm.     Heart sounds: No murmur heard. Pulmonary:     Effort: No respiratory distress.     Breath sounds: Normal breath sounds. No wheezing, rhonchi or rales.     Comments: Tachypneic  Abdominal:     Palpations: Abdomen is soft.     Tenderness: There is no abdominal tenderness.  Musculoskeletal:        General: No swelling.     Cervical back: Neck supple. No rigidity.     Right lower leg: No edema.     Left lower leg: No edema.     Comments: Left leg shorter than right leg raising concerns for hip fracture.  But range of motion of both legs passively does not seem to cause any pain.  Skin:    General: Skin is warm and dry.     Capillary Refill: Capillary refill takes less than 2 seconds.  Neurological:     Mental Status: She is alert.     Comments: Patient confused but has dementia.  Patient moving upper extremities.  Limited range of motion of lower extremities.  Psychiatric:        Mood and Affect: Mood normal.     ED Results / Procedures  / Treatments   Labs (all labs ordered are listed, but only abnormal results are displayed) Labs Reviewed  CBC WITH DIFFERENTIAL/PLATELET - Abnormal; Notable for the following components:      Result Value   Hemoglobin 11.2 (*)    HCT 35.9 (*)    Platelets 578 (*)    Neutro Abs 8.1 (*)    Lymphs Abs 0.6 (*)    All other components within normal limits  COMPREHENSIVE METABOLIC PANEL - Abnormal; Notable for the following components:   Potassium 3.1 (*)    Glucose, Bld 101 (*)    Calcium 8.2 (*)    Albumin 2.7 (*)    Anion gap 18 (*)    All other components within normal limits  BLOOD GAS, VENOUS - Abnormal; Notable for the following components:   pH, Ven 7.6 (*)    pCO2, Ven 32 (*)    pO2, Ven <31 (*)    Bicarbonate 31.7 (*)    Acid-Base Excess 9.8 (*)    All other components within normal limits    EKG EKG Interpretation Date/Time:  Thursday June 06 2023 09:11:46 EDT Ventricular Rate:  70 PR Interval:    QRS Duration:  92 QT Interval:  464 QTC Calculation: 501 R Axis:   11  Text Interpretation: Normal sinus rhythm Interpretation limited secondary to artifact Borderline T abnormalities, anterior leads Prolonged QT interval Confirmed by Vanetta Mulders [phone]) on 06/06/2023 9:14:39 AM  Radiology CT Head Wo Contrast  Result Date: 06/06/2023 CLINICAL DATA:  Head trauma, minor (Age >= 65y) EXAM: CT HEAD WITHOUT CONTRAST TECHNIQUE: Contiguous axial images were obtained from the base of the skull through the vertex without intravenous contrast. RADIATION DOSE REDUCTION: This exam was performed according to the departmental dose-optimization program which includes automated exposure control, adjustment of the mA and/or kV according to patient size and/or use of iterative reconstruction technique. COMPARISON:  CT head 05/12/23 FINDINGS: Brain: No evidence of acute infarction, hemorrhage, hydrocephalus, extra-axial collection or mass lesion/mass effect. There is sequela of  mild-to-moderate chronic microvascular ischemic change Vascular: No hyperdense vessel or unexpected calcification. Skull: Normal. Negative for fracture or focal lesion. Sinuses/Orbits: Trace right mastoid effusion. No middle ear effusion. There is near-complete opacification of the right EAC, likely secondary to impacted cerumen. Paranasal sinuses are clear. Bilateral lens replacement. Orbits are otherwise unremarkable Other: None. IMPRESSION: No CT evidence of intracranial injury Electronically Signed   By: Lorenza Cambridge M.D.   On: 06/06/2023 11:34   CT Hip  Left Wo Contrast  Result Date: 06/06/2023 CLINICAL DATA:  Hip trauma, fracture suspected. Unwitnessed fall. Reported left femur pain with palpation. EXAM: CT OF THE RIGHT HIP WITHOUT CONTRAST; CT OF THE LEFT HIP WITHOUT CONTRAST TECHNIQUE: Multidetector CT imaging of the each hip was performed separately according to the standard protocol. Multiplanar CT image reconstructions were also generated. RADIATION DOSE REDUCTION: This exam was performed according to the departmental dose-optimization program which includes automated exposure control, adjustment of the mA and/or kV according to patient size and/or use of iterative reconstruction technique. COMPARISON:  Pelvic radiographs same date. FINDINGS: Bones/Joint/Cartilage No evidence of acute fracture, dislocation or femoral head osteonecrosis. There are advanced right and moderate left hip degenerative changes. Mild degenerative changes at the symphysis pubis and sacroiliac joints bilaterally. Lower lumbar spondylosis noted. No significant hip joint effusions. Ligaments Suboptimally assessed by CT. Muscles and Tendons Chronic tendinosis and enthesophyte formation at the common hamstring and gluteus tendon attachments bilaterally. No full-thickness tendon tear or tendon retraction identified. No focal muscular atrophy. Soft tissues Mild nonspecific subcutaneous edema superficial to the ischial tuberosities  bilaterally. No focal hematoma, other fluid collection or soft tissue emphysema. There is iliofemoral atherosclerosis with mild sigmoid diverticulosis. The urinary bladder is mildly distended. IMPRESSION: 1. No evidence of acute fracture, dislocation or femoral head osteonecrosis. 2. Advanced right and moderate left hip degenerative changes. 3. Chronic tendinosis and enthesophyte formation at the common hamstring and gluteus tendon attachments bilaterally. No full-thickness tendon tear or tendon retraction identified. 4. No focal periarticular hematoma or other fluid collection identified. Electronically Signed   By: Carey Bullocks M.D.   On: 06/06/2023 11:27   CT Hip Right Wo Contrast  Result Date: 06/06/2023 CLINICAL DATA:  Hip trauma, fracture suspected. Unwitnessed fall. Reported left femur pain with palpation. EXAM: CT OF THE RIGHT HIP WITHOUT CONTRAST; CT OF THE LEFT HIP WITHOUT CONTRAST TECHNIQUE: Multidetector CT imaging of the each hip was performed separately according to the standard protocol. Multiplanar CT image reconstructions were also generated. RADIATION DOSE REDUCTION: This exam was performed according to the departmental dose-optimization program which includes automated exposure control, adjustment of the mA and/or kV according to patient size and/or use of iterative reconstruction technique. COMPARISON:  Pelvic radiographs same date. FINDINGS: Bones/Joint/Cartilage No evidence of acute fracture, dislocation or femoral head osteonecrosis. There are advanced right and moderate left hip degenerative changes. Mild degenerative changes at the symphysis pubis and sacroiliac joints bilaterally. Lower lumbar spondylosis noted. No significant hip joint effusions. Ligaments Suboptimally assessed by CT. Muscles and Tendons Chronic tendinosis and enthesophyte formation at the common hamstring and gluteus tendon attachments bilaterally. No full-thickness tendon tear or tendon retraction identified. No  focal muscular atrophy. Soft tissues Mild nonspecific subcutaneous edema superficial to the ischial tuberosities bilaterally. No focal hematoma, other fluid collection or soft tissue emphysema. There is iliofemoral atherosclerosis with mild sigmoid diverticulosis. The urinary bladder is mildly distended. IMPRESSION: 1. No evidence of acute fracture, dislocation or femoral head osteonecrosis. 2. Advanced right and moderate left hip degenerative changes. 3. Chronic tendinosis and enthesophyte formation at the common hamstring and gluteus tendon attachments bilaterally. No full-thickness tendon tear or tendon retraction identified. 4. No focal periarticular hematoma or other fluid collection identified. Electronically Signed   By: Carey Bullocks M.D.   On: 06/06/2023 11:27   DG Pelvis Portable  Result Date: 06/06/2023 CLINICAL DATA:  Fall. Left thigh pain and shortness of breath. EXAM: PORTABLE PELVIS 1-2 VIEWS COMPARISON:  Radiograph 05/12/2023 FINDINGS: The bones appear demineralized.  There is no evidence of acute pelvic fracture or dislocation. Chronic right-greater-than-left hip arthropathy appears unchanged. There is mild spondylosis within the lower lumbar spine. The soft tissues appear unremarkable. IMPRESSION: No evidence of acute pelvic fracture or dislocation. Chronic right-greater-than-left hip arthropathy. Consider dedicated left hip/femur radiographs. Electronically Signed   By: Carey Bullocks M.D.   On: 06/06/2023 09:08   DG Chest Port 1 View  Result Date: 06/06/2023 CLINICAL DATA:  Fall.  Left thigh pain and shortness of breath. EXAM: PORTABLE CHEST 1 VIEW COMPARISON:  Radiograph 05/12/2023 and 04/22/2023. FINDINGS: 0853 hours. The heart size and mediastinal contours are stable with aortic atherosclerosis. Stable mild chronic basilar lung disease without evidence of superimposed infiltrate, pleural effusion or pneumothorax. No acute fractures are identified. Telemetry leads overlie the chest.  IMPRESSION: No evidence of acute chest injury. Stable mild chronic basilar lung disease. Electronically Signed   By: Carey Bullocks M.D.   On: 06/06/2023 09:06    Procedures Procedures    Medications Ordered in ED Medications  potassium chloride 10 mEq in 100 mL IVPB (0 mEq Intravenous Stopped 06/06/23 1213)    ED Course/ Medical Decision Making/ A&P                             Medical Decision Making Amount and/or Complexity of Data Reviewed Labs: ordered. Radiology: ordered.  Risk Prescription drug management.   Patient's workup here venous blood gas pH 7.6 goes along with her hyperventilating pCO2 was 32.  No leukocytosis hemoglobin was good platelets are elevated at 578.  Complete metabolic panel significant for potassium of 3.1 and patient did receive 1 dose of IV potassium.  Liver function tests were normal.  CT head without any acute findings and CTs of left and right hips without any acute findings.  Patient's blood pressure was fine here oxygen sats were excellent here on room air patient did state that when she first got here that she was scared and this could have been why she was hyperventilating.  She seemed to settle down and everything seems fine patient stable for discharge back to nursing facility.  CRITICAL CARE Performed by: Vanetta Mulders Total critical care time: 35 minutes Critical care time was exclusive of separately billable procedures and treating other patients. Critical care was necessary to treat or prevent imminent or life-threatening deterioration. Critical care was time spent personally by me on the following activities: development of treatment plan with patient and/or surrogate as well as nursing, discussions with consultants, evaluation of patient's response to treatment, examination of patient, obtaining history from patient or surrogate, ordering and performing treatments and interventions, ordering and review of laboratory studies, ordering and  review of radiographic studies, pulse oximetry and re-evaluation of patient's condition.  For IV potassium.  And initial evaluation for respiratory distress. Final Clinical Impression(s) / ED Diagnoses Final diagnoses:  Fall, initial encounter  Hypokalemia    Rx / DC Orders ED Discharge Orders     None         Vanetta Mulders, MD 06/06/23 1318

## 2023-06-06 NOTE — Discharge Instructions (Signed)
Workup here in the emergency department showed no evidence of any fractures or injuries of significance from the fall.  Patient's blood pressure has been fine here and patient's oxygen sats have also been fine here.  Patient now feeling much more comfortable.  Patient did seem to be very anxious when she first arrived.  Patient stable for discharge back to facility.

## 2023-06-08 DIAGNOSIS — I10 Essential (primary) hypertension: Secondary | ICD-10-CM | POA: Diagnosis not present

## 2023-06-10 ENCOUNTER — Emergency Department (HOSPITAL_COMMUNITY): Payer: Medicare Other

## 2023-06-10 ENCOUNTER — Encounter (HOSPITAL_COMMUNITY): Payer: Self-pay | Admitting: *Deleted

## 2023-06-10 ENCOUNTER — Emergency Department (HOSPITAL_COMMUNITY)
Admission: EM | Admit: 2023-06-10 | Discharge: 2023-06-10 | Disposition: A | Discharge status: Home / Self Care | Payer: Medicare Other | Attending: Emergency Medicine | Admitting: Emergency Medicine

## 2023-06-10 ENCOUNTER — Other Ambulatory Visit: Payer: Self-pay

## 2023-06-10 DIAGNOSIS — Y92129 Unspecified place in nursing home as the place of occurrence of the external cause: Secondary | ICD-10-CM | POA: Diagnosis not present

## 2023-06-10 DIAGNOSIS — F0280 Dementia in other diseases classified elsewhere without behavioral disturbance: Secondary | ICD-10-CM | POA: Insufficient documentation

## 2023-06-10 DIAGNOSIS — Z7982 Long term (current) use of aspirin: Secondary | ICD-10-CM | POA: Insufficient documentation

## 2023-06-10 DIAGNOSIS — W19XXXA Unspecified fall, initial encounter: Secondary | ICD-10-CM | POA: Insufficient documentation

## 2023-06-10 DIAGNOSIS — Z743 Need for continuous supervision: Secondary | ICD-10-CM | POA: Diagnosis not present

## 2023-06-10 DIAGNOSIS — I10 Essential (primary) hypertension: Secondary | ICD-10-CM | POA: Diagnosis not present

## 2023-06-10 DIAGNOSIS — Z79899 Other long term (current) drug therapy: Secondary | ICD-10-CM | POA: Insufficient documentation

## 2023-06-10 DIAGNOSIS — J432 Centrilobular emphysema: Secondary | ICD-10-CM | POA: Diagnosis not present

## 2023-06-10 DIAGNOSIS — S199XXA Unspecified injury of neck, initial encounter: Secondary | ICD-10-CM | POA: Diagnosis not present

## 2023-06-10 DIAGNOSIS — Z043 Encounter for examination and observation following other accident: Secondary | ICD-10-CM | POA: Diagnosis not present

## 2023-06-10 DIAGNOSIS — R5381 Other malaise: Secondary | ICD-10-CM | POA: Diagnosis not present

## 2023-06-10 DIAGNOSIS — Z7401 Bed confinement status: Secondary | ICD-10-CM | POA: Diagnosis not present

## 2023-06-10 DIAGNOSIS — G309 Alzheimer's disease, unspecified: Secondary | ICD-10-CM | POA: Diagnosis not present

## 2023-06-10 DIAGNOSIS — S0990XA Unspecified injury of head, initial encounter: Secondary | ICD-10-CM | POA: Diagnosis not present

## 2023-06-10 DIAGNOSIS — E039 Hypothyroidism, unspecified: Secondary | ICD-10-CM | POA: Diagnosis not present

## 2023-06-10 DIAGNOSIS — Z87891 Personal history of nicotine dependence: Secondary | ICD-10-CM | POA: Insufficient documentation

## 2023-06-10 DIAGNOSIS — Z8659 Personal history of other mental and behavioral disorders: Secondary | ICD-10-CM

## 2023-06-10 DIAGNOSIS — R531 Weakness: Secondary | ICD-10-CM | POA: Diagnosis not present

## 2023-06-10 NOTE — ED Notes (Signed)
Called to [facility] in [place] that patient checked out and scans were unremarkable, report given to [facility] -

## 2023-06-10 NOTE — ED Provider Notes (Signed)
[place] EMERGENCY DEPARTMENT AT [facility] Provider Note  CSN: [id number] Arrival date & time: 06/10/23 1218  Chief Complaint(s) Fall  HPI April [name] is a 89 y.o. female with past medical history as below, significant for dementia, hld, htn, arthritis who presents to the ED with complaint of fall. She resides as SNF, report of unwitnessed fall by pt but on arrival she denies any fall. She has no pain, no weakness to extremities, no n/v, no headache, no back pain. Feels that she is at her baseline currently.  Level 5 caveat ams  Past Medical History Past Medical History:  Diagnosis Date   Alzheimer disease (HCC)    Arthritis    Cognitive changes    Hyperlipidemia    Hypertension    Thyroid disease    Patient Active Problem List   Diagnosis Date Noted   HTN (hypertension) 05/08/2023   Orthostatic hypotension 04/17/2023   Mitral regurgitation 02/12/2023   Tricuspid regurgitation 02/12/2023   UTI (urinary tract infection) 11/25/2022   Syncope and collapse 11/25/2022   Sinus bradycardia 11/25/2022   Closed dislocation of interphalangeal joint of left thumb 08/01/2020   Laceration of left thumb without foreign body without damage to nail 07/21/2020   Late onset Alzheimer's disease without behavioral disturbance (HCC) 02/24/2020   Insomnia 10/12/2014   Hyperlipidemia with target LDL less than 100 08/26/2013   Hypothyroidism 08/26/2013   Hypertension 08/26/2013   Diverticulosis 08/26/2013   GAD (generalized anxiety disorder) 08/26/2013   Neuropathy of both feet 08/26/2013   Home Medication(s) Prior to Admission medications   Medication Sig Start Date End Date Taking? Authorizing Provider  aspirin 81 MG tablet Take 1 tablet (81 mg total) by mouth daily with breakfast. 11/25/22   Emokpae, Courage, MD  atorvastatin (LIPITOR) 20 MG tablet Take 20 mg by mouth daily. 11/20/22   [provider]  Ferrous Sulfate (IRON) 325 (65 Fe) MG TABS Take 1 tablet by mouth  daily. 12/11/22   [provider]  folic acid (FOLVITE) 1 MG tablet Take 1 mg by mouth daily. 01/15/23   [provider]  levothyroxine (SYNTHROID) 75 MCG tablet Take 1 tablet (75 mcg total) by mouth daily before breakfast. 01/09/22   Daphine Deutscher, [name], FNP  lisinopril (ZESTRIL) 20 MG tablet Take 1 tablet (20 mg total) by mouth daily. 11/25/22   Shon Hale, MD  melatonin 3 MG TABS tablet Take 3 mg by mouth at bedtime.    [provider]  memantine (NAMENDA XR) 21 MG CP24 24 hr capsule TAKE (1) CAPSULE DAILY 01/09/22   Daphine Deutscher, [name], FNP  mirtazapine (REMERON) 7.5 MG tablet Take 7.5 mg by mouth at bedtime. 04/11/23   [provider]  QUEtiapine (SEROQUEL) 25 MG tablet Take 1 tablet (25 mg total) by mouth at bedtime. Patient taking differently: Take 25 mg by mouth 2 (two) times daily. 11/25/22   Shon Hale, MD  sertraline (ZOLOFT) 50 MG tablet Take 1 tablet (50 mg total) by mouth daily. 01/09/22   Bennie Pierini, FNP  Past Surgical History Past Surgical History:  Procedure Laterality Date   FOOT SURGERY     Arthritis    HAND SURGERY     Arthritis    thumb surgery Left    [facility]    VAGINAL HYSTERECTOMY     Family History Family History  Problem Relation Age of Onset   Pneumonia Father    Alcohol abuse Father    Dementia Sister     Social History Social History   Tobacco Use   Smoking status: Former   Smokeless tobacco: Never  Building services engineer Use: Never used  Substance Use Topics   Alcohol use: Not Currently    Alcohol/week: 1.0 standard drink of alcohol    Types: 1 Glasses of wine per week    Comment: socially   Drug use: No   Allergies Acid reducer [cimetidine] and Mevacor [lovastatin]  Review of Systems Review of Systems  Unable to perform ROS: Dementia    Physical  Exam Vital Signs  I have reviewed the triage vital signs BP (!) 153/68   Pulse 66   Temp 97.6 F (36.4 C) (Oral)   Resp 16   SpO2 98%  Physical Exam Vitals and nursing note reviewed.  Constitutional:      General: She is not in acute distress.    Appearance: Normal appearance.  HENT:     Head: Normocephalic and atraumatic. No raccoon eyes, Battle's sign, right periorbital erythema or left periorbital erythema.     Right Ear: External ear normal.     Left Ear: External ear normal.     Nose: Nose normal.     Mouth/Throat:     Mouth: Mucous membranes are moist.  Eyes:     General: No scleral icterus.       Right eye: No discharge.        Left eye: No discharge.     Extraocular Movements: Extraocular movements intact.     Pupils: Pupils are equal, round, and reactive to light.  Cardiovascular:     Rate and Rhythm: Normal rate and regular rhythm.     Pulses: Normal pulses.     Heart sounds: Normal heart sounds.  Pulmonary:     Effort: Pulmonary effort is normal. No respiratory distress.     Breath sounds: Normal breath sounds.  Abdominal:     General: Abdomen is flat.     Palpations: Abdomen is soft.     Tenderness: There is no abdominal tenderness.  Musculoskeletal:        General: Normal range of motion.     Right lower leg: No edema.     Left lower leg: No edema.  Skin:    General: Skin is warm and dry.     Capillary Refill: Capillary refill takes less than 2 seconds.  Neurological:     Mental Status: She is alert.  Psychiatric:        Mood and Affect: Mood normal.        Behavior: Behavior normal.     ED Results and Treatments Labs (all labs ordered are listed, but only abnormal results are displayed) Labs Reviewed - No data to display  Radiology CT Head Wo Contrast  Result Date: 06/10/2023 CLINICAL DATA:  Head trauma, minor (Age >= 65y);  Neck trauma (Age >= 65y). Un witnessed fall. EXAM: CT HEAD WITHOUT CONTRAST CT CERVICAL SPINE WITHOUT CONTRAST TECHNIQUE: Multidetector CT imaging of the head and cervical spine was performed following the standard protocol without intravenous contrast. Multiplanar CT image reconstructions of the cervical spine were also generated. RADIATION DOSE REDUCTION: This exam was performed according to the departmental dose-optimization program which includes automated exposure control, adjustment of the mA and/or kV according to patient size and/or use of iterative reconstruction technique. COMPARISON:  CT head 06/06/2023.  CT cervical spine 05/12/2023. FINDINGS: CT HEAD FINDINGS Brain: There is no evidence of an acute infarct, intracranial hemorrhage, mass, midline shift, or extra-axial fluid collection. Mild cerebral atrophy is within normal limits for age. Cerebral white matter hypodensities are nonspecific but compatible with mild chronic small vessel ischemic disease, unchanged. Vascular: Calcified atherosclerosis at the skull base. No hyperdense vessel. Skull: No acute fracture or suspicious osseous lesion. Sinuses/Orbits: Visualized paranasal sinuses are clear. Small right mastoid effusion. Persistent near complete opacification of the right external auditory canal. Bilateral cataract extraction. Other: None. CT CERVICAL SPINE FINDINGS Alignment: Straightening of the normal cervical lordosis. Minimal stepwise anterolisthesis of C4 on C5, C5 on C6, C6 on C7, and C7 on T1, likely degenerative. Skull base and vertebrae: No acute fracture or suspicious osseous lesion. Partial interbody ankylosis at C2-3. Soft tissues and spinal canal: No prevertebral fluid or swelling. No visible canal hematoma. Disc levels: Mild-to-moderate multilevel disc space narrowing, greatest at C5-6 on the right. Widespread advanced facet arthrosis, most severe on the right in the mid to lower cervical spine. Severe left neural foraminal stenosis  at C3-4 due to uncovertebral and facet spurring. No evidence of high-grade spinal canal stenosis. Upper chest: Centrilobular emphysema and pleuroparenchymal scarring in the included right lung apex. Other: Prominent atherosclerotic calcification at the right greater than left carotid bifurcations. IMPRESSION: 1. No evidence of acute intracranial abnormality or acute cervical spine fracture. 2.  Emphysema (ICD10-J43.9). Electronically Signed   By: Sebastian Ache M.D.   On: 06/10/2023 15:59   CT Cervical Spine Wo Contrast  Result Date: 06/10/2023 CLINICAL DATA:  Head trauma, minor (Age >= 65y); Neck trauma (Age >= 65y). Un witnessed fall. EXAM: CT HEAD WITHOUT CONTRAST CT CERVICAL SPINE WITHOUT CONTRAST TECHNIQUE: Multidetector CT imaging of the head and cervical spine was performed following the standard protocol without intravenous contrast. Multiplanar CT image reconstructions of the cervical spine were also generated. RADIATION DOSE REDUCTION: This exam was performed according to the departmental dose-optimization program which includes automated exposure control, adjustment of the mA and/or kV according to patient size and/or use of iterative reconstruction technique. COMPARISON:  CT head 06/06/2023.  CT cervical spine 05/12/2023. FINDINGS: CT HEAD FINDINGS Brain: There is no evidence of an acute infarct, intracranial hemorrhage, mass, midline shift, or extra-axial fluid collection. Mild cerebral atrophy is within normal limits for age. Cerebral white matter hypodensities are nonspecific but compatible with mild chronic small vessel ischemic disease, unchanged. Vascular: Calcified atherosclerosis at the skull base. No hyperdense vessel. Skull: No acute fracture or suspicious osseous lesion. Sinuses/Orbits: Visualized paranasal sinuses are clear. Small right mastoid effusion. Persistent near complete opacification of the right external auditory canal. Bilateral cataract extraction. Other: None. CT CERVICAL SPINE  FINDINGS Alignment: Straightening of the normal cervical lordosis. Minimal stepwise anterolisthesis of C4 on C5, C5 on C6, C6 on C7, and C7 on T1, likely degenerative.  Skull base and vertebrae: No acute fracture or suspicious osseous lesion. Partial interbody ankylosis at C2-3. Soft tissues and spinal canal: No prevertebral fluid or swelling. No visible canal hematoma. Disc levels: Mild-to-moderate multilevel disc space narrowing, greatest at C5-6 on the right. Widespread advanced facet arthrosis, most severe on the right in the mid to lower cervical spine. Severe left neural foraminal stenosis at C3-4 due to uncovertebral and facet spurring. No evidence of high-grade spinal canal stenosis. Upper chest: Centrilobular emphysema and pleuroparenchymal scarring in the included right lung apex. Other: Prominent atherosclerotic calcification at the right greater than left carotid bifurcations. IMPRESSION: 1. No evidence of acute intracranial abnormality or acute cervical spine fracture. 2.  Emphysema (ICD10-J43.9). Electronically Signed   By: Sebastian Ache M.D.   On: 06/10/2023 15:59    Pertinent labs & imaging results that were available during my care of the patient were reviewed by me and considered in my medical decision making (see MDM for details).  Medications Ordered in ED Medications - No data to display                                                                                                                                   Procedures Procedures  (including critical care time)  Medical Decision Making / ED Course    Medical Decision Making:    LAYLAH LANGFELDT is a 89 y.o. female with past medical history as below, significant for dementia, hld, htn, arthritis who presents to the ED with complaint of fall. . The complaint involves an extensive differential diagnosis and also carries with it a high risk of complications and morbidity.  Serious etiology was considered. Ddx includes but is not  limited to: Differential diagnoses for head trauma includes subdural hematoma, epidural hematoma, acute concussion, traumatic subarachnoid hemorrhage, cerebral contusions, etc.   Complete initial physical exam performed, notably the patient  was nad, resting comfortably, no distress.    Reviewed and confirmed nursing documentation for past medical history, family history, social history.  Vital signs reviewed.       She has no complaints, she reported fall initial EMS.  She has dementia and is poor historian.   She thinks she may have hit her head, she is unsure.  Will get CT head and C-spine.  These were negative.  She feels back to normal.  No pain at all.  Tolerating p.o.  Stable discharge back to SNF    The patient improved significantly and was discharged in stable condition. Detailed discussions were had with the patient regarding current findings, and need for close f/u with PCP or on call doctor. The patient has been instructed to return immediately if the symptoms worsen in any way for re-evaluation. Patient verbalized understanding and is in agreement with current care plan. All questions answered prior to discharge.   Additional history obtained: -Additional history obtained from ems -External records from outside source obtained and  reviewed including: Chart review including previous notes, labs, imaging, consultation notes including prior ED visits, prior labs and ed visits, home medications   Lab Tests: na  EKG   EKG Interpretation Date/Time:    Ventricular Rate:    PR Interval:    QRS Duration:    QT Interval:    QTC Calculation:   R Axis:      Text Interpretation:           Imaging Studies ordered: I ordered imaging studies including CTH CTCS I independently visualized the following imaging with scope of interpretation limited to determining acute life threatening conditions related to emergency care; findings noted above, significant for no ich I  independently visualized and interpreted imaging. I agree with the radiologist interpretation   Medicines ordered and prescription drug management: No orders of the defined types were placed in this encounter.   -I have reviewed the patients home medicines and have made adjustments as needed   Consultations Obtained: na   Cardiac Monitoring: The patient was maintained on a cardiac monitor.  I personally viewed and interpreted the cardiac monitored which showed an underlying rhythm of: NSR  Social Determinants of Health:  Diagnosis or treatment significantly limited by social determinants of health: former smoker   Reevaluation: After the interventions noted above, I reevaluated the patient and found that they have stayed the same  Co morbidities that complicate the patient evaluation  Past Medical History:  Diagnosis Date   Alzheimer disease (HCC)    Arthritis    Cognitive changes    Hyperlipidemia    Hypertension    Thyroid disease       Dispostion: Disposition decision including need for hospitalization was considered, and patient discharged from emergency department.    Final Clinical Impression(s) / ED Diagnoses Final diagnoses:  Fall, initial encounter  History of dementia     This chart was dictated using voice recognition software.  Despite best efforts to proofread,  errors can occur which can change the documentation meaning.    Tanda Rockers A, DO 06/10/23 1606

## 2023-06-10 NOTE — Discharge Instructions (Signed)
It was a pleasure caring for you today in the emergency department. ° °Please return to the emergency department for any worsening or worrisome symptoms. ° ° °

## 2023-06-10 NOTE — ED Triage Notes (Signed)
Pt BIB [facility] for c/o unwitnessed fall; pt was found on floor by staff; pt denies any loc and states she was reaching for something when she fell  Pt denies any pain

## 2023-06-11 DIAGNOSIS — R2689 Other abnormalities of gait and mobility: Secondary | ICD-10-CM | POA: Diagnosis not present

## 2023-06-11 DIAGNOSIS — G309 Alzheimer's disease, unspecified: Secondary | ICD-10-CM | POA: Diagnosis not present

## 2023-06-11 DIAGNOSIS — I440 Atrioventricular block, first degree: Secondary | ICD-10-CM | POA: Diagnosis not present

## 2023-06-11 DIAGNOSIS — I951 Orthostatic hypotension: Secondary | ICD-10-CM | POA: Diagnosis not present

## 2023-06-11 DIAGNOSIS — E876 Hypokalemia: Secondary | ICD-10-CM | POA: Diagnosis not present

## 2023-06-11 DIAGNOSIS — R627 Adult failure to thrive: Secondary | ICD-10-CM | POA: Diagnosis not present

## 2023-06-11 DIAGNOSIS — R55 Syncope and collapse: Secondary | ICD-10-CM | POA: Diagnosis not present

## 2023-06-11 DIAGNOSIS — R296 Repeated falls: Secondary | ICD-10-CM | POA: Diagnosis not present

## 2023-06-13 ENCOUNTER — Other Ambulatory Visit: Payer: Self-pay

## 2023-06-13 ENCOUNTER — Encounter (HOSPITAL_COMMUNITY): Payer: Self-pay

## 2023-06-13 ENCOUNTER — Emergency Department (HOSPITAL_COMMUNITY)
Admission: EM | Admit: 2023-06-13 | Discharge: 2023-06-14 | Disposition: A | Discharge status: Skilled Nursing Facility | Payer: Medicare Other | Attending: Emergency Medicine | Admitting: Emergency Medicine

## 2023-06-13 ENCOUNTER — Emergency Department (HOSPITAL_COMMUNITY): Payer: Medicare Other

## 2023-06-13 DIAGNOSIS — G309 Alzheimer's disease, unspecified: Secondary | ICD-10-CM | POA: Insufficient documentation

## 2023-06-13 DIAGNOSIS — W19XXXA Unspecified fall, initial encounter: Secondary | ICD-10-CM | POA: Diagnosis not present

## 2023-06-13 DIAGNOSIS — F0280 Dementia in other diseases classified elsewhere without behavioral disturbance: Secondary | ICD-10-CM | POA: Insufficient documentation

## 2023-06-13 DIAGNOSIS — Z79899 Other long term (current) drug therapy: Secondary | ICD-10-CM | POA: Diagnosis not present

## 2023-06-13 DIAGNOSIS — Z7982 Long term (current) use of aspirin: Secondary | ICD-10-CM | POA: Diagnosis not present

## 2023-06-13 DIAGNOSIS — S0990XA Unspecified injury of head, initial encounter: Secondary | ICD-10-CM | POA: Diagnosis not present

## 2023-06-13 DIAGNOSIS — Z743 Need for continuous supervision: Secondary | ICD-10-CM | POA: Diagnosis not present

## 2023-06-13 DIAGNOSIS — I10 Essential (primary) hypertension: Secondary | ICD-10-CM | POA: Insufficient documentation

## 2023-06-13 DIAGNOSIS — R6889 Other general symptoms and signs: Secondary | ICD-10-CM | POA: Diagnosis not present

## 2023-06-13 DIAGNOSIS — R404 Transient alteration of awareness: Secondary | ICD-10-CM | POA: Diagnosis not present

## 2023-06-13 NOTE — Discharge Instructions (Addendum)
Your CT imaging today is negative for acute injuries.

## 2023-06-13 NOTE — ED Notes (Signed)
Attempted report to [facility] without success

## 2023-06-13 NOTE — ED Notes (Signed)
Pt relatives Marylu Lund and Casimiro Needle) called to inquire about transport back to [facility] Did not get an answer for either number. Left VM on Janet's phone.

## 2023-06-13 NOTE — ED Provider Notes (Signed)
[place] EMERGENCY DEPARTMENT AT [facility] Provider Note   CSN: [id number] Arrival date & time: 06/13/23  1712     History  Chief Complaint  Patient presents with   April [name] is a 89 y.o. female presenting from a local nursing home for evaluation of presumed fall.  This is a patient who had actually has frequent falls and is seen here for this complaint most recently on July 1.  She has a history of Alzheimer's disease, neuropathy, hypertension hyperlipidemia and mitral regurg.  She was found sitting on the ground in her nursing home with her head against the wall, it was not a witnessed fall.  She has no complaints of pain or injury.  She presents in a c-collar.  Level 5 caveat given history of dementia.  The history is provided by the patient and the nursing home. The history is limited by the condition of the patient.       Home Medications Prior to Admission medications   Medication Sig Start Date End Date Taking? Authorizing Provider  aspirin 81 MG tablet Take 1 tablet (81 mg total) by mouth daily with breakfast. 11/25/22   Emokpae, Courage, MD  atorvastatin (LIPITOR) 20 MG tablet Take 20 mg by mouth daily. 11/20/22   [provider]  Ferrous Sulfate (IRON) 325 (65 Fe) MG TABS Take 1 tablet by mouth daily. 12/11/22   [provider]  folic acid (FOLVITE) 1 MG tablet Take 1 mg by mouth daily. 01/15/23   [provider]  levothyroxine (SYNTHROID) 75 MCG tablet Take 1 tablet (75 mcg total) by mouth daily before breakfast. 01/09/22   Daphine Deutscher, [name], FNP  lisinopril (ZESTRIL) 20 MG tablet Take 1 tablet (20 mg total) by mouth daily. 11/25/22   Shon Hale, MD  melatonin 3 MG TABS tablet Take 3 mg by mouth at bedtime.    [provider]  memantine (NAMENDA XR) 21 MG CP24 24 hr capsule TAKE (1) CAPSULE DAILY 01/09/22   Daphine Deutscher, [name], FNP  mirtazapine (REMERON) 7.5 MG tablet Take 7.5 mg by mouth at bedtime. 04/11/23    [provider]  QUEtiapine (SEROQUEL) 25 MG tablet Take 1 tablet (25 mg total) by mouth at bedtime. Patient taking differently: Take 25 mg by mouth 2 (two) times daily. 11/25/22   Shon Hale, MD  sertraline (ZOLOFT) 50 MG tablet Take 1 tablet (50 mg total) by mouth daily. 01/09/22   Daphine Deutscher [name], FNP      Allergies    Acid reducer [cimetidine] and Mevacor [lovastatin]    Review of Systems   Review of Systems  Unable to perform ROS: Dementia  All other systems reviewed and are negative.   Physical Exam Updated Vital Signs BP 124/78 (BP Location: Left Arm)   Pulse 88   Temp (!) 97.5 F (36.4 C) (Oral)   Resp 18   Ht 5\' 4"  (1.626 m)   Wt 51.4 kg   SpO2 98%   BMI 19.45 kg/m  Physical Exam Vitals and nursing note reviewed.  Constitutional:      Appearance: She is well-developed.  HENT:     Head: Normocephalic and atraumatic.  Eyes:     Extraocular Movements: Extraocular movements intact.     Conjunctiva/sclera: Conjunctivae normal.  Cardiovascular:     Rate and Rhythm: Normal rate and regular rhythm.     Heart sounds: Normal heart sounds.  Pulmonary:     Effort: Pulmonary effort is normal.  Breath sounds: Normal breath sounds. No wheezing.  Abdominal:     General: Bowel sounds are normal.     Palpations: Abdomen is soft.     Tenderness: There is no abdominal tenderness.  Musculoskeletal:        General: No deformity. Normal range of motion.     Cervical back: Normal range of motion.     Comments: No obvious pain or discomfort with range of motion, she can freely flex and extend at her shoulders elbows and wrists, she does not want to bend her knees but is nontender to palpation with passive range of motion no pain, no hip pain or ankle pain with palpation.  Skin:    General: Skin is warm and dry.     Findings: No lesion.  Neurological:     Mental Status: She is alert. She is disoriented.  Psychiatric:     Comments: Cooperative     ED  Results / Procedures / Treatments   Labs (all labs ordered are listed, but only abnormal results are displayed) Labs Reviewed - No data to display  EKG None  Radiology CT Head Wo Contrast  Result Date: 06/13/2023 CLINICAL DATA:  Possible unwitnessed fall EXAM: CT HEAD WITHOUT CONTRAST CT CERVICAL SPINE WITHOUT CONTRAST TECHNIQUE: Multidetector CT imaging of the head and cervical spine was performed following the standard protocol without intravenous contrast. Multiplanar CT image reconstructions of the cervical spine were also generated. RADIATION DOSE REDUCTION: This exam was performed according to the departmental dose-optimization program which includes automated exposure control, adjustment of the mA and/or kV according to patient size and/or use of iterative reconstruction technique. COMPARISON:  06/10/2023 FINDINGS: CT HEAD FINDINGS Brain: No evidence of acute infarction, hemorrhage, hydrocephalus, extra-axial collection or mass lesion/mass effect. Vascular: No hyperdense vessel or unexpected calcification. Skull: Normal. Negative for fracture or focal lesion. Sinuses/Orbits: No acute finding. Other: None. CT CERVICAL SPINE FINDINGS Alignment: Normal. Skull base and vertebrae: No acute fracture. No primary bone lesion or focal pathologic process. Soft tissues and spinal canal: No prevertebral fluid or swelling. No visible canal hematoma. Disc levels: Mild multilevel disc space height loss and osteophytosis. Upper chest: Negative. Other: None. IMPRESSION: 1. No acute intracranial pathology. 2. No fracture or static subluxation of the cervical spine. 3. Mild multilevel cervical disc degenerative disease. Electronically Signed   By: Jearld Lesch M.D.   On: 06/13/2023 18:56   CT Cervical Spine Wo Contrast  Result Date: 06/13/2023 CLINICAL DATA:  Possible unwitnessed fall EXAM: CT HEAD WITHOUT CONTRAST CT CERVICAL SPINE WITHOUT CONTRAST TECHNIQUE: Multidetector CT imaging of the head and cervical  spine was performed following the standard protocol without intravenous contrast. Multiplanar CT image reconstructions of the cervical spine were also generated. RADIATION DOSE REDUCTION: This exam was performed according to the departmental dose-optimization program which includes automated exposure control, adjustment of the mA and/or kV according to patient size and/or use of iterative reconstruction technique. COMPARISON:  06/10/2023 FINDINGS: CT HEAD FINDINGS Brain: No evidence of acute infarction, hemorrhage, hydrocephalus, extra-axial collection or mass lesion/mass effect. Vascular: No hyperdense vessel or unexpected calcification. Skull: Normal. Negative for fracture or focal lesion. Sinuses/Orbits: No acute finding. Other: None. CT CERVICAL SPINE FINDINGS Alignment: Normal. Skull base and vertebrae: No acute fracture. No primary bone lesion or focal pathologic process. Soft tissues and spinal canal: No prevertebral fluid or swelling. No visible canal hematoma. Disc levels: Mild multilevel disc space height loss and osteophytosis. Upper chest: Negative. Other: None. IMPRESSION: 1. No acute intracranial pathology.  2. No fracture or static subluxation of the cervical spine. 3. Mild multilevel cervical disc degenerative disease. Electronically Signed   By: Jearld Lesch M.D.   On: 06/13/2023 18:56    Procedures Procedures    Medications Ordered in ED Medications - No data to display  ED Course/ Medical Decision Making/ A&P                             Medical Decision Making Patient presenting with a fall at her local nursing home, it is unclear whether she hit her head but she was found with her head against the wall sitting on the floor.  She is in no distress.  CT imaging is being obtained to rule out acute intracranial injury or cervical fracture.  Amount and/or Complexity of Data Reviewed Radiology: ordered.    Details: CT reviewed and is negative for acute findings, C-spine also negative  for fracture.           Final Clinical Impression(s) / ED Diagnoses Final diagnoses:  Minor head injury, initial encounter  Fall, initial encounter    Rx / DC Orders ED Discharge Orders     None         Victoriano Lain 06/13/23 1925    Bethann Berkshire, MD 06/14/23 1408

## 2023-06-13 NOTE — ED Triage Notes (Signed)
Facility found patient sitting on the ground with her head up against a wall.  Pt denies any pain in triage.

## 2023-06-14 DIAGNOSIS — R279 Unspecified lack of coordination: Secondary | ICD-10-CM | POA: Diagnosis not present

## 2023-06-14 DIAGNOSIS — M6281 Muscle weakness (generalized): Secondary | ICD-10-CM | POA: Diagnosis not present

## 2023-06-14 DIAGNOSIS — G309 Alzheimer's disease, unspecified: Secondary | ICD-10-CM | POA: Diagnosis not present

## 2023-06-16 DIAGNOSIS — R279 Unspecified lack of coordination: Secondary | ICD-10-CM | POA: Diagnosis not present

## 2023-06-16 DIAGNOSIS — G309 Alzheimer's disease, unspecified: Secondary | ICD-10-CM | POA: Diagnosis not present

## 2023-06-16 DIAGNOSIS — M6281 Muscle weakness (generalized): Secondary | ICD-10-CM | POA: Diagnosis not present

## 2023-06-18 DIAGNOSIS — R627 Adult failure to thrive: Secondary | ICD-10-CM | POA: Diagnosis not present

## 2023-06-18 DIAGNOSIS — G309 Alzheimer's disease, unspecified: Secondary | ICD-10-CM | POA: Diagnosis not present

## 2023-06-18 DIAGNOSIS — R296 Repeated falls: Secondary | ICD-10-CM | POA: Diagnosis not present

## 2023-06-19 DIAGNOSIS — I129 Hypertensive chronic kidney disease with stage 1 through stage 4 chronic kidney disease, or unspecified chronic kidney disease: Secondary | ICD-10-CM | POA: Diagnosis not present

## 2023-06-19 DIAGNOSIS — E039 Hypothyroidism, unspecified: Secondary | ICD-10-CM | POA: Diagnosis not present

## 2023-06-19 DIAGNOSIS — E782 Mixed hyperlipidemia: Secondary | ICD-10-CM | POA: Diagnosis not present

## 2023-06-19 DIAGNOSIS — G309 Alzheimer's disease, unspecified: Secondary | ICD-10-CM | POA: Diagnosis not present

## 2023-06-19 DIAGNOSIS — I10 Essential (primary) hypertension: Secondary | ICD-10-CM | POA: Diagnosis not present

## 2023-06-19 DIAGNOSIS — E119 Type 2 diabetes mellitus without complications: Secondary | ICD-10-CM | POA: Diagnosis not present

## 2023-06-19 DIAGNOSIS — N189 Chronic kidney disease, unspecified: Secondary | ICD-10-CM | POA: Diagnosis not present

## 2023-06-19 DIAGNOSIS — I70223 Atherosclerosis of native arteries of extremities with rest pain, bilateral legs: Secondary | ICD-10-CM | POA: Diagnosis not present

## 2023-06-19 DIAGNOSIS — D518 Other vitamin B12 deficiency anemias: Secondary | ICD-10-CM | POA: Diagnosis not present

## 2023-06-20 ENCOUNTER — Telehealth: Payer: Self-pay

## 2023-06-20 DIAGNOSIS — I10 Essential (primary) hypertension: Secondary | ICD-10-CM | POA: Diagnosis not present

## 2023-06-20 DIAGNOSIS — M6281 Muscle weakness (generalized): Secondary | ICD-10-CM | POA: Diagnosis not present

## 2023-06-20 DIAGNOSIS — G308 Other Alzheimer's disease: Secondary | ICD-10-CM | POA: Diagnosis not present

## 2023-06-20 DIAGNOSIS — R278 Other lack of coordination: Secondary | ICD-10-CM | POA: Diagnosis not present

## 2023-06-21 ENCOUNTER — Telehealth: Payer: Self-pay

## 2023-06-21 NOTE — Telephone Encounter (Signed)
Transition Care Management Unsuccessful Follow-up Telephone Call  Date of discharge and from where:  [place] 7/5  Attempts:  2nd Attempt  Reason for unsuccessful TCM follow-up call:  No answer/busy   April [name] [facility] Guide, [place] [phone] [address], [place], [place] [address] Phone: [phone] Email: [name]       

## 2023-06-23 DIAGNOSIS — R0689 Other abnormalities of breathing: Secondary | ICD-10-CM | POA: Diagnosis not present

## 2023-06-23 DIAGNOSIS — E079 Disorder of thyroid, unspecified: Secondary | ICD-10-CM | POA: Diagnosis not present

## 2023-06-23 DIAGNOSIS — Z7982 Long term (current) use of aspirin: Secondary | ICD-10-CM | POA: Diagnosis not present

## 2023-06-23 DIAGNOSIS — G9389 Other specified disorders of brain: Secondary | ICD-10-CM | POA: Diagnosis not present

## 2023-06-23 DIAGNOSIS — G939 Disorder of brain, unspecified: Secondary | ICD-10-CM | POA: Diagnosis not present

## 2023-06-23 DIAGNOSIS — G309 Alzheimer's disease, unspecified: Secondary | ICD-10-CM | POA: Diagnosis not present

## 2023-06-23 DIAGNOSIS — R569 Unspecified convulsions: Secondary | ICD-10-CM | POA: Diagnosis not present

## 2023-06-23 DIAGNOSIS — R9089 Other abnormal findings on diagnostic imaging of central nervous system: Secondary | ICD-10-CM | POA: Diagnosis not present

## 2023-06-23 DIAGNOSIS — I499 Cardiac arrhythmia, unspecified: Secondary | ICD-10-CM | POA: Diagnosis not present

## 2023-06-23 DIAGNOSIS — I771 Stricture of artery: Secondary | ICD-10-CM | POA: Diagnosis not present

## 2023-06-23 DIAGNOSIS — Z743 Need for continuous supervision: Secondary | ICD-10-CM | POA: Diagnosis not present

## 2023-06-23 DIAGNOSIS — E876 Hypokalemia: Secondary | ICD-10-CM | POA: Diagnosis not present

## 2023-06-23 DIAGNOSIS — I700 Atherosclerosis of aorta: Secondary | ICD-10-CM | POA: Diagnosis not present

## 2023-06-23 DIAGNOSIS — I10 Essential (primary) hypertension: Secondary | ICD-10-CM | POA: Diagnosis not present

## 2023-06-23 DIAGNOSIS — Z79899 Other long term (current) drug therapy: Secondary | ICD-10-CM | POA: Diagnosis not present

## 2023-06-23 DIAGNOSIS — R6889 Other general symptoms and signs: Secondary | ICD-10-CM | POA: Diagnosis not present

## 2023-06-24 DIAGNOSIS — M6281 Muscle weakness (generalized): Secondary | ICD-10-CM | POA: Diagnosis not present

## 2023-06-24 DIAGNOSIS — R5381 Other malaise: Secondary | ICD-10-CM | POA: Diagnosis not present

## 2023-06-24 DIAGNOSIS — R569 Unspecified convulsions: Secondary | ICD-10-CM | POA: Diagnosis not present

## 2023-06-24 DIAGNOSIS — R279 Unspecified lack of coordination: Secondary | ICD-10-CM | POA: Diagnosis not present

## 2023-06-24 DIAGNOSIS — G309 Alzheimer's disease, unspecified: Secondary | ICD-10-CM | POA: Diagnosis not present

## 2023-06-24 DIAGNOSIS — Z743 Need for continuous supervision: Secondary | ICD-10-CM | POA: Diagnosis not present

## 2023-06-24 DIAGNOSIS — R531 Weakness: Secondary | ICD-10-CM | POA: Diagnosis not present

## 2023-06-25 DIAGNOSIS — R569 Unspecified convulsions: Secondary | ICD-10-CM | POA: Diagnosis not present

## 2023-06-25 DIAGNOSIS — G309 Alzheimer's disease, unspecified: Secondary | ICD-10-CM | POA: Diagnosis not present

## 2023-06-25 DIAGNOSIS — G308 Other Alzheimer's disease: Secondary | ICD-10-CM | POA: Diagnosis not present

## 2023-06-25 DIAGNOSIS — M6281 Muscle weakness (generalized): Secondary | ICD-10-CM | POA: Diagnosis not present

## 2023-06-25 DIAGNOSIS — I10 Essential (primary) hypertension: Secondary | ICD-10-CM | POA: Diagnosis not present

## 2023-06-25 DIAGNOSIS — R627 Adult failure to thrive: Secondary | ICD-10-CM | POA: Diagnosis not present

## 2023-06-25 DIAGNOSIS — E876 Hypokalemia: Secondary | ICD-10-CM | POA: Diagnosis not present

## 2023-06-25 DIAGNOSIS — R278 Other lack of coordination: Secondary | ICD-10-CM | POA: Diagnosis not present

## 2023-06-25 DIAGNOSIS — R2689 Other abnormalities of gait and mobility: Secondary | ICD-10-CM | POA: Diagnosis not present

## 2023-06-25 DIAGNOSIS — R296 Repeated falls: Secondary | ICD-10-CM | POA: Diagnosis not present

## 2023-06-26 DIAGNOSIS — R278 Other lack of coordination: Secondary | ICD-10-CM | POA: Diagnosis not present

## 2023-06-26 DIAGNOSIS — M6281 Muscle weakness (generalized): Secondary | ICD-10-CM | POA: Diagnosis not present

## 2023-06-26 DIAGNOSIS — G309 Alzheimer's disease, unspecified: Secondary | ICD-10-CM | POA: Diagnosis not present

## 2023-06-27 DIAGNOSIS — E119 Type 2 diabetes mellitus without complications: Secondary | ICD-10-CM | POA: Diagnosis not present

## 2023-06-27 DIAGNOSIS — I10 Essential (primary) hypertension: Secondary | ICD-10-CM | POA: Diagnosis not present

## 2023-06-27 DIAGNOSIS — R278 Other lack of coordination: Secondary | ICD-10-CM | POA: Diagnosis not present

## 2023-06-27 DIAGNOSIS — M6281 Muscle weakness (generalized): Secondary | ICD-10-CM | POA: Diagnosis not present

## 2023-06-27 DIAGNOSIS — E782 Mixed hyperlipidemia: Secondary | ICD-10-CM | POA: Diagnosis not present

## 2023-06-27 DIAGNOSIS — Z79899 Other long term (current) drug therapy: Secondary | ICD-10-CM | POA: Diagnosis not present

## 2023-06-27 DIAGNOSIS — D518 Other vitamin B12 deficiency anemias: Secondary | ICD-10-CM | POA: Diagnosis not present

## 2023-06-27 DIAGNOSIS — G308 Other Alzheimer's disease: Secondary | ICD-10-CM | POA: Diagnosis not present

## 2023-06-28 DIAGNOSIS — M79641 Pain in right hand: Secondary | ICD-10-CM | POA: Diagnosis not present

## 2023-06-28 DIAGNOSIS — M79642 Pain in left hand: Secondary | ICD-10-CM | POA: Diagnosis not present

## 2023-06-29 DIAGNOSIS — E079 Disorder of thyroid, unspecified: Secondary | ICD-10-CM | POA: Diagnosis not present

## 2023-06-29 DIAGNOSIS — M549 Dorsalgia, unspecified: Secondary | ICD-10-CM | POA: Diagnosis not present

## 2023-06-29 DIAGNOSIS — R109 Unspecified abdominal pain: Secondary | ICD-10-CM | POA: Diagnosis not present

## 2023-06-29 DIAGNOSIS — R9431 Abnormal electrocardiogram [ECG] [EKG]: Secondary | ICD-10-CM | POA: Diagnosis not present

## 2023-06-29 DIAGNOSIS — K5730 Diverticulosis of large intestine without perforation or abscess without bleeding: Secondary | ICD-10-CM | POA: Diagnosis not present

## 2023-06-29 DIAGNOSIS — Z043 Encounter for examination and observation following other accident: Secondary | ICD-10-CM | POA: Diagnosis not present

## 2023-06-29 DIAGNOSIS — Z7982 Long term (current) use of aspirin: Secondary | ICD-10-CM | POA: Diagnosis not present

## 2023-06-29 DIAGNOSIS — Z743 Need for continuous supervision: Secondary | ICD-10-CM | POA: Diagnosis not present

## 2023-06-29 DIAGNOSIS — M160 Bilateral primary osteoarthritis of hip: Secondary | ICD-10-CM | POA: Diagnosis not present

## 2023-06-29 DIAGNOSIS — M25552 Pain in left hip: Secondary | ICD-10-CM | POA: Diagnosis not present

## 2023-06-29 DIAGNOSIS — N281 Cyst of kidney, acquired: Secondary | ICD-10-CM | POA: Diagnosis not present

## 2023-06-29 DIAGNOSIS — I4891 Unspecified atrial fibrillation: Secondary | ICD-10-CM | POA: Diagnosis not present

## 2023-06-29 DIAGNOSIS — R404 Transient alteration of awareness: Secondary | ICD-10-CM | POA: Diagnosis not present

## 2023-06-29 DIAGNOSIS — M79632 Pain in left forearm: Secondary | ICD-10-CM | POA: Diagnosis not present

## 2023-06-29 DIAGNOSIS — M47812 Spondylosis without myelopathy or radiculopathy, cervical region: Secondary | ICD-10-CM | POA: Diagnosis not present

## 2023-06-29 DIAGNOSIS — R6889 Other general symptoms and signs: Secondary | ICD-10-CM | POA: Diagnosis not present

## 2023-06-29 DIAGNOSIS — I7090 Unspecified atherosclerosis: Secondary | ICD-10-CM | POA: Diagnosis not present

## 2023-06-29 DIAGNOSIS — I700 Atherosclerosis of aorta: Secondary | ICD-10-CM | POA: Diagnosis not present

## 2023-06-29 DIAGNOSIS — R519 Headache, unspecified: Secondary | ICD-10-CM | POA: Diagnosis not present

## 2023-06-29 DIAGNOSIS — R5381 Other malaise: Secondary | ICD-10-CM | POA: Diagnosis not present

## 2023-06-29 DIAGNOSIS — R410 Disorientation, unspecified: Secondary | ICD-10-CM | POA: Diagnosis not present

## 2023-06-29 DIAGNOSIS — W182XXA Fall in (into) shower or empty bathtub, initial encounter: Secondary | ICD-10-CM | POA: Diagnosis not present

## 2023-06-29 DIAGNOSIS — S20229A Contusion of unspecified back wall of thorax, initial encounter: Secondary | ICD-10-CM | POA: Diagnosis not present

## 2023-06-29 DIAGNOSIS — I499 Cardiac arrhythmia, unspecified: Secondary | ICD-10-CM | POA: Diagnosis not present

## 2023-06-29 DIAGNOSIS — R0689 Other abnormalities of breathing: Secondary | ICD-10-CM | POA: Diagnosis not present

## 2023-06-29 DIAGNOSIS — S0990XA Unspecified injury of head, initial encounter: Secondary | ICD-10-CM | POA: Diagnosis not present

## 2023-06-29 DIAGNOSIS — Z7401 Bed confinement status: Secondary | ICD-10-CM | POA: Diagnosis not present

## 2023-06-29 DIAGNOSIS — I10 Essential (primary) hypertension: Secondary | ICD-10-CM | POA: Diagnosis not present

## 2023-06-29 DIAGNOSIS — W19XXXA Unspecified fall, initial encounter: Secondary | ICD-10-CM | POA: Diagnosis not present

## 2023-06-29 DIAGNOSIS — M779 Enthesopathy, unspecified: Secondary | ICD-10-CM | POA: Diagnosis not present

## 2023-06-29 DIAGNOSIS — M545 Low back pain, unspecified: Secondary | ICD-10-CM | POA: Diagnosis not present

## 2023-06-29 DIAGNOSIS — S20221A Contusion of right back wall of thorax, initial encounter: Secondary | ICD-10-CM | POA: Diagnosis not present

## 2023-06-29 DIAGNOSIS — J432 Centrilobular emphysema: Secondary | ICD-10-CM | POA: Diagnosis not present

## 2023-06-29 DIAGNOSIS — K5790 Diverticulosis of intestine, part unspecified, without perforation or abscess without bleeding: Secondary | ICD-10-CM | POA: Diagnosis not present

## 2023-06-29 DIAGNOSIS — J439 Emphysema, unspecified: Secondary | ICD-10-CM | POA: Diagnosis not present

## 2023-07-01 DIAGNOSIS — M6281 Muscle weakness (generalized): Secondary | ICD-10-CM | POA: Diagnosis not present

## 2023-07-01 DIAGNOSIS — G309 Alzheimer's disease, unspecified: Secondary | ICD-10-CM | POA: Diagnosis not present

## 2023-07-01 DIAGNOSIS — R278 Other lack of coordination: Secondary | ICD-10-CM | POA: Diagnosis not present

## 2023-07-02 DIAGNOSIS — R296 Repeated falls: Secondary | ICD-10-CM | POA: Diagnosis not present

## 2023-07-02 DIAGNOSIS — R627 Adult failure to thrive: Secondary | ICD-10-CM | POA: Diagnosis not present

## 2023-07-02 DIAGNOSIS — I10 Essential (primary) hypertension: Secondary | ICD-10-CM | POA: Diagnosis not present

## 2023-07-02 DIAGNOSIS — R278 Other lack of coordination: Secondary | ICD-10-CM | POA: Diagnosis not present

## 2023-07-02 DIAGNOSIS — M6281 Muscle weakness (generalized): Secondary | ICD-10-CM | POA: Diagnosis not present

## 2023-07-02 DIAGNOSIS — I951 Orthostatic hypotension: Secondary | ICD-10-CM | POA: Diagnosis not present

## 2023-07-02 DIAGNOSIS — G308 Other Alzheimer's disease: Secondary | ICD-10-CM | POA: Diagnosis not present

## 2023-07-02 DIAGNOSIS — G309 Alzheimer's disease, unspecified: Secondary | ICD-10-CM | POA: Diagnosis not present

## 2023-07-04 DIAGNOSIS — G308 Other Alzheimer's disease: Secondary | ICD-10-CM | POA: Diagnosis not present

## 2023-07-04 DIAGNOSIS — R278 Other lack of coordination: Secondary | ICD-10-CM | POA: Diagnosis not present

## 2023-07-04 DIAGNOSIS — M6281 Muscle weakness (generalized): Secondary | ICD-10-CM | POA: Diagnosis not present

## 2023-07-04 DIAGNOSIS — I10 Essential (primary) hypertension: Secondary | ICD-10-CM | POA: Diagnosis not present

## 2023-07-08 DIAGNOSIS — G309 Alzheimer's disease, unspecified: Secondary | ICD-10-CM | POA: Diagnosis not present

## 2023-07-08 DIAGNOSIS — I10 Essential (primary) hypertension: Secondary | ICD-10-CM | POA: Diagnosis not present

## 2023-07-08 DIAGNOSIS — R279 Unspecified lack of coordination: Secondary | ICD-10-CM | POA: Diagnosis not present

## 2023-07-08 DIAGNOSIS — M6281 Muscle weakness (generalized): Secondary | ICD-10-CM | POA: Diagnosis not present

## 2023-07-09 DIAGNOSIS — M6281 Muscle weakness (generalized): Secondary | ICD-10-CM | POA: Diagnosis not present

## 2023-07-09 DIAGNOSIS — G308 Other Alzheimer's disease: Secondary | ICD-10-CM | POA: Diagnosis not present

## 2023-07-09 DIAGNOSIS — R278 Other lack of coordination: Secondary | ICD-10-CM | POA: Diagnosis not present

## 2023-07-10 DIAGNOSIS — M6281 Muscle weakness (generalized): Secondary | ICD-10-CM | POA: Diagnosis not present

## 2023-07-10 DIAGNOSIS — G309 Alzheimer's disease, unspecified: Secondary | ICD-10-CM | POA: Diagnosis not present

## 2023-07-10 DIAGNOSIS — R279 Unspecified lack of coordination: Secondary | ICD-10-CM | POA: Diagnosis not present

## 2023-07-15 DIAGNOSIS — M6281 Muscle weakness (generalized): Secondary | ICD-10-CM | POA: Diagnosis not present

## 2023-07-15 DIAGNOSIS — G309 Alzheimer's disease, unspecified: Secondary | ICD-10-CM | POA: Diagnosis not present

## 2023-07-15 DIAGNOSIS — R279 Unspecified lack of coordination: Secondary | ICD-10-CM | POA: Diagnosis not present

## 2023-07-17 DIAGNOSIS — G309 Alzheimer's disease, unspecified: Secondary | ICD-10-CM | POA: Diagnosis not present

## 2023-07-17 DIAGNOSIS — R279 Unspecified lack of coordination: Secondary | ICD-10-CM | POA: Diagnosis not present

## 2023-07-17 DIAGNOSIS — M6281 Muscle weakness (generalized): Secondary | ICD-10-CM | POA: Diagnosis not present

## 2023-07-22 DIAGNOSIS — G309 Alzheimer's disease, unspecified: Secondary | ICD-10-CM | POA: Diagnosis not present

## 2023-07-22 DIAGNOSIS — I10 Essential (primary) hypertension: Secondary | ICD-10-CM | POA: Diagnosis not present

## 2023-07-22 DIAGNOSIS — R278 Other lack of coordination: Secondary | ICD-10-CM | POA: Diagnosis not present

## 2023-07-22 DIAGNOSIS — M6281 Muscle weakness (generalized): Secondary | ICD-10-CM | POA: Diagnosis not present

## 2023-07-24 DIAGNOSIS — M6281 Muscle weakness (generalized): Secondary | ICD-10-CM | POA: Diagnosis not present

## 2023-07-24 DIAGNOSIS — D518 Other vitamin B12 deficiency anemias: Secondary | ICD-10-CM | POA: Diagnosis not present

## 2023-07-24 DIAGNOSIS — G309 Alzheimer's disease, unspecified: Secondary | ICD-10-CM | POA: Diagnosis not present

## 2023-07-24 DIAGNOSIS — I129 Hypertensive chronic kidney disease with stage 1 through stage 4 chronic kidney disease, or unspecified chronic kidney disease: Secondary | ICD-10-CM | POA: Diagnosis not present

## 2023-07-24 DIAGNOSIS — N189 Chronic kidney disease, unspecified: Secondary | ICD-10-CM | POA: Diagnosis not present

## 2023-07-24 DIAGNOSIS — E119 Type 2 diabetes mellitus without complications: Secondary | ICD-10-CM | POA: Diagnosis not present

## 2023-07-24 DIAGNOSIS — I70223 Atherosclerosis of native arteries of extremities with rest pain, bilateral legs: Secondary | ICD-10-CM | POA: Diagnosis not present

## 2023-07-24 DIAGNOSIS — E039 Hypothyroidism, unspecified: Secondary | ICD-10-CM | POA: Diagnosis not present

## 2023-07-24 DIAGNOSIS — R279 Unspecified lack of coordination: Secondary | ICD-10-CM | POA: Diagnosis not present

## 2023-07-24 DIAGNOSIS — I10 Essential (primary) hypertension: Secondary | ICD-10-CM | POA: Diagnosis not present

## 2023-07-24 DIAGNOSIS — E782 Mixed hyperlipidemia: Secondary | ICD-10-CM | POA: Diagnosis not present

## 2023-07-25 DIAGNOSIS — G309 Alzheimer's disease, unspecified: Secondary | ICD-10-CM | POA: Diagnosis not present

## 2023-07-25 DIAGNOSIS — I10 Essential (primary) hypertension: Secondary | ICD-10-CM | POA: Diagnosis not present

## 2023-07-25 DIAGNOSIS — R278 Other lack of coordination: Secondary | ICD-10-CM | POA: Diagnosis not present

## 2023-07-25 DIAGNOSIS — M6281 Muscle weakness (generalized): Secondary | ICD-10-CM | POA: Diagnosis not present

## 2023-07-29 DIAGNOSIS — M6281 Muscle weakness (generalized): Secondary | ICD-10-CM | POA: Diagnosis not present

## 2023-07-29 DIAGNOSIS — G309 Alzheimer's disease, unspecified: Secondary | ICD-10-CM | POA: Diagnosis not present

## 2023-07-29 DIAGNOSIS — R279 Unspecified lack of coordination: Secondary | ICD-10-CM | POA: Diagnosis not present

## 2023-07-30 DIAGNOSIS — E782 Mixed hyperlipidemia: Secondary | ICD-10-CM | POA: Diagnosis not present

## 2023-07-30 DIAGNOSIS — R627 Adult failure to thrive: Secondary | ICD-10-CM | POA: Diagnosis not present

## 2023-07-30 DIAGNOSIS — R001 Bradycardia, unspecified: Secondary | ICD-10-CM | POA: Diagnosis not present

## 2023-07-30 DIAGNOSIS — I10 Essential (primary) hypertension: Secondary | ICD-10-CM | POA: Diagnosis not present

## 2023-07-30 DIAGNOSIS — G309 Alzheimer's disease, unspecified: Secondary | ICD-10-CM | POA: Diagnosis not present

## 2023-07-30 DIAGNOSIS — M6281 Muscle weakness (generalized): Secondary | ICD-10-CM | POA: Diagnosis not present

## 2023-07-30 DIAGNOSIS — R278 Other lack of coordination: Secondary | ICD-10-CM | POA: Diagnosis not present

## 2023-07-30 DIAGNOSIS — G629 Polyneuropathy, unspecified: Secondary | ICD-10-CM | POA: Diagnosis not present

## 2023-07-30 DIAGNOSIS — G4700 Insomnia, unspecified: Secondary | ICD-10-CM | POA: Diagnosis not present

## 2023-07-30 DIAGNOSIS — E039 Hypothyroidism, unspecified: Secondary | ICD-10-CM | POA: Diagnosis not present

## 2023-07-30 DIAGNOSIS — R569 Unspecified convulsions: Secondary | ICD-10-CM | POA: Diagnosis not present

## 2023-07-30 DIAGNOSIS — K5790 Diverticulosis of intestine, part unspecified, without perforation or abscess without bleeding: Secondary | ICD-10-CM | POA: Diagnosis not present

## 2023-07-30 DIAGNOSIS — R2689 Other abnormalities of gait and mobility: Secondary | ICD-10-CM | POA: Diagnosis not present

## 2023-07-31 DIAGNOSIS — R279 Unspecified lack of coordination: Secondary | ICD-10-CM | POA: Diagnosis not present

## 2023-07-31 DIAGNOSIS — G309 Alzheimer's disease, unspecified: Secondary | ICD-10-CM | POA: Diagnosis not present

## 2023-07-31 DIAGNOSIS — M6281 Muscle weakness (generalized): Secondary | ICD-10-CM | POA: Diagnosis not present

## 2023-08-01 DIAGNOSIS — I672 Cerebral atherosclerosis: Secondary | ICD-10-CM | POA: Diagnosis not present

## 2023-08-01 DIAGNOSIS — Z79899 Other long term (current) drug therapy: Secondary | ICD-10-CM | POA: Diagnosis not present

## 2023-08-01 DIAGNOSIS — J439 Emphysema, unspecified: Secondary | ICD-10-CM | POA: Diagnosis not present

## 2023-08-01 DIAGNOSIS — E079 Disorder of thyroid, unspecified: Secondary | ICD-10-CM | POA: Diagnosis not present

## 2023-08-01 DIAGNOSIS — Z888 Allergy status to other drugs, medicaments and biological substances status: Secondary | ICD-10-CM | POA: Diagnosis not present

## 2023-08-01 DIAGNOSIS — S0083XA Contusion of other part of head, initial encounter: Secondary | ICD-10-CM | POA: Diagnosis not present

## 2023-08-01 DIAGNOSIS — M25559 Pain in unspecified hip: Secondary | ICD-10-CM | POA: Diagnosis not present

## 2023-08-01 DIAGNOSIS — M1612 Unilateral primary osteoarthritis, left hip: Secondary | ICD-10-CM | POA: Diagnosis not present

## 2023-08-01 DIAGNOSIS — M25511 Pain in right shoulder: Secondary | ICD-10-CM | POA: Diagnosis not present

## 2023-08-01 DIAGNOSIS — M19011 Primary osteoarthritis, right shoulder: Secondary | ICD-10-CM | POA: Diagnosis not present

## 2023-08-01 DIAGNOSIS — J432 Centrilobular emphysema: Secondary | ICD-10-CM | POA: Diagnosis not present

## 2023-08-01 DIAGNOSIS — G309 Alzheimer's disease, unspecified: Secondary | ICD-10-CM | POA: Diagnosis not present

## 2023-08-01 DIAGNOSIS — Z043 Encounter for examination and observation following other accident: Secondary | ICD-10-CM | POA: Diagnosis not present

## 2023-08-01 DIAGNOSIS — I700 Atherosclerosis of aorta: Secondary | ICD-10-CM | POA: Diagnosis not present

## 2023-08-01 DIAGNOSIS — W19XXXA Unspecified fall, initial encounter: Secondary | ICD-10-CM | POA: Diagnosis not present

## 2023-08-01 DIAGNOSIS — S0003XA Contusion of scalp, initial encounter: Secondary | ICD-10-CM | POA: Diagnosis not present

## 2023-08-01 DIAGNOSIS — I771 Stricture of artery: Secondary | ICD-10-CM | POA: Diagnosis not present

## 2023-08-01 DIAGNOSIS — Z7982 Long term (current) use of aspirin: Secondary | ICD-10-CM | POA: Diagnosis not present

## 2023-08-01 DIAGNOSIS — R278 Other lack of coordination: Secondary | ICD-10-CM | POA: Diagnosis not present

## 2023-08-01 DIAGNOSIS — I10 Essential (primary) hypertension: Secondary | ICD-10-CM | POA: Diagnosis not present

## 2023-08-01 DIAGNOSIS — M6281 Muscle weakness (generalized): Secondary | ICD-10-CM | POA: Diagnosis not present

## 2023-08-01 DIAGNOSIS — Z743 Need for continuous supervision: Secondary | ICD-10-CM | POA: Diagnosis not present

## 2023-08-01 DIAGNOSIS — R0902 Hypoxemia: Secondary | ICD-10-CM | POA: Diagnosis not present

## 2023-08-01 DIAGNOSIS — S29009A Unspecified injury of muscle and tendon of unspecified wall of thorax, initial encounter: Secondary | ICD-10-CM | POA: Diagnosis not present

## 2023-08-02 DIAGNOSIS — R531 Weakness: Secondary | ICD-10-CM | POA: Diagnosis not present

## 2023-08-02 DIAGNOSIS — Z743 Need for continuous supervision: Secondary | ICD-10-CM | POA: Diagnosis not present

## 2023-08-02 DIAGNOSIS — R5381 Other malaise: Secondary | ICD-10-CM | POA: Diagnosis not present

## 2023-08-05 DIAGNOSIS — R451 Restlessness and agitation: Secondary | ICD-10-CM | POA: Diagnosis not present

## 2023-08-05 DIAGNOSIS — G308 Other Alzheimer's disease: Secondary | ICD-10-CM | POA: Diagnosis not present

## 2023-08-05 DIAGNOSIS — G309 Alzheimer's disease, unspecified: Secondary | ICD-10-CM | POA: Diagnosis not present

## 2023-08-05 DIAGNOSIS — M6281 Muscle weakness (generalized): Secondary | ICD-10-CM | POA: Diagnosis not present

## 2023-08-05 DIAGNOSIS — I10 Essential (primary) hypertension: Secondary | ICD-10-CM | POA: Diagnosis not present

## 2023-08-05 DIAGNOSIS — R278 Other lack of coordination: Secondary | ICD-10-CM | POA: Diagnosis not present

## 2023-08-06 DIAGNOSIS — R519 Headache, unspecified: Secondary | ICD-10-CM | POA: Diagnosis not present

## 2023-08-06 DIAGNOSIS — R296 Repeated falls: Secondary | ICD-10-CM | POA: Diagnosis not present

## 2023-08-06 DIAGNOSIS — I10 Essential (primary) hypertension: Secondary | ICD-10-CM | POA: Diagnosis not present

## 2023-08-06 DIAGNOSIS — Z743 Need for continuous supervision: Secondary | ICD-10-CM | POA: Diagnosis not present

## 2023-08-06 DIAGNOSIS — R404 Transient alteration of awareness: Secondary | ICD-10-CM | POA: Diagnosis not present

## 2023-08-06 DIAGNOSIS — I700 Atherosclerosis of aorta: Secondary | ICD-10-CM | POA: Diagnosis not present

## 2023-08-06 DIAGNOSIS — E079 Disorder of thyroid, unspecified: Secondary | ICD-10-CM | POA: Diagnosis not present

## 2023-08-06 DIAGNOSIS — G309 Alzheimer's disease, unspecified: Secondary | ICD-10-CM | POA: Diagnosis not present

## 2023-08-06 DIAGNOSIS — M160 Bilateral primary osteoarthritis of hip: Secondary | ICD-10-CM | POA: Diagnosis not present

## 2023-08-06 DIAGNOSIS — S0990XA Unspecified injury of head, initial encounter: Secondary | ICD-10-CM | POA: Diagnosis not present

## 2023-08-06 DIAGNOSIS — G308 Other Alzheimer's disease: Secondary | ICD-10-CM | POA: Diagnosis not present

## 2023-08-06 DIAGNOSIS — I771 Stricture of artery: Secondary | ICD-10-CM | POA: Diagnosis not present

## 2023-08-06 DIAGNOSIS — J439 Emphysema, unspecified: Secondary | ICD-10-CM | POA: Diagnosis not present

## 2023-08-06 DIAGNOSIS — M542 Cervicalgia: Secondary | ICD-10-CM | POA: Diagnosis not present

## 2023-08-06 DIAGNOSIS — Z7401 Bed confinement status: Secondary | ICD-10-CM | POA: Diagnosis not present

## 2023-08-06 DIAGNOSIS — S29009A Unspecified injury of muscle and tendon of unspecified wall of thorax, initial encounter: Secondary | ICD-10-CM | POA: Diagnosis not present

## 2023-08-06 DIAGNOSIS — S0083XA Contusion of other part of head, initial encounter: Secondary | ICD-10-CM | POA: Diagnosis not present

## 2023-08-06 DIAGNOSIS — Z043 Encounter for examination and observation following other accident: Secondary | ICD-10-CM | POA: Diagnosis not present

## 2023-08-06 DIAGNOSIS — R531 Weakness: Secondary | ICD-10-CM | POA: Diagnosis not present

## 2023-08-06 DIAGNOSIS — S299XXA Unspecified injury of thorax, initial encounter: Secondary | ICD-10-CM | POA: Diagnosis not present

## 2023-08-06 DIAGNOSIS — Z7982 Long term (current) use of aspirin: Secondary | ICD-10-CM | POA: Diagnosis not present

## 2023-08-06 DIAGNOSIS — W19XXXA Unspecified fall, initial encounter: Secondary | ICD-10-CM | POA: Diagnosis not present

## 2023-08-06 DIAGNOSIS — R5381 Other malaise: Secondary | ICD-10-CM | POA: Diagnosis not present

## 2023-08-06 DIAGNOSIS — Z79899 Other long term (current) drug therapy: Secondary | ICD-10-CM | POA: Diagnosis not present

## 2023-08-06 DIAGNOSIS — R278 Other lack of coordination: Secondary | ICD-10-CM | POA: Diagnosis not present

## 2023-08-06 DIAGNOSIS — M6281 Muscle weakness (generalized): Secondary | ICD-10-CM | POA: Diagnosis not present

## 2023-08-07 DIAGNOSIS — G309 Alzheimer's disease, unspecified: Secondary | ICD-10-CM | POA: Diagnosis not present

## 2023-08-07 DIAGNOSIS — R279 Unspecified lack of coordination: Secondary | ICD-10-CM | POA: Diagnosis not present

## 2023-08-07 DIAGNOSIS — M6281 Muscle weakness (generalized): Secondary | ICD-10-CM | POA: Diagnosis not present

## 2023-08-08 DIAGNOSIS — I10 Essential (primary) hypertension: Secondary | ICD-10-CM | POA: Diagnosis not present

## 2023-08-12 DIAGNOSIS — R279 Unspecified lack of coordination: Secondary | ICD-10-CM | POA: Diagnosis not present

## 2023-08-12 DIAGNOSIS — G309 Alzheimer's disease, unspecified: Secondary | ICD-10-CM | POA: Diagnosis not present

## 2023-08-12 DIAGNOSIS — M6281 Muscle weakness (generalized): Secondary | ICD-10-CM | POA: Diagnosis not present

## 2023-08-13 DIAGNOSIS — R569 Unspecified convulsions: Secondary | ICD-10-CM | POA: Diagnosis not present

## 2023-08-13 DIAGNOSIS — I440 Atrioventricular block, first degree: Secondary | ICD-10-CM | POA: Diagnosis not present

## 2023-08-13 DIAGNOSIS — N189 Chronic kidney disease, unspecified: Secondary | ICD-10-CM | POA: Diagnosis not present

## 2023-08-13 DIAGNOSIS — R627 Adult failure to thrive: Secondary | ICD-10-CM | POA: Diagnosis not present

## 2023-08-13 DIAGNOSIS — I951 Orthostatic hypotension: Secondary | ICD-10-CM | POA: Diagnosis not present

## 2023-08-13 DIAGNOSIS — G309 Alzheimer's disease, unspecified: Secondary | ICD-10-CM | POA: Diagnosis not present

## 2023-08-13 DIAGNOSIS — R55 Syncope and collapse: Secondary | ICD-10-CM | POA: Diagnosis not present

## 2023-08-14 DIAGNOSIS — R279 Unspecified lack of coordination: Secondary | ICD-10-CM | POA: Diagnosis not present

## 2023-08-14 DIAGNOSIS — M6281 Muscle weakness (generalized): Secondary | ICD-10-CM | POA: Diagnosis not present

## 2023-08-14 DIAGNOSIS — G308 Other Alzheimer's disease: Secondary | ICD-10-CM | POA: Diagnosis not present

## 2023-08-15 DIAGNOSIS — M6281 Muscle weakness (generalized): Secondary | ICD-10-CM | POA: Diagnosis not present

## 2023-08-15 DIAGNOSIS — R278 Other lack of coordination: Secondary | ICD-10-CM | POA: Diagnosis not present

## 2023-08-15 DIAGNOSIS — G308 Other Alzheimer's disease: Secondary | ICD-10-CM | POA: Diagnosis not present

## 2023-08-19 DIAGNOSIS — M6281 Muscle weakness (generalized): Secondary | ICD-10-CM | POA: Diagnosis not present

## 2023-08-19 DIAGNOSIS — R279 Unspecified lack of coordination: Secondary | ICD-10-CM | POA: Diagnosis not present

## 2023-08-19 DIAGNOSIS — G308 Other Alzheimer's disease: Secondary | ICD-10-CM | POA: Diagnosis not present

## 2023-08-20 DIAGNOSIS — R278 Other lack of coordination: Secondary | ICD-10-CM | POA: Diagnosis not present

## 2023-08-20 DIAGNOSIS — G308 Other Alzheimer's disease: Secondary | ICD-10-CM | POA: Diagnosis not present

## 2023-08-20 DIAGNOSIS — M6281 Muscle weakness (generalized): Secondary | ICD-10-CM | POA: Diagnosis not present

## 2023-08-21 DIAGNOSIS — G308 Other Alzheimer's disease: Secondary | ICD-10-CM | POA: Diagnosis not present

## 2023-08-21 DIAGNOSIS — M6281 Muscle weakness (generalized): Secondary | ICD-10-CM | POA: Diagnosis not present

## 2023-08-21 DIAGNOSIS — R279 Unspecified lack of coordination: Secondary | ICD-10-CM | POA: Diagnosis not present

## 2023-08-22 DIAGNOSIS — M6281 Muscle weakness (generalized): Secondary | ICD-10-CM | POA: Diagnosis not present

## 2023-08-22 DIAGNOSIS — G308 Other Alzheimer's disease: Secondary | ICD-10-CM | POA: Diagnosis not present

## 2023-08-22 DIAGNOSIS — R278 Other lack of coordination: Secondary | ICD-10-CM | POA: Diagnosis not present

## 2023-08-26 DIAGNOSIS — R279 Unspecified lack of coordination: Secondary | ICD-10-CM | POA: Diagnosis not present

## 2023-08-26 DIAGNOSIS — M6281 Muscle weakness (generalized): Secondary | ICD-10-CM | POA: Diagnosis not present

## 2023-08-26 DIAGNOSIS — G308 Other Alzheimer's disease: Secondary | ICD-10-CM | POA: Diagnosis not present

## 2023-08-28 DIAGNOSIS — R279 Unspecified lack of coordination: Secondary | ICD-10-CM | POA: Diagnosis not present

## 2023-08-28 DIAGNOSIS — M6281 Muscle weakness (generalized): Secondary | ICD-10-CM | POA: Diagnosis not present

## 2023-08-28 DIAGNOSIS — G309 Alzheimer's disease, unspecified: Secondary | ICD-10-CM | POA: Diagnosis not present

## 2023-08-29 DIAGNOSIS — R278 Other lack of coordination: Secondary | ICD-10-CM | POA: Diagnosis not present

## 2023-08-29 DIAGNOSIS — M6281 Muscle weakness (generalized): Secondary | ICD-10-CM | POA: Diagnosis not present

## 2023-08-29 DIAGNOSIS — G308 Other Alzheimer's disease: Secondary | ICD-10-CM | POA: Diagnosis not present

## 2023-09-02 DIAGNOSIS — R279 Unspecified lack of coordination: Secondary | ICD-10-CM | POA: Diagnosis not present

## 2023-09-02 DIAGNOSIS — M6281 Muscle weakness (generalized): Secondary | ICD-10-CM | POA: Diagnosis not present

## 2023-09-02 DIAGNOSIS — G309 Alzheimer's disease, unspecified: Secondary | ICD-10-CM | POA: Diagnosis not present

## 2023-09-03 DIAGNOSIS — G308 Other Alzheimer's disease: Secondary | ICD-10-CM | POA: Diagnosis not present

## 2023-09-03 DIAGNOSIS — R278 Other lack of coordination: Secondary | ICD-10-CM | POA: Diagnosis not present

## 2023-09-03 DIAGNOSIS — I10 Essential (primary) hypertension: Secondary | ICD-10-CM | POA: Diagnosis not present

## 2023-09-03 DIAGNOSIS — M6281 Muscle weakness (generalized): Secondary | ICD-10-CM | POA: Diagnosis not present

## 2023-09-04 DIAGNOSIS — M6281 Muscle weakness (generalized): Secondary | ICD-10-CM | POA: Diagnosis not present

## 2023-09-04 DIAGNOSIS — G309 Alzheimer's disease, unspecified: Secondary | ICD-10-CM | POA: Diagnosis not present

## 2023-09-04 DIAGNOSIS — R279 Unspecified lack of coordination: Secondary | ICD-10-CM | POA: Diagnosis not present

## 2023-09-05 DIAGNOSIS — M6281 Muscle weakness (generalized): Secondary | ICD-10-CM | POA: Diagnosis not present

## 2023-09-05 DIAGNOSIS — G308 Other Alzheimer's disease: Secondary | ICD-10-CM | POA: Diagnosis not present

## 2023-09-05 DIAGNOSIS — I10 Essential (primary) hypertension: Secondary | ICD-10-CM | POA: Diagnosis not present

## 2023-09-05 DIAGNOSIS — R278 Other lack of coordination: Secondary | ICD-10-CM | POA: Diagnosis not present

## 2023-09-09 DIAGNOSIS — R279 Unspecified lack of coordination: Secondary | ICD-10-CM | POA: Diagnosis not present

## 2023-09-09 DIAGNOSIS — M6281 Muscle weakness (generalized): Secondary | ICD-10-CM | POA: Diagnosis not present

## 2023-09-09 DIAGNOSIS — G309 Alzheimer's disease, unspecified: Secondary | ICD-10-CM | POA: Diagnosis not present

## 2023-09-11 DIAGNOSIS — M6281 Muscle weakness (generalized): Secondary | ICD-10-CM | POA: Diagnosis not present

## 2023-09-11 DIAGNOSIS — R279 Unspecified lack of coordination: Secondary | ICD-10-CM | POA: Diagnosis not present

## 2023-09-11 DIAGNOSIS — G309 Alzheimer's disease, unspecified: Secondary | ICD-10-CM | POA: Diagnosis not present

## 2023-09-12 DIAGNOSIS — I10 Essential (primary) hypertension: Secondary | ICD-10-CM | POA: Diagnosis not present

## 2023-09-12 DIAGNOSIS — G308 Other Alzheimer's disease: Secondary | ICD-10-CM | POA: Diagnosis not present

## 2023-09-12 DIAGNOSIS — M6281 Muscle weakness (generalized): Secondary | ICD-10-CM | POA: Diagnosis not present

## 2023-09-12 DIAGNOSIS — R278 Other lack of coordination: Secondary | ICD-10-CM | POA: Diagnosis not present

## 2023-09-16 DIAGNOSIS — M6281 Muscle weakness (generalized): Secondary | ICD-10-CM | POA: Diagnosis not present

## 2023-09-16 DIAGNOSIS — G309 Alzheimer's disease, unspecified: Secondary | ICD-10-CM | POA: Diagnosis not present

## 2023-09-16 DIAGNOSIS — R279 Unspecified lack of coordination: Secondary | ICD-10-CM | POA: Diagnosis not present

## 2023-09-17 DIAGNOSIS — G308 Other Alzheimer's disease: Secondary | ICD-10-CM | POA: Diagnosis not present

## 2023-09-17 DIAGNOSIS — M6281 Muscle weakness (generalized): Secondary | ICD-10-CM | POA: Diagnosis not present

## 2023-09-17 DIAGNOSIS — I10 Essential (primary) hypertension: Secondary | ICD-10-CM | POA: Diagnosis not present

## 2023-09-17 DIAGNOSIS — R278 Other lack of coordination: Secondary | ICD-10-CM | POA: Diagnosis not present

## 2023-09-19 DIAGNOSIS — R278 Other lack of coordination: Secondary | ICD-10-CM | POA: Diagnosis not present

## 2023-09-19 DIAGNOSIS — M6281 Muscle weakness (generalized): Secondary | ICD-10-CM | POA: Diagnosis not present

## 2023-09-19 DIAGNOSIS — I10 Essential (primary) hypertension: Secondary | ICD-10-CM | POA: Diagnosis not present

## 2023-09-19 DIAGNOSIS — G308 Other Alzheimer's disease: Secondary | ICD-10-CM | POA: Diagnosis not present

## 2023-09-24 DIAGNOSIS — G309 Alzheimer's disease, unspecified: Secondary | ICD-10-CM | POA: Diagnosis not present

## 2023-09-24 DIAGNOSIS — R279 Unspecified lack of coordination: Secondary | ICD-10-CM | POA: Diagnosis not present

## 2023-09-24 DIAGNOSIS — M6281 Muscle weakness (generalized): Secondary | ICD-10-CM | POA: Diagnosis not present

## 2023-09-25 DIAGNOSIS — M6281 Muscle weakness (generalized): Secondary | ICD-10-CM | POA: Diagnosis not present

## 2023-09-25 DIAGNOSIS — G309 Alzheimer's disease, unspecified: Secondary | ICD-10-CM | POA: Diagnosis not present

## 2023-09-25 DIAGNOSIS — R279 Unspecified lack of coordination: Secondary | ICD-10-CM | POA: Diagnosis not present

## 2023-09-28 ENCOUNTER — Encounter (HOSPITAL_COMMUNITY): Payer: Self-pay | Admitting: *Deleted

## 2023-09-28 ENCOUNTER — Inpatient Hospital Stay (HOSPITAL_COMMUNITY)
Admission: EM | Admit: 2023-09-28 | Discharge: 2023-10-01 | DRG: 690 | Disposition: A | Discharge status: Hospice | Source: Skilled Nursing Facility | Attending: Internal Medicine | Admitting: Internal Medicine

## 2023-09-28 ENCOUNTER — Emergency Department (HOSPITAL_COMMUNITY)

## 2023-09-28 ENCOUNTER — Other Ambulatory Visit: Payer: Self-pay

## 2023-09-28 DIAGNOSIS — R680 Hypothermia, not associated with low environmental temperature: Secondary | ICD-10-CM | POA: Diagnosis present

## 2023-09-28 DIAGNOSIS — R627 Adult failure to thrive: Secondary | ICD-10-CM | POA: Insufficient documentation

## 2023-09-28 DIAGNOSIS — R319 Hematuria, unspecified: Secondary | ICD-10-CM | POA: Diagnosis not present

## 2023-09-28 DIAGNOSIS — E782 Mixed hyperlipidemia: Secondary | ICD-10-CM | POA: Diagnosis present

## 2023-09-28 DIAGNOSIS — I959 Hypotension, unspecified: Secondary | ICD-10-CM | POA: Diagnosis present

## 2023-09-28 DIAGNOSIS — R636 Underweight: Secondary | ICD-10-CM | POA: Diagnosis present

## 2023-09-28 DIAGNOSIS — T68XXXA Hypothermia, initial encounter: Secondary | ICD-10-CM

## 2023-09-28 DIAGNOSIS — F0280 Dementia in other diseases classified elsewhere without behavioral disturbance: Secondary | ICD-10-CM | POA: Diagnosis present

## 2023-09-28 DIAGNOSIS — F0390 Unspecified dementia without behavioral disturbance: Secondary | ICD-10-CM

## 2023-09-28 DIAGNOSIS — Z87891 Personal history of nicotine dependence: Secondary | ICD-10-CM | POA: Diagnosis not present

## 2023-09-28 DIAGNOSIS — N3000 Acute cystitis without hematuria: Secondary | ICD-10-CM | POA: Diagnosis not present

## 2023-09-28 DIAGNOSIS — I10 Essential (primary) hypertension: Secondary | ICD-10-CM | POA: Diagnosis present

## 2023-09-28 DIAGNOSIS — E876 Hypokalemia: Secondary | ICD-10-CM | POA: Diagnosis present

## 2023-09-28 DIAGNOSIS — R9431 Abnormal electrocardiogram [ECG] [EKG]: Secondary | ICD-10-CM | POA: Diagnosis present

## 2023-09-28 DIAGNOSIS — I950 Idiopathic hypotension: Secondary | ICD-10-CM

## 2023-09-28 DIAGNOSIS — Z79890 Hormone replacement therapy (postmenopausal): Secondary | ICD-10-CM

## 2023-09-28 DIAGNOSIS — M47812 Spondylosis without myelopathy or radiculopathy, cervical region: Secondary | ICD-10-CM | POA: Diagnosis not present

## 2023-09-28 DIAGNOSIS — S0101XA Laceration without foreign body of scalp, initial encounter: Secondary | ICD-10-CM | POA: Diagnosis not present

## 2023-09-28 DIAGNOSIS — R58 Hemorrhage, not elsewhere classified: Secondary | ICD-10-CM | POA: Diagnosis not present

## 2023-09-28 DIAGNOSIS — Z79899 Other long term (current) drug therapy: Secondary | ICD-10-CM | POA: Diagnosis not present

## 2023-09-28 DIAGNOSIS — I6523 Occlusion and stenosis of bilateral carotid arteries: Secondary | ICD-10-CM | POA: Diagnosis not present

## 2023-09-28 DIAGNOSIS — R220 Localized swelling, mass and lump, head: Secondary | ICD-10-CM | POA: Diagnosis not present

## 2023-09-28 DIAGNOSIS — Z7401 Bed confinement status: Secondary | ICD-10-CM | POA: Diagnosis not present

## 2023-09-28 DIAGNOSIS — R0902 Hypoxemia: Secondary | ICD-10-CM | POA: Diagnosis not present

## 2023-09-28 DIAGNOSIS — Z5329 Procedure and treatment not carried out because of patient's decision for other reasons: Secondary | ICD-10-CM | POA: Diagnosis present

## 2023-09-28 DIAGNOSIS — Z743 Need for continuous supervision: Secondary | ICD-10-CM | POA: Diagnosis not present

## 2023-09-28 DIAGNOSIS — E860 Dehydration: Secondary | ICD-10-CM | POA: Insufficient documentation

## 2023-09-28 DIAGNOSIS — S0990XA Unspecified injury of head, initial encounter: Secondary | ICD-10-CM

## 2023-09-28 DIAGNOSIS — Z811 Family history of alcohol abuse and dependence: Secondary | ICD-10-CM | POA: Diagnosis not present

## 2023-09-28 DIAGNOSIS — Y92129 Unspecified place in nursing home as the place of occurrence of the external cause: Secondary | ICD-10-CM | POA: Diagnosis not present

## 2023-09-28 DIAGNOSIS — G309 Alzheimer's disease, unspecified: Secondary | ICD-10-CM | POA: Diagnosis present

## 2023-09-28 DIAGNOSIS — Z681 Body mass index (BMI) 19 or less, adult: Secondary | ICD-10-CM | POA: Diagnosis not present

## 2023-09-28 DIAGNOSIS — E039 Hypothyroidism, unspecified: Secondary | ICD-10-CM | POA: Diagnosis present

## 2023-09-28 DIAGNOSIS — E162 Hypoglycemia, unspecified: Secondary | ICD-10-CM | POA: Diagnosis present

## 2023-09-28 DIAGNOSIS — Z888 Allergy status to other drugs, medicaments and biological substances status: Secondary | ICD-10-CM

## 2023-09-28 DIAGNOSIS — Z7982 Long term (current) use of aspirin: Secondary | ICD-10-CM | POA: Diagnosis not present

## 2023-09-28 DIAGNOSIS — W19XXXA Unspecified fall, initial encounter: Secondary | ICD-10-CM | POA: Diagnosis not present

## 2023-09-28 DIAGNOSIS — Y92009 Unspecified place in unspecified non-institutional (private) residence as the place of occurrence of the external cause: Secondary | ICD-10-CM

## 2023-09-28 DIAGNOSIS — D62 Acute posthemorrhagic anemia: Secondary | ICD-10-CM | POA: Diagnosis present

## 2023-09-28 DIAGNOSIS — I700 Atherosclerosis of aorta: Secondary | ICD-10-CM | POA: Diagnosis not present

## 2023-09-28 DIAGNOSIS — S199XXA Unspecified injury of neck, initial encounter: Secondary | ICD-10-CM | POA: Diagnosis not present

## 2023-09-28 DIAGNOSIS — R279 Unspecified lack of coordination: Secondary | ICD-10-CM | POA: Diagnosis not present

## 2023-09-28 DIAGNOSIS — J439 Emphysema, unspecified: Secondary | ICD-10-CM | POA: Diagnosis not present

## 2023-09-28 DIAGNOSIS — Z66 Do not resuscitate: Secondary | ICD-10-CM | POA: Diagnosis not present

## 2023-09-28 DIAGNOSIS — Z90710 Acquired absence of both cervix and uterus: Secondary | ICD-10-CM

## 2023-09-28 DIAGNOSIS — R6889 Other general symptoms and signs: Secondary | ICD-10-CM | POA: Diagnosis not present

## 2023-09-28 DIAGNOSIS — L8990 Pressure ulcer of unspecified site, unspecified stage: Secondary | ICD-10-CM | POA: Insufficient documentation

## 2023-09-28 LAB — CBC WITH DIFFERENTIAL/PLATELET
Abs Immature Granulocytes: 0.02 10*3/uL (ref 0.00–0.07)
Basophils Absolute: 0 10*3/uL (ref 0.0–0.1)
Basophils Relative: 1 %
Eosinophils Absolute: 0 10*3/uL (ref 0.0–0.5)
Eosinophils Relative: 1 %
HCT: 25.1 % — ABNORMAL LOW (ref 36.0–46.0)
Hemoglobin: 7.9 g/dL — ABNORMAL LOW (ref 12.0–15.0)
Immature Granulocytes: 1 %
Lymphocytes Relative: 25 %
Lymphs Abs: 1.1 10*3/uL (ref 0.7–4.0)
MCH: 27.1 pg (ref 26.0–34.0)
MCHC: 31.5 g/dL (ref 30.0–36.0)
MCV: 86 fL (ref 80.0–100.0)
Monocytes Absolute: 0.3 10*3/uL (ref 0.1–1.0)
Monocytes Relative: 7 %
Neutro Abs: 2.9 10*3/uL (ref 1.7–7.7)
Neutrophils Relative %: 65 %
Platelets: 335 10*3/uL (ref 150–400)
RBC: 2.92 MIL/uL — ABNORMAL LOW (ref 3.87–5.11)
RDW: 17.2 % — ABNORMAL HIGH (ref 11.5–15.5)
WBC: 4.4 10*3/uL (ref 4.0–10.5)
nRBC: 0 % (ref 0.0–0.2)

## 2023-09-28 LAB — URINALYSIS, ROUTINE W REFLEX MICROSCOPIC
Bilirubin Urine: NEGATIVE
Glucose, UA: NEGATIVE mg/dL
Ketones, ur: NEGATIVE mg/dL
Leukocytes,Ua: NEGATIVE
Nitrite: POSITIVE — AB
Protein, ur: NEGATIVE mg/dL
Specific Gravity, Urine: 1.014 (ref 1.005–1.030)
pH: 5 (ref 5.0–8.0)

## 2023-09-28 LAB — GLUCOSE, CAPILLARY
Glucose-Capillary: 104 mg/dL — ABNORMAL HIGH (ref 70–99)
Glucose-Capillary: 132 mg/dL — ABNORMAL HIGH (ref 70–99)
Glucose-Capillary: 68 mg/dL — ABNORMAL LOW (ref 70–99)

## 2023-09-28 LAB — BASIC METABOLIC PANEL
Anion gap: 10 (ref 5–15)
BUN: 36 mg/dL — ABNORMAL HIGH (ref 8–23)
CO2: 32 mmol/L (ref 22–32)
Calcium: 8.1 mg/dL — ABNORMAL LOW (ref 8.9–10.3)
Chloride: 102 mmol/L (ref 98–111)
Creatinine, Ser: 0.81 mg/dL (ref 0.44–1.00)
GFR, Estimated: >60 mL/min (ref >60)
Glucose, Bld: 96 mg/dL (ref 70–99)
Potassium: 2.3 mmol/L — CL (ref 3.5–5.1)
Sodium: 144 mmol/L (ref 135–145)

## 2023-09-28 LAB — MRSA NEXT GEN BY PCR, NASAL: MRSA by PCR Next Gen: NOT DETECTED

## 2023-09-28 MED ORDER — PROCHLORPERAZINE EDISYLATE 10 MG/2ML IJ SOLN: 10.0000 mg | Freq: Four times a day (QID) | INTRAMUSCULAR | Status: DC | PRN

## 2023-09-28 MED ORDER — SODIUM CHLORIDE 0.9 % IV BOLUS
1000.0000 mL | Freq: Once | INTRAVENOUS | Status: AC
Start: 2023-09-28 — End: 2023-09-28
  Administered 2023-09-28: 1000 mL via INTRAVENOUS

## 2023-09-28 MED ORDER — SODIUM CHLORIDE 0.9 % IV SOLN
1.0000 g | INTRAVENOUS | Status: AC
  Administered 2023-09-29 – 2023-09-30 (×2): 1 g via INTRAVENOUS
  Filled 2023-09-28 (×2): qty 10

## 2023-09-28 MED ORDER — ORAL CARE MOUTH RINSE
15.0000 mL | OROMUCOSAL | Status: DC
  Administered 2023-09-29 – 2023-09-30 (×7): 15 mL via OROMUCOSAL

## 2023-09-28 MED ORDER — ACETAMINOPHEN 325 MG PO TABS: 650.0000 mg | ORAL_TABLET | Freq: Four times a day (QID) | ORAL | Status: DC | PRN

## 2023-09-28 MED ORDER — SODIUM CHLORIDE 0.9 % IV SOLN
1.0000 g | Freq: Once | INTRAVENOUS | Status: AC
  Administered 2023-09-28: 1 g via INTRAVENOUS
  Filled 2023-09-28: qty 10

## 2023-09-28 MED ORDER — BACITRACIN ZINC 500 UNIT/GM EX OINT
TOPICAL_OINTMENT | Freq: Two times a day (BID) | CUTANEOUS | Status: DC
  Filled 2023-09-28 (×6): qty 0.9

## 2023-09-28 MED ORDER — ORAL CARE MOUTH RINSE: 15.0000 mL | OROMUCOSAL | Status: DC | PRN

## 2023-09-28 MED ORDER — ACETAMINOPHEN 650 MG RE SUPP: 650.0000 mg | Freq: Four times a day (QID) | RECTAL | Status: DC | PRN

## 2023-09-28 MED ORDER — POTASSIUM CHLORIDE 10 MEQ/100ML IV SOLN
10.0000 meq | Freq: Once | INTRAVENOUS | Status: AC
  Administered 2023-09-28: 10 meq via INTRAVENOUS
  Filled 2023-09-28: qty 100

## 2023-09-28 MED ORDER — ENOXAPARIN SODIUM 30 MG/0.3ML IJ SOSY
30.0000 mg | PREFILLED_SYRINGE | INTRAMUSCULAR | Status: DC
  Administered 2023-09-29: 30 mg via SUBCUTANEOUS
  Filled 2023-09-28: qty 0.3

## 2023-09-28 MED ORDER — POTASSIUM CHLORIDE 10 MEQ/100ML IV SOLN
10.0000 meq | INTRAVENOUS | Status: AC
  Administered 2023-09-29 (×3): 10 meq via INTRAVENOUS
  Filled 2023-09-28: qty 100

## 2023-09-28 MED ORDER — DEXTROSE 50 % IV SOLN
12.5000 g | Freq: Once | INTRAVENOUS | Status: AC
  Administered 2023-09-28: 12.5 g via INTRAVENOUS
  Filled 2023-09-28: qty 50

## 2023-09-28 MED ORDER — MAGNESIUM SULFATE 2 GM/50ML IV SOLN
2.0000 g | INTRAVENOUS | Status: AC
  Administered 2023-09-28: 2 g via INTRAVENOUS
  Filled 2023-09-28: qty 50

## 2023-09-28 MED ORDER — ENSURE ENLIVE PO LIQD
237.0000 mL | Freq: Two times a day (BID) | ORAL | Status: DC
  Administered 2023-09-29: 237 mL via ORAL
  Administered 2023-09-29: 80 mL via ORAL
  Administered 2023-09-30: 237 mL via ORAL

## 2023-09-28 MED ORDER — CHLORHEXIDINE GLUCONATE CLOTH 2 % EX PADS
6.0000 | MEDICATED_PAD | Freq: Every day | CUTANEOUS | Status: DC
Start: 2023-09-28 — End: 2023-09-29
  Administered 2023-09-28 – 2023-09-29 (×2): 6 via TOPICAL

## 2023-09-28 MED ORDER — SODIUM CHLORIDE 0.9 % IV SOLN: INTRAVENOUS | Status: AC

## 2023-09-28 NOTE — Progress Notes (Signed)
Hypoglycemic Event  CBG: 68 2115  Treatment: D50 50 mL (25 gm)  Symptoms: None  Follow-up CBG: Time: 2130  CBG Result:132  Possible Reasons for Event: Inadequate meal intake  Comments/MD notified: Bosie Helper

## 2023-09-28 NOTE — H&P (Addendum)
History and Physical    Patient: April [name]:[id number] DOB: Aug 20, 1932 DOA: 09/28/2023 DOS: the patient was seen and examined on 09/28/2023 PCP: Galvin Proffer, MD  Patient coming from: [place] [facility]  Chief Complaint:  Chief Complaint  Patient presents with   Fall   HPI: April [name] is a 89 y.o. female with medical history significant of hypertension, hypothyroidism, dementia, depression who presents to the emergency department from memory care via EMS due to fall sustained at the facility.  Patient had an unwitnessed fall and struck the back of her head, she was noted with bleeding, mental status was not significantly altered.  The cause of fall was unknown and there was no witnessed seizures.  EMS was activated and patient was sent to the ED for further evaluation and management.  ED Course:  In the emergency department, she was hypothermic with temperature of 93.57F, but other vital signs are within normal range.  Workup in the ED showed normocytic anemia, BMP was normal except for potassium of 2.3 and BUN of 36.  Urinalysis was suggestive of UTI CT head without contrast showed no acute peritoneal findings.  Soft tissue swelling of the and left parietal scalp with laceration.  No underlying calvarial fracture. CT cervical spine without contrast showed no acute fracture or traumatic listhesis of the cervical spine. Chest x-ray showed no active cardiopulmonary disease. Patient was treated with IV ceftriaxone due to UTI.  Bair hugger was provided due to hypothermia.  Potassium was replenished.  Hospitalist was asked to admit patient for further evaluation and management.  Review of Systems: Review of systems as noted in the HPI. All other systems reviewed and are negative.   Past Medical History:  Diagnosis Date   Alzheimer disease (HCC)    Arthritis    Cognitive changes    Hyperlipidemia    Hypertension    Thyroid disease    Past Surgical History:   Procedure Laterality Date   FOOT SURGERY     Arthritis    HAND SURGERY     Arthritis    thumb surgery Left    [facility]    VAGINAL HYSTERECTOMY      Social History:  reports that she has quit smoking. She has never used smokeless tobacco. She reports that she does not currently use alcohol after a past usage of about 1.0 standard drink of alcohol per week. She reports that she does not use drugs.   Allergies  Allergen Reactions   Acid Reducer [Cimetidine] Nausea Only   Mevacor [Lovastatin] Nausea Only    Family History  Problem Relation Age of Onset   Pneumonia Father    Alcohol abuse Father    Dementia Sister      Prior to Admission medications   Medication Sig Start Date End Date Taking? Authorizing Provider  aspirin 81 MG tablet Take 1 tablet (81 mg total) by mouth daily with breakfast. 11/25/22   Emokpae, Courage, MD  atorvastatin (LIPITOR) 20 MG tablet Take 20 mg by mouth daily. 11/20/22   [provider]  Ferrous Sulfate (IRON) 325 (65 Fe) MG TABS Take 1 tablet by mouth daily. 12/11/22   [provider]  folic acid (FOLVITE) 1 MG tablet Take 1 mg by mouth daily. 01/15/23   [provider]  levothyroxine (SYNTHROID) 75 MCG tablet Take 1 tablet (75 mcg total) by mouth daily before breakfast. 01/09/22   Daphine Deutscher, [name], FNP  lisinopril (ZESTRIL) 20 MG tablet Take 1  tablet (20 mg total) by mouth daily. 11/25/22   Shon Hale, MD  melatonin 3 MG TABS tablet Take 3 mg by mouth at bedtime.    [provider]  memantine (NAMENDA XR) 21 MG CP24 24 hr capsule TAKE (1) CAPSULE DAILY 01/09/22   Daphine Deutscher, [name], FNP  mirtazapine (REMERON) 7.5 MG tablet Take 7.5 mg by mouth at bedtime. 04/11/23   [provider]  QUEtiapine (SEROQUEL) 25 MG tablet Take 1 tablet (25 mg total) by mouth at bedtime. Patient taking differently: Take 25 mg by mouth 2 (two) times daily. 11/25/22   Shon Hale, MD  sertraline (ZOLOFT) 50 MG  tablet Take 1 tablet (50 mg total) by mouth daily. 01/09/22   Bennie Pierini, FNP    Physical Exam: BP (!) 82/61 (BP Location: Right Arm)   Pulse (!) 101   Temp 97.6 F (36.4 C) (Axillary)   Resp (!) 27   Ht 5\' 4"  (1.626 m)   Wt 39.9 kg   SpO2 99%   BMI 15.10 kg/m   General: 89 y.o. year-old female cachectic, resting in bed with eyes closed and in no acute distress.   HEENT: Laceration s/p repair to the occiput. Neck: Supple, trachea medial Cardiovascular: Regular rate and rhythm with no rubs or gallops.  No thyromegaly or JVD noted.  No lower extremity edema. 2/4 pulses in all 4 extremities. Respiratory: Clear to auscultation with no wheezes or rales. Good inspiratory effort. Abdomen: Soft, nontender nondistended with normal bowel sounds x4 quadrants. Muskuloskeletal: No cyanosis, clubbing or edema noted bilaterally Neuro: Patient moves all 4 extremities, sensation, reflexes intact Skin: No ulcerative lesions noted or rashes Psychiatry: Mood is appropriate for condition and setting          Labs on Admission:  Basic Metabolic Panel: Recent Labs  Lab 09/28/23 1530  NA 144  K 2.3*  CL 102  CO2 32  GLUCOSE 96  BUN 36*  CREATININE 0.81  CALCIUM 8.1*   Liver Function Tests: No results for input(s): "AST", "ALT", "ALKPHOS", "BILITOT", "PROT", "ALBUMIN" in the last 168 hours. No results for input(s): "LIPASE", "AMYLASE" in the last 168 hours. No results for input(s): "AMMONIA" in the last 168 hours. CBC: Recent Labs  Lab 09/28/23 1530  WBC 4.4  NEUTROABS 2.9  HGB 7.9*  HCT 25.1*  MCV 86.0  PLT 335   Cardiac Enzymes: No results for input(s): "CKTOTAL", "CKMB", "CKMBINDEX", "TROPONINI" in the last 168 hours.  BNP (last 3 results) No results for input(s): "BNP" in the last 8760 hours.  ProBNP (last 3 results) No results for input(s): "PROBNP" in the last 8760 hours.  CBG: Recent Labs  Lab 09/28/23 2129 09/28/23 2144  GLUCAP 68* 132*     Radiological Exams on Admission: CT Cervical Spine Wo Contrast  Result Date: 09/28/2023 CLINICAL DATA:  Fall, blunt trauma EXAM: CT CERVICAL SPINE WITHOUT CONTRAST TECHNIQUE: Multidetector CT imaging of the cervical spine was performed without intravenous contrast. Multiplanar CT image reconstructions were also generated. RADIATION DOSE REDUCTION: This exam was performed according to the departmental dose-optimization program which includes automated exposure control, adjustment of the mA and/or kV according to patient size and/or use of iterative reconstruction technique. COMPARISON:  06/13/2023 FINDINGS: Alignment: Facet joints are aligned without dislocation or traumatic listhesis. Dens and lateral masses are aligned. Degenerative facet-mediated stepwise anterolisthesis at C4-5, C5-6, C6-7, and C7-T1. Skull base and vertebrae: No acute fracture. No primary bone lesion or focal pathologic process. Soft tissues and spinal canal: No prevertebral fluid  or swelling. No visible canal hematoma. Disc levels:  Multilevel facet-predominant cervical spondylosis. Upper chest: Emphysema. Other: Bilateral carotid atherosclerosis. IMPRESSION: No acute fracture or traumatic listhesis of the cervical spine. Electronically Signed   By: Duanne Guess D.O.   On: 09/28/2023 17:36   CT Head Wo Contrast  Result Date: 09/28/2023 CLINICAL DATA:  Fall, scalp laceration EXAM: CT HEAD WITHOUT CONTRAST TECHNIQUE: Contiguous axial images were obtained from the base of the skull through the vertex without intravenous contrast. RADIATION DOSE REDUCTION: This exam was performed according to the departmental dose-optimization program which includes automated exposure control, adjustment of the mA and/or kV according to patient size and/or use of iterative reconstruction technique. COMPARISON:  08/06/2023 FINDINGS: Brain: No evidence of acute infarction, hemorrhage, hydrocephalus, extra-axial collection or mass lesion/mass  effect. Scattered low-density changes within the periventricular and subcortical white matter most compatible with chronic microvascular ischemic change. Mild diffuse cerebral volume loss. Vascular: Atherosclerotic calcifications involving the large vessels of the skull base. No unexpected hyperdense vessel. Skull: Normal. Negative for fracture or focal lesion. Sinuses/Orbits: No acute finding. Other: Soft tissue swelling of the high left parietal scalp with laceration. IMPRESSION: 1. No acute intracranial findings. 2. Soft tissue swelling of the high left parietal scalp with laceration. No underlying calvarial fracture. Electronically Signed   By: Duanne Guess D.O.   On: 09/28/2023 17:33   DG Chest Port 1 View  Result Date: 09/28/2023 CLINICAL DATA:  Altered EXAM: PORTABLE CHEST 1 VIEW COMPARISON:  08/06/2023 FINDINGS: The heart size and mediastinal contours are stable. Aortic atherosclerosis. No focal airspace consolidation, pleural effusion, or pneumothorax. The visualized skeletal structures are unremarkable. IMPRESSION: No active cardiopulmonary disease. Electronically Signed   By: Duanne Guess D.O.   On: 09/28/2023 17:31    EKG: I independently viewed the EKG done and my findings are as followed: Normal sinus rhythm at a rate of 68 bpm with ventricular premature complexes and QTc of 515 ms  Assessment/Plan Present on Admission:  Acute cystitis  Essential hypertension  Hypotension  Dementia without behavioral disturbance (HCC)  Acquired hypothyroidism  Principal Problem:   Acute cystitis Active Problems:   Acquired hypothyroidism   Dementia without behavioral disturbance (HCC)   Hypotension   Essential hypertension   Fall at home, initial encounter   Scalp laceration   Dehydration   Prolonged QT interval   Hypothermia   Underweight (BMI < 18.5)   Failure to thrive in adult  Acute cystitis Urinalysis positive for UTI Patient was started on IV ceftriaxone, we shall  continue same at this time Urine culture pending  Fall at home Continue fall precaution Continue PT/OT eval and treat  Scalp laceration s/p repair Stable, continue to monitor  Hypothermia - resolved  Hypotension - resolved  Hypokalemia K+ is 2.3 K+ will be replenished Please monitor for AM K+ for further replenishmemnt  Dehydration Continue IV hydration  Hypoglycemia D50 was given Continue CBG every 4 hours  Prolonged QT interval Avoid QT prolonging drugs Magnesium was provided K+ 2.3, this will be replenished Repeat EKG in the morning  Underweight/failure to thrive in adult Protein supplement will be provided Dietitian will be consulted and we shall await further recommendation  Essential hypertension Lisinopril will be held at this time due to soft BP  Mixed hyperlipidemia Continue Lipitor  Acquired hypothyroidism Continue Synthroid  Dementia Continue Namenda  DVT prophylaxis: Lovenox  Advance Care Planning: CODE STATUS: Full code  Consults: Dietitian  Family Communication: None at bedside  Severity of Illness:  The appropriate patient status for this patient is INPATIENT. Inpatient status is judged to be reasonable and necessary in order to provide the required intensity of service to ensure the patient's safety. The patient's presenting symptoms, physical exam findings, and initial radiographic and laboratory data in the context of their chronic comorbidities is felt to place them at high risk for further clinical deterioration. Furthermore, it is not anticipated that the patient will be medically stable for discharge from the hospital within 2 midnights of admission.   * I certify that at the point of admission it is my clinical judgment that the patient will require inpatient hospital care spanning beyond 2 midnights from the point of admission due to high intensity of service, high risk for further deterioration and high frequency of surveillance  required.*  Author: Frankey Shown, DO 09/28/2023 10:54 PM  For on call review [email or web address].

## 2023-09-28 NOTE — ED Notes (Signed)
Barehugger removed, pt's oral temp 98.3

## 2023-09-28 NOTE — ED Provider Notes (Addendum)
[place] [facility]ERGENCY DEPARTMENT AT [facility] Provider Note   CSN: [id number] Arrival date & time: 09/28/23  1326     History  Chief Complaint  Patient presents with   April [name] is a 89 y.o. female.   Fall  This patient is an 89 year old female, she has a history of seizures on Keppra, she has hypothyroidism, she is on lisinopril and she has dementia, she is coming from [facility] where she had an unwitnessed fall and struck the back of her head, she was found with bleeding, she does not appear to be significantly altered had no witnessed seizure no vomiting and has no complaints, she does not remember the events.  She was found to be hypothermic on arrival and placed on a Bair hugger     Home Medications Prior to Admission medications   Medication Sig Start Date End Date Taking? Authorizing Provider  aspirin 81 MG tablet Take 1 tablet (81 mg total) by mouth daily with breakfast. 11/25/22   Emokpae, Courage, MD  atorvastatin (LIPITOR) 20 MG tablet Take 20 mg by mouth daily. 11/20/22   [provider]  Ferrous Sulfate (IRON) 325 (65 Fe) MG TABS Take 1 tablet by mouth daily. 12/11/22   [provider]  folic acid (FOLVITE) 1 MG tablet Take 1 mg by mouth daily. 01/15/23   [provider]  levothyroxine (SYNTHROID) 75 MCG tablet Take 1 tablet (75 mcg total) by mouth daily before breakfast. 01/09/22   Daphine Deutscher, [name], FNP  lisinopril (ZESTRIL) 20 MG tablet Take 1 tablet (20 mg total) by mouth daily. 11/25/22   Shon Hale, MD  melatonin 3 MG TABS tablet Take 3 mg by mouth at bedtime.    [provider]  memantine (NAMENDA XR) 21 MG CP24 24 hr capsule TAKE (1) CAPSULE DAILY 01/09/22   Daphine Deutscher, [name], FNP  mirtazapine (REMERON) 7.5 MG tablet Take 7.5 mg by mouth at bedtime. 04/11/23   [provider]  QUEtiapine (SEROQUEL) 25 MG tablet Take 1 tablet (25 mg total) by mouth at  bedtime. Patient taking differently: Take 25 mg by mouth 2 (two) times daily. 11/25/22   Shon Hale, MD  sertraline (ZOLOFT) 50 MG tablet Take 1 tablet (50 mg total) by mouth daily. 01/09/22   Daphine Deutscher [name], FNP      Allergies    Acid reducer [cimetidine] and Mevacor [lovastatin]    Review of Systems   Review of Systems  Unable to perform ROS: Dementia    Physical Exam Updated Vital Signs BP (!) 105/53   Pulse 96   Temp 98.3 F (36.8 C) (Oral)   Resp 16   Ht 1.626 m (5\' 4" )   Wt 51.4 kg   SpO2 96%   BMI 19.45 kg/m  Physical Exam Vitals and nursing note reviewed.  Constitutional:      General: She is not in acute distress.    Appearance: She is well-developed.  HENT:     Head: Normocephalic.     Comments: Laceration to the occiput    Mouth/Throat:     Pharynx: No oropharyngeal exudate.  Eyes:     General: No scleral icterus.       Right eye: No discharge.        Left eye: No discharge.     Conjunctiva/sclera: Conjunctivae normal.     Pupils: Pupils are equal, round, and reactive to light.  Neck:     Thyroid: No thyromegaly.  Vascular: No JVD.  Cardiovascular:     Rate and Rhythm: Normal rate and regular rhythm.     Heart sounds: Normal heart sounds. No murmur heard.    No friction rub. No gallop.  Pulmonary:     Effort: Pulmonary effort is normal. No respiratory distress.     Breath sounds: Normal breath sounds. No wheezing or rales.  Abdominal:     General: Bowel sounds are normal. There is no distension.     Palpations: Abdomen is soft. There is no mass.     Tenderness: There is no abdominal tenderness.  Musculoskeletal:        General: No tenderness. Normal range of motion.     Cervical back: Normal range of motion and neck supple.  Lymphadenopathy:     Cervical: No cervical adenopathy.  Skin:    General: Skin is warm and dry.     Findings: No erythema or rash.  Neurological:     Mental Status: She is alert.     Coordination:  Coordination normal.     Comments: The patient awakes to voice and is able to move all 4 extremities, she can lift both legs and grip with both hands with very strong grips, she does not answer any other questions, cranial nerves III through XII are otherwise normal  Psychiatric:        Behavior: Behavior normal.     ED Results / Procedures / Treatments   Labs (all labs ordered are listed, but only abnormal results are displayed) Labs Reviewed  CBC WITH DIFFERENTIAL/PLATELET - Abnormal; Notable for the following components:      Result Value   RBC 2.92 (*)    Hemoglobin 7.9 (*)    HCT 25.1 (*)    RDW 17.2 (*)    All other components within normal limits  BASIC METABOLIC PANEL - Abnormal; Notable for the following components:   Potassium 2.3 (*)    BUN 36 (*)    Calcium 8.1 (*)    All other components within normal limits  URINALYSIS, ROUTINE W REFLEX MICROSCOPIC - Abnormal; Notable for the following components:   APPearance HAZY (*)    Hgb urine dipstick SMALL (*)    Nitrite POSITIVE (*)    Bacteria, UA MANY (*)    All other components within normal limits  URINE CULTURE    EKG EKG Interpretation Date/Time:  Saturday September 28 2023 15:25:42 EDT Ventricular Rate:  68 PR Interval:  317 QRS Duration:  80 QT Interval:  484 QTC Calculation: 515 R Axis:   37  Text Interpretation: Sinus rhythm Ventricular premature complex Prolonged PR interval Nonspecific repol abnormality, diffuse leads Prolonged QT interval Confirmed by Eber Hong ([id number]) on 09/28/2023 4:11:09 PM  Radiology CT Cervical Spine Wo Contrast  Result Date: 09/28/2023 CLINICAL DATA:  Fall, blunt trauma EXAM: CT CERVICAL SPINE WITHOUT CONTRAST TECHNIQUE: Multidetector CT imaging of the cervical spine was performed without intravenous contrast. Multiplanar CT image reconstructions were also generated. RADIATION DOSE REDUCTION: This exam was performed according to the departmental dose-optimization program which  includes automated exposure control, adjustment of the mA and/or kV according to patient size and/or use of iterative reconstruction technique. COMPARISON:  06/13/2023 FINDINGS: Alignment: Facet joints are aligned without dislocation or traumatic listhesis. Dens and lateral masses are aligned. Degenerative facet-mediated stepwise anterolisthesis at C4-5, C5-6, C6-7, and C7-T1. Skull base and vertebrae: No acute fracture. No primary bone lesion or focal pathologic process. Soft tissues and spinal canal: No prevertebral fluid or  swelling. No visible canal hematoma. Disc levels:  Multilevel facet-predominant cervical spondylosis. Upper chest: Emphysema. Other: Bilateral carotid atherosclerosis. IMPRESSION: No acute fracture or traumatic listhesis of the cervical spine. Electronically Signed   By: Duanne Guess D.O.   On: 09/28/2023 17:36   CT Head Wo Contrast  Result Date: 09/28/2023 CLINICAL DATA:  Fall, scalp laceration EXAM: CT HEAD WITHOUT CONTRAST TECHNIQUE: Contiguous axial images were obtained from the base of the skull through the vertex without intravenous contrast. RADIATION DOSE REDUCTION: This exam was performed according to the departmental dose-optimization program which includes automated exposure control, adjustment of the mA and/or kV according to patient size and/or use of iterative reconstruction technique. COMPARISON:  08/06/2023 FINDINGS: Brain: No evidence of acute infarction, hemorrhage, hydrocephalus, extra-axial collection or mass lesion/mass effect. Scattered low-density changes within the periventricular and subcortical white matter most compatible with chronic microvascular ischemic change. Mild diffuse cerebral volume loss. Vascular: Atherosclerotic calcifications involving the large vessels of the skull base. No unexpected hyperdense vessel. Skull: Normal. Negative for fracture or focal lesion. Sinuses/Orbits: No acute finding. Other: Soft tissue swelling of the high left parietal  scalp with laceration. IMPRESSION: 1. No acute intracranial findings. 2. Soft tissue swelling of the high left parietal scalp with laceration. No underlying calvarial fracture. Electronically Signed   By: Duanne Guess D.O.   On: 09/28/2023 17:33   DG Chest Port 1 View  Result Date: 09/28/2023 CLINICAL DATA:  Altered EXAM: PORTABLE CHEST 1 VIEW COMPARISON:  08/06/2023 FINDINGS: The heart size and mediastinal contours are stable. Aortic atherosclerosis. No focal airspace consolidation, pleural effusion, or pneumothorax. The visualized skeletal structures are unremarkable. IMPRESSION: No active cardiopulmonary disease. Electronically Signed   By: Duanne Guess D.O.   On: 09/28/2023 17:31    Procedures .Critical Care  Performed by: Eber Hong, MD Authorized by: Eber Hong, MD   Critical care provider statement:    Critical care time (minutes):  30   Critical care time was exclusive of:  Separately billable procedures and treating other patients and teaching time   Critical care was necessary to treat or prevent imminent or life-threatening deterioration of the following conditions: Severe hypokalemia.   Critical care was time spent personally by me on the following activities:  Development of treatment plan with patient or surrogate, discussions with consultants, evaluation of patient's response to treatment, examination of patient, obtaining history from patient or surrogate, review of old charts, re-evaluation of patient's condition, pulse oximetry, ordering and review of radiographic studies, ordering and review of laboratory studies and ordering and performing treatments and interventions   I assumed direction of critical care for this patient from another provider in my specialty: no     Care discussed with: admitting provider   Comments:       Marland KitchenMarland KitchenLaceration Repair  Date/Time: 09/28/2023 6:04 PM  Performed by: Eber Hong, MD Authorized by: Eber Hong, MD   Consent:     Consent obtained:  Emergent situation   Risks discussed:  Infection   Alternatives discussed:  No treatment Universal protocol:    Patient identity confirmed:  Arm band Anesthesia:    Anesthesia method:  None Laceration details:    Location:  Scalp   Length (cm):  4   Depth (mm):  3 Pre-procedure details:    Preparation:  Patient was prepped and draped in usual sterile fashion and imaging obtained to evaluate for foreign bodies Exploration:    Imaging obtained: x-ray     Imaging outcome: foreign body not noted  Wound exploration: wound explored through full range of motion and entire depth of wound visualized     Contaminated: no   Treatment:    Area cleansed with:  Saline   Amount of cleaning:  Extensive   Visualized foreign bodies/material removed: no   Skin repair:    Repair method:  Staples   Number of staples:  3 Approximation:    Approximation:  Close Repair type:    Repair type:  Simple Post-procedure details:    Dressing:  Antibiotic ointment   Procedure completion:  Tolerated well, no immediate complications Comments:           Medications Ordered in ED Medications  bacitracin ointment (has no administration in time range)  potassium chloride 10 mEq in 100 mL IVPB (10 mEq Intravenous New Bag/Given 09/28/23 1741)  cefTRIAXone (ROCEPHIN) 1 g in sodium chloride 0.9 % 100 mL IVPB (has no administration in time range)  magnesium sulfate IVPB 2 g 50 mL (0 g Intravenous Stopped 09/28/23 1741)  potassium chloride 10 mEq in 100 mL IVPB (0 mEq Intravenous Stopped 09/28/23 1741)    ED Course/ Medical Decision Making/ A&P                                 Medical Decision Making Amount and/or Complexity of Data Reviewed Labs: ordered. Radiology: ordered. ECG/medicine tests: ordered.  Risk OTC drugs. Prescription drug management. Decision regarding hospitalization.   She has no obvious deformities of the arms of the legs, she has an isolated laceration of  the back of the head, she does not complain of back pain when I palpate her spine, she is neurologically intact.  Vital signs are unremarkable except for her low temperature, will check urine labs and CT scan of the head, she will be placed on a Bair hugger to help her with warmth.  She is not on any anticoagulants and has not had any seizure activity here   This patient presents to the ED for concern of fall, weakness differential diagnosis includes and action, intracranial hemorrhage, electrolyte abnormalities, arrhythmia, seizure    Additional history obtained:  Additional history obtained from medical record External records from outside source obtained and reviewed including multiple visits to the ER for falls including twice in August, she was also seen 3 times in July, she is at a history of a seizure back in July as well, no recent admissions to the hospital since she had severe hypokalemia back in May of this year.   Lab Tests:  I Ordered, and personally interpreted labs.  The pertinent results include: Hypokalemic at 2.3, QT prolonged on EKG   Cardiac monitor: Cardiac monitor: Mild sinus bradycardia,  EKG performed at 3:25 PM on September 28, 2023 showing 68 bpm with a prolonged QT over 500, no other signs of acute ischemia or arrhythmia, there is a prolonged PR interval   Imaging Studies ordered:  I ordered imaging studies including CT scan of the head and cervical spine I independently visualized and interpreted imaging which showed no signs of acute intracranial hemorrhage, this confirms the laceration which we can see visually I agree with the radiologist interpretation   Medicines ordered and prescription drug management:  I ordered medication including potassium supplementation as well as antibiotics for UTI and hypokalemia with QT prolongation Reevaluation of the patient after these medicines showed that the patient continued to appear ill I have reviewed the patients  home  medicines and have made adjustments as needed  Consultations: Will discuss with hospitalist for admission.  I discussed the case with Dr. Thomes Dinning at 7:35 PM, he has willing to admit this patient    Problem List / ED Course:  This patient is very ill, she has had a laceration of her scalp from a fall, I do not see any other signs of significant injuries but she did require scalp laceration repair, see the procedure note.  She has a QT that is prolonged that has required potassium supplementation and will need to be cardiac monitored and brought into the hospital.  Thankfully she is not vomiting, she has not had arrhythmia   Social Determinants of Health:   Dementia          Final Clinical Impression(s) / ED Diagnoses Final diagnoses:  Acute cystitis without hematuria  Hypokalemia  Prolonged QT interval  Laceration of scalp, initial encounter  Injury of head, initial encounter    Rx / DC Orders ED Discharge Orders     None         Eber Hong, MD 09/28/23 1806    Eber Hong, MD 09/28/23 [phone]

## 2023-09-28 NOTE — ED Triage Notes (Signed)
Pt BIB [facility] due to fall. Pt with dementia.  No report of blood thinners. Lac to back of scalp.  Reported that pt is at baseline. Pt will open eyes, denies any pain.

## 2023-09-28 NOTE — ED Notes (Signed)
Barehugger applied due to rectal temp at 93.6

## 2023-09-29 DIAGNOSIS — N3000 Acute cystitis without hematuria: Secondary | ICD-10-CM

## 2023-09-29 LAB — CBC
HCT: 22.7 % — ABNORMAL LOW (ref 36.0–46.0)
Hemoglobin: 6.7 g/dL — CL (ref 12.0–15.0)
MCH: 27 pg (ref 26.0–34.0)
MCHC: 29.5 g/dL — ABNORMAL LOW (ref 30.0–36.0)
MCV: 91.5 fL (ref 80.0–100.0)
Platelets: 335 10*3/uL (ref 150–400)
RBC: 2.48 MIL/uL — ABNORMAL LOW (ref 3.87–5.11)
RDW: 17.2 % — ABNORMAL HIGH (ref 11.5–15.5)
WBC: 6.5 10*3/uL (ref 4.0–10.5)
nRBC: 0.5 % — ABNORMAL HIGH (ref 0.0–0.2)

## 2023-09-29 LAB — HEMOGLOBIN AND HEMATOCRIT, BLOOD
HCT: 21.5 % — ABNORMAL LOW (ref 36.0–46.0)
HCT: 19.4 % — ABNORMAL LOW (ref 36.0–46.0)
Hemoglobin: 6.6 g/dL — CL (ref 12.0–15.0)
Hemoglobin: 6 g/dL — CL (ref 12.0–15.0)

## 2023-09-29 LAB — COMPREHENSIVE METABOLIC PANEL
ALT: 20 U/L (ref 0–44)
AST: 43 U/L — ABNORMAL HIGH (ref 15–41)
Albumin: 2.3 g/dL — ABNORMAL LOW (ref 3.5–5.0)
Alkaline Phosphatase: 95 U/L (ref 38–126)
Anion gap: 12 (ref 5–15)
BUN: 36 mg/dL — ABNORMAL HIGH (ref 8–23)
CO2: 23 mmol/L (ref 22–32)
Calcium: 7.7 mg/dL — ABNORMAL LOW (ref 8.9–10.3)
Chloride: 108 mmol/L (ref 98–111)
Creatinine, Ser: 1.18 mg/dL — ABNORMAL HIGH (ref 0.44–1.00)
GFR, Estimated: 44 mL/min — ABNORMAL LOW (ref >60)
Glucose, Bld: 80 mg/dL (ref 70–99)
Potassium: 2.9 mmol/L — ABNORMAL LOW (ref 3.5–5.1)
Sodium: 143 mmol/L (ref 135–145)
Total Bilirubin: 1 mg/dL (ref 0.3–1.2)
Total Protein: 5.7 g/dL — ABNORMAL LOW (ref 6.5–8.1)

## 2023-09-29 LAB — MAGNESIUM: Magnesium: 2 mg/dL (ref 1.7–2.4)

## 2023-09-29 LAB — GLUCOSE, CAPILLARY
Glucose-Capillary: 100 mg/dL — ABNORMAL HIGH (ref 70–99)
Glucose-Capillary: 123 mg/dL — ABNORMAL HIGH (ref 70–99)
Glucose-Capillary: 129 mg/dL — ABNORMAL HIGH (ref 70–99)
Glucose-Capillary: 81 mg/dL (ref 70–99)
Glucose-Capillary: 82 mg/dL (ref 70–99)

## 2023-09-29 LAB — POTASSIUM: Potassium: 3 mmol/L — ABNORMAL LOW (ref 3.5–5.1)

## 2023-09-29 LAB — PHOSPHORUS: Phosphorus: 1.2 mg/dL — ABNORMAL LOW (ref 2.5–4.6)

## 2023-09-29 MED ORDER — MEMANTINE HCL ER 7 MG PO CP24
21.0000 mg | ORAL_CAPSULE | Freq: Every day | ORAL | Status: DC
  Administered 2023-09-30: 21 mg via ORAL
  Filled 2023-09-29 (×4): qty 3

## 2023-09-29 MED ORDER — SODIUM CHLORIDE 0.9% IV SOLUTION: Freq: Once | INTRAVENOUS | Status: DC

## 2023-09-29 MED ORDER — MEMANTINE HCL ER 7 MG PO CP24
21.0000 mg | ORAL_CAPSULE | Freq: Every day | ORAL | Status: DC
  Filled 2023-09-29 (×2): qty 1

## 2023-09-29 MED ORDER — ATORVASTATIN CALCIUM 20 MG PO TABS
20.0000 mg | ORAL_TABLET | Freq: Every day | ORAL | Status: DC
Start: 2023-09-29 — End: 2023-10-01
  Administered 2023-09-29 – 2023-09-30 (×2): 20 mg via ORAL
  Filled 2023-09-29 (×2): qty 1

## 2023-09-29 MED ORDER — LEVOTHYROXINE SODIUM 75 MCG PO TABS
75.0000 ug | ORAL_TABLET | Freq: Every day | ORAL | Status: DC
  Administered 2023-09-30: 75 ug via ORAL
  Filled 2023-09-29: qty 1

## 2023-09-29 MED ORDER — PANTOPRAZOLE SODIUM 40 MG IV SOLR
40.0000 mg | INTRAVENOUS | Status: DC
  Administered 2023-09-29 – 2023-09-30 (×2): 40 mg via INTRAVENOUS
  Filled 2023-09-29 (×2): qty 10

## 2023-09-29 MED ORDER — POTASSIUM CHLORIDE CRYS ER 20 MEQ PO TBCR
40.0000 meq | EXTENDED_RELEASE_TABLET | Freq: Two times a day (BID) | ORAL | Status: AC
  Administered 2023-09-29 (×2): 40 meq via ORAL
  Filled 2023-09-29 (×2): qty 2

## 2023-09-29 MED ORDER — POTASSIUM CHLORIDE 10 MEQ/100ML IV SOLN
10.0000 meq | INTRAVENOUS | Status: AC
  Administered 2023-09-29 (×3): 10 meq via INTRAVENOUS
  Filled 2023-09-29: qty 100

## 2023-09-29 NOTE — TOC Initial Note (Signed)
Transition of Care [facility]) - Initial/Assessment Note    Patient Details  Name: April [name] MRN: [id number] Date of Birth: November 02, 1932  [facility]) CM/SW Contact:    Villa Herb, LCSWA Phone Number: 09/29/2023, 11:14 AM  Clinical Narrative:                 Pt arrived to hospital from [facility] [place]. CSW attempted to reach pts son, unable to at this time. CSW to follow up with ALF once director Olegario Messier is back in office tomorrow. TOC to follow.   Expected Discharge Plan: Assisted Living Barriers to Discharge: Continued Medical Work up   Patient Goals and CMS Choice Patient states their goals for this hospitalization and ongoing recovery are:: get better CMS Medicare.gov Compare Post Acute Care list provided to:: Patient Represenative (must comment) Choice offered to / list presented to : Adult Children      Expected Discharge Plan and Services In-house Referral: Clinical Social Work Discharge Planning Services: CM Consult   Living arrangements for the past 2 months: Assisted Living Facility                                      Prior Living Arrangements/Services Living arrangements for the past 2 months: Assisted Living Facility Lives with:: Facility Resident Patient language and need for interpreter reviewed:: Yes Do you feel safe going back to the place where you live?: Yes      Need for Family Participation in Patient Care: Yes (Comment) Care giver support system in place?: Yes (comment)   Criminal Activity/Legal Involvement Pertinent to Current Situation/Hospitalization: No - Comment as needed  Activities of Daily Living      Permission Sought/Granted                  Emotional Assessment         Alcohol / Substance Use: Not Applicable Psych Involvement: No (comment)  Admission diagnosis:  Acute cystitis [N30.00] Hypokalemia [E87.6] Prolonged QT interval [R94.31] Acute cystitis without hematuria [N30.00] Injury of  head, initial encounter [S09.90XA] Laceration of scalp, initial encounter [S01.01XA] Patient Active Problem List   Diagnosis Date Noted   Acute cystitis 09/28/2023   Fall at home, initial encounter 09/28/2023   Pressure injury of skin 09/28/2023   Scalp laceration 09/28/2023   Dehydration 09/28/2023   Prolonged QT interval 09/28/2023   Hypothermia 09/28/2023   Underweight (BMI < 18.5) 09/28/2023   Failure to thrive in adult 09/28/2023   Essential hypertension 05/08/2023   Hypotension 04/17/2023   Mitral regurgitation 02/12/2023   Tricuspid regurgitation 02/12/2023   UTI (urinary tract infection) 11/25/2022   Syncope and collapse 11/25/2022   Sinus bradycardia 11/25/2022   Closed dislocation of interphalangeal joint of left thumb 08/01/2020   Laceration of left thumb without foreign body without damage to nail 07/21/2020   Late onset Alzheimer's disease without behavioral disturbance (HCC) 02/24/2020   Dementia without behavioral disturbance (HCC) 10/12/2014   Insomnia 10/12/2014   Hyperlipidemia with target LDL less than 100 08/26/2013   Acquired hypothyroidism 08/26/2013   Hypertension 08/26/2013   Diverticulosis 08/26/2013   GAD (generalized anxiety disorder) 08/26/2013   Neuropathy of both feet 08/26/2013   PCP:  Galvin Proffer, MD Pharmacy:   [facility] [place], [place] - [address] [place] [address] Phone: [phone] Fax: [phone]  [facility] - [place], [place] - [address] [place] [address] Phone: [phone] Fax: [phone]     Social Determinants of Health (SDOH) Social History: SDOH Screenings   Food Insecurity: No Food Insecurity (10/31/2021)  Housing: Low Risk  (10/31/2021)  Transportation Needs: No Transportation Needs (10/31/2021)  Alcohol Screen: Low Risk  (10/31/2021)  Depression (PHQ2-9): Low Risk  (02/28/2022)  Financial Resource  Strain: Low Risk  (10/31/2021)  Physical Activity: Inactive (10/31/2021)  Social Connections: Moderately Isolated (10/31/2021)  Stress: No Stress Concern Present (10/31/2021)  Tobacco Use: Medium Risk (09/28/2023)  Health Literacy: Medium Risk (08/01/2020)   [facility] from [facility], [facility]   SDOH Interventions:     Readmission Risk Interventions    09/29/2023   11:13 AM  Readmission Risk Prevention Plan  Transportation Screening Complete  Medication Review (RN Care Manager) Complete  HRI or Home Care Consult Complete  SW Recovery Care/Counseling Consult Complete  Palliative Care Screening Not Applicable  Skilled Nursing Facility Not Applicable

## 2023-09-29 NOTE — Progress Notes (Signed)
Initial Nutrition Assessment  DOCUMENTATION CODES:   Underweight  INTERVENTION:  Liberalized diet Ensure BID   NUTRITION DIAGNOSIS:   Increased nutrient needs related to chronic illness as evidenced by estimated needs.    GOAL:   Patient will meet greater than or equal to 90% of their needs    MONITOR:   PO intake, Supplement acceptance, Weight trends, Skin, I & O's  REASON FOR ASSESSMENT:   Consult Assessment of nutrition requirement/status  ASSESSMENT:89 y.o F, admitted with acute cystitis.  Mhx;  hypertension, hypothyroidism, dementia, depression.  Documented weight loss. RD reached out via phone with no answer. Review of EMR revealed significant weight loss. And declined oral intake.  There is no benefit to excessive dietary restrictions related to advanced age and declined oral intake. Patient would benefit from a liberalized to help with oral intake.   Admit weight: 39.9 kg Current weight: 39.9 kg  Weight history; 09/29/23 39.9 kg  06/13/23 51.4 kg  05/08/23 51.4 kg  04/28/23 49.1 kg  04/22/23 49.1 kg  04/17/23 49.1 kg  04/13/23 49.9 kg  02/12/23 57.2 kg  01/15/23 55 kg  12/11/22 55 kg      Average Meal Intake: 0% x 1 meal  Nutritionally Relevant Medications: Scheduled Meds:  feeding supplement  237 mL Oral BID BM   memantine  21 mg Oral Daily   potassium chloride  40 mEq Oral BID    Continuous Infusions:  cefTRIAXone (ROCEPHIN)  IV Stopped (09/29/23 1230)     Labs Reviewed   NUTRITION - FOCUSED PHYSICAL EXAM:  Deferred   Diet Order:   Diet Order             Diet regular Room service appropriate? Yes with Assist; Fluid consistency: Thin  Diet effective now                   EDUCATION NEEDS:   Not appropriate for education at this time  Skin:  Skin Assessment: Skin Integrity Issues: Skin Integrity Issues:: Stage II Stage II: medial back  Last BM:  10/19  Height:   Ht Readings from Last 1 Encounters:  09/28/23 5'  4" (1.626 m)    Weight:   Wt Readings from Last 1 Encounters:  09/29/23 39.9 kg    Ideal Body Weight:     BMI:  Body mass index is 15.1 kg/m.  Estimated Nutritional Needs:   Kcal:  1200-1400 kcal/day  Protein:  55-65 g/day  Fluid:  >/= 1543ml/d    Jamelle Haring RDN, LDN Clinical Dietitian  RDN pager # available on Amion

## 2023-09-29 NOTE — Progress Notes (Signed)
PROGRESS NOTE    April [name]  ZOX:[id number] DOB: [birth year] DOA: 09/28/2023 PCP: Galvin Proffer, MD   Brief Narrative:  April [name] is a 89 y.o. female with medical history significant of hypertension, hypothyroidism, dementia, depression who presents to the emergency department from memory care via EMS due to fall sustained at the facility.  Patient had an unwitnessed fall and struck the back of her head, she was noted with bleeding, mental status was not significantly altered.  The cause of fall was unknown and there was no witnessed seizures.  EMS was activated and patient was sent to the ED for further evaluation and management.  She has been admitted with findings of acute cystitis and started on Rocephin empirically with cultures pending and has had scalp laceration repair.  She is also significantly hypokalemic and is improving with potassium supplementation and now has worsening anemia.  She is apparently under comfort care at facility and family members are agreeable to hold off on PRBC transfusion for now.  Assessment & Plan:   Principal Problem:   Acute cystitis Active Problems:   Acquired hypothyroidism   Dementia without behavioral disturbance (HCC)   Hypotension   Essential hypertension   Fall at home, initial encounter   Scalp laceration   Dehydration   Prolonged QT interval   Hypothermia   Underweight (BMI < 18.5)   Failure to thrive in adult  Assessment and Plan:  Acute cystitis Urinalysis positive for UTI Patient was started on IV ceftriaxone, we shall continue same at this time Urine culture pending  Worsening anemia Family members decline PRBC transfusion and did not want further aggressive care Continue to monitor for the sake of prognostication   Fall at home Continue fall precaution Continue PT/OT eval and treat   Scalp laceration s/p repair Stable, continue to monitor   Hypothermia - resolved   Hypotension - resolved    Hypokalemia-improving K+ is 3.0  Continue to replete and monitor   Dehydration Continue IV hydration   Underweight/failure to thrive in adult Protein supplement will be provided Dietitian will be consulted and we shall await further recommendation   Essential hypertension Lisinopril will be held at this time due to soft BP   Mixed hyperlipidemia Continue Lipitor   Acquired hypothyroidism Continue Synthroid   Dementia Continue Namenda   DVT prophylaxis: SCDs Code Status: DNR Family Communication: Son on phone 10/20 Disposition Plan:  Status is: Inpatient Remains inpatient appropriate because: Need for close monitoring.  Consultants:  Palliative of care  Procedures:  None  Antimicrobials:  None   Subjective: Patient seen and evaluated today with noted worsening levels of anemia, but with no overt bleeding.  No other concerns noted at this time.  Objective: Vitals:   09/29/23 1149 09/29/23 1200 09/29/23 1220 09/29/23 1300  BP:  (!) 90/48 (!) 90/50 106/62  Pulse:  81 89 84  Resp:  18 18 18   Temp: 97.6 F (36.4 C)   99.5 F (37.5 C)  TempSrc: Oral   Axillary  SpO2:  97% 95% 95%  Weight:      Height:        Intake/Output Summary (Last 24 hours) at 09/29/2023 1543 Last data filed at 09/29/2023 1400 Gross per 24 hour  Intake 2466.69 ml  Output 600 ml  Net 1866.69 ml   Filed Weights   09/28/23 1437 09/28/23 2100 09/29/23 0500  Weight: 51.4 kg 39.9 kg 39.9 kg    Examination:  General exam: Appears calm and comfortable,  thin/frail Respiratory system: Clear to auscultation. Respiratory effort normal. Cardiovascular system: S1 & S2 heard, RRR.  Gastrointestinal system: Abdomen is soft Central nervous system: Alert and awake Extremities: No edema Skin: No significant lesions noted Psychiatry: Flat affect.    Data Reviewed: I have personally reviewed following labs and imaging studies  CBC: Recent Labs  Lab 09/28/23 1530 09/29/23 0357  09/29/23 0600 09/29/23 1337  WBC 4.4 6.5  --   --   NEUTROABS 2.9  --   --   --   HGB 7.9* 6.7* 6.6* 6.0*  HCT 25.1* 22.7* 21.5* 19.4*  MCV 86.0 91.5  --   --   PLT 335 335  --   --    Basic Metabolic Panel: Recent Labs  Lab 09/28/23 1530 09/29/23 0357 09/29/23 1337  NA 144 143  --   K 2.3* 2.9* 3.0*  CL 102 108  --   CO2 32 23  --   GLUCOSE 96 80  --   BUN 36* 36*  --   CREATININE 0.81 1.18*  --   CALCIUM 8.1* 7.7*  --   MG  --  2.0  --   PHOS  --  1.2*  --    GFR: Estimated Creatinine Clearance: 20.4 mL/min (A) (by C-G formula based on SCr of 1.18 mg/dL (H)). Liver Function Tests: Recent Labs  Lab 09/29/23 0357  AST 43*  ALT 20  ALKPHOS 95  BILITOT 1.0  PROT 5.7*  ALBUMIN 2.3*   No results for input(s): "LIPASE", "AMYLASE" in the last 168 hours. No results for input(s): "AMMONIA" in the last 168 hours. Coagulation Profile: No results for input(s): "INR", "PROTIME" in the last 168 hours. Cardiac Enzymes: No results for input(s): "CKTOTAL", "CKMB", "CKMBINDEX", "TROPONINI" in the last 168 hours. BNP (last 3 results) No results for input(s): "PROBNP" in the last 8760 hours. HbA1C: No results for input(s): "HGBA1C" in the last 72 hours. CBG: Recent Labs  Lab 09/28/23 2129 09/28/23 2144 09/28/23 2341 09/29/23 0524 09/29/23 1323  GLUCAP 68* 132* 104* 82 81   Lipid Profile: No results for input(s): "CHOL", "HDL", "LDLCALC", "TRIG", "CHOLHDL", "LDLDIRECT" in the last 72 hours. Thyroid Function Tests: No results for input(s): "TSH", "T4TOTAL", "FREET4", "T3FREE", "THYROIDAB" in the last 72 hours. Anemia Panel: No results for input(s): "VITAMINB12", "FOLATE", "FERRITIN", "TIBC", "IRON", "RETICCTPCT" in the last 72 hours. Sepsis Labs: No results for input(s): "PROCALCITON", "LATICACIDVEN" in the last 168 hours.  Recent Results (from the past 240 hour(s))  MRSA Next Gen by PCR, Nasal     Status: None   Collection Time: 09/28/23  8:35 PM   Specimen: Nasal  Mucosa; Nasal Swab  Result Value Ref Range Status   MRSA by PCR Next Gen NOT DETECTED NOT DETECTED Final    Comment: (NOTE) The GeneXpert MRSA Assay (FDA approved for NASAL specimens only), is one component of a comprehensive MRSA colonization surveillance program. It is not intended to diagnose MRSA infection nor to guide or monitor treatment for MRSA infections. Test performance is not FDA approved in patients less than 37 years old. Performed at [facility], [address], [place], [place] [address]          Radiology Studies: CT Cervical Spine Wo Contrast  Result Date: 09/28/2023 CLINICAL DATA:  Fall, blunt trauma EXAM: CT CERVICAL SPINE WITHOUT CONTRAST TECHNIQUE: Multidetector CT imaging of the cervical spine was performed without intravenous contrast. Multiplanar CT image reconstructions were also generated. RADIATION DOSE REDUCTION: This exam was performed according  to the departmental dose-optimization program which includes automated exposure control, adjustment of the mA and/or kV according to patient size and/or use of iterative reconstruction technique. COMPARISON:  06/13/2023 FINDINGS: Alignment: Facet joints are aligned without dislocation or traumatic listhesis. Dens and lateral masses are aligned. Degenerative facet-mediated stepwise anterolisthesis at C4-5, C5-6, C6-7, and C7-T1. Skull base and vertebrae: No acute fracture. No primary bone lesion or focal pathologic process. Soft tissues and spinal canal: No prevertebral fluid or swelling. No visible canal hematoma. Disc levels:  Multilevel facet-predominant cervical spondylosis. Upper chest: Emphysema. Other: Bilateral carotid atherosclerosis. IMPRESSION: No acute fracture or traumatic listhesis of the cervical spine. Electronically Signed   By: Duanne Guess D.O.   On: 09/28/2023 17:36   CT Head Wo Contrast  Result Date: 09/28/2023 CLINICAL DATA:  Fall, scalp laceration EXAM: CT HEAD WITHOUT CONTRAST TECHNIQUE:  Contiguous axial images were obtained from the base of the skull through the vertex without intravenous contrast. RADIATION DOSE REDUCTION: This exam was performed according to the departmental dose-optimization program which includes automated exposure control, adjustment of the mA and/or kV according to patient size and/or use of iterative reconstruction technique. COMPARISON:  08/06/2023 FINDINGS: Brain: No evidence of acute infarction, hemorrhage, hydrocephalus, extra-axial collection or mass lesion/mass effect. Scattered low-density changes within the periventricular and subcortical white matter most compatible with chronic microvascular ischemic change. Mild diffuse cerebral volume loss. Vascular: Atherosclerotic calcifications involving the large vessels of the skull base. No unexpected hyperdense vessel. Skull: Normal. Negative for fracture or focal lesion. Sinuses/Orbits: No acute finding. Other: Soft tissue swelling of the high left parietal scalp with laceration. IMPRESSION: 1. No acute intracranial findings. 2. Soft tissue swelling of the high left parietal scalp with laceration. No underlying calvarial fracture. Electronically Signed   By: Duanne Guess D.O.   On: 09/28/2023 17:33   DG Chest Port 1 View  Result Date: 09/28/2023 CLINICAL DATA:  Altered EXAM: PORTABLE CHEST 1 VIEW COMPARISON:  08/06/2023 FINDINGS: The heart size and mediastinal contours are stable. Aortic atherosclerosis. No focal airspace consolidation, pleural effusion, or pneumothorax. The visualized skeletal structures are unremarkable. IMPRESSION: No active cardiopulmonary disease. Electronically Signed   By: Duanne Guess D.O.   On: 09/28/2023 17:31        Scheduled Meds:  sodium chloride   Intravenous Once   atorvastatin  20 mg Oral Daily   bacitracin   Topical BID   feeding supplement  237 mL Oral BID BM   levothyroxine  75 mcg Oral Q0600   memantine  21 mg Oral Daily   mouth rinse  15 mL Mouth Rinse 4  times per day   pantoprazole (PROTONIX) IV  40 mg Intravenous Q24H   potassium chloride  40 mEq Oral BID   Continuous Infusions:  cefTRIAXone (ROCEPHIN)  IV Stopped (09/29/23 1230)     LOS: 1 day    Time spent: 35 minutes    Aliea Bobe Hoover Brunette, DO [facility]  If 7PM-7AM, please contact night-coverage [email or web address] 09/29/2023, 3:43 PM

## 2023-09-29 NOTE — Progress Notes (Signed)
PT Cancellation Note  Patient Details Name: April [name] MRN: [id number] DOB: July 25, 1932   Cancelled Treatment:     PT's nurse requested for therapist to hold treatment.   Virgina Organ, PT CLT [phone]  09/29/2023, 11:37 AM

## 2023-09-30 DIAGNOSIS — N3000 Acute cystitis without hematuria: Secondary | ICD-10-CM | POA: Diagnosis not present

## 2023-09-30 LAB — URINE CULTURE

## 2023-09-30 LAB — GLUCOSE, CAPILLARY
Glucose-Capillary: 113 mg/dL — ABNORMAL HIGH (ref 70–99)
Glucose-Capillary: 113 mg/dL — ABNORMAL HIGH (ref 70–99)
Glucose-Capillary: 114 mg/dL — ABNORMAL HIGH (ref 70–99)
Glucose-Capillary: 114 mg/dL — ABNORMAL HIGH (ref 70–99)
Glucose-Capillary: 119 mg/dL — ABNORMAL HIGH (ref 70–99)
Glucose-Capillary: 137 mg/dL — ABNORMAL HIGH (ref 70–99)
Glucose-Capillary: 85 mg/dL (ref 70–99)

## 2023-09-30 LAB — POTASSIUM: Potassium: 4.1 mmol/L (ref 3.5–5.1)

## 2023-09-30 LAB — HEMOGLOBIN AND HEMATOCRIT, BLOOD
HCT: 21.8 % — ABNORMAL LOW (ref 36.0–46.0)
Hemoglobin: 6.5 g/dL — CL (ref 12.0–15.0)

## 2023-09-30 MED ORDER — ENSURE ENLIVE PO LIQD: 237.0000 mL | Freq: Two times a day (BID) | ORAL | 12 refills | Status: AC

## 2023-09-30 MED ORDER — LORAZEPAM 0.5 MG PO TABS: 0.5000 mg | ORAL_TABLET | Freq: Every evening | ORAL | 0 refills | Status: AC

## 2023-09-30 MED ORDER — QUETIAPINE FUMARATE 25 MG PO TABS: 25.0000 mg | ORAL_TABLET | Freq: Every day | ORAL | 0 refills | Status: AC

## 2023-09-30 MED ORDER — BACITRACIN ZINC 500 UNIT/GM EX OINT: TOPICAL_OINTMENT | Freq: Two times a day (BID) | CUTANEOUS | 0 refills | Status: AC

## 2023-09-30 NOTE — Progress Notes (Signed)
Hgb 6.5 per lab, Dr. Thomes Dinning notified.

## 2023-09-30 NOTE — Discharge Summary (Signed)
Physician Discharge Summary  Charna Fathi Braatz VQQ:[id number] DOB: [birth year] DOA: 09/28/2023  PCP: Galvin Proffer, MD  Admit date: 09/28/2023  Discharge date: 09/30/2023  Admitted From:ALF/hospice  Disposition:  ALF/hospice  Recommendations for Outpatient Follow-up:  Follow up with PCP in 1-2 weeks Continue medications as noted below Removal of scalp staples 10/26 Continue hospice care  Home Health: None  Equipment/Devices: None  Discharge Condition:Stable  CODE STATUS: Full  Diet recommendation: Heart Healthy  Brief/Interim Summary: April [name] is a 89 y.o. female with medical history significant of hypertension, hypothyroidism, dementia, depression who presents to the emergency department from memory care via EMS due to fall sustained at the facility.  Patient had an unwitnessed fall and struck the back of her head, she was noted with bleeding, mental status was not significantly altered.  The cause of fall was unknown and there was no witnessed seizures.  EMS was activated and patient was sent to the ED for further evaluation and management.  She received 3 staples to her scalp in the ED.  She was admitted with UTI and treated with 3 days course of IV Rocephin and had multiple species of growth noted on urine culture.  She was also noted to be significantly hypokalemic and has received adequate potassium supplementation.  She was also noted to have worsening anemia possibly related to bleed from her scalp and this has stabilized.  Family members declined any PRBC transfusion as she is currently under hospice care and likely will not make any difference in her long-term prognosis.  She is overall in stable condition for discharge today back to her facility with ongoing hospice care.  No other acute events or concerns noted.  Discharge Diagnoses:  Principal Problem:   Acute cystitis Active Problems:   Acquired hypothyroidism   Dementia without behavioral disturbance (HCC)    Hypotension   Essential hypertension   Fall at home, initial encounter   Scalp laceration   Dehydration   Prolonged QT interval   Hypothermia   Underweight (BMI < 18.5)   Failure to thrive in adult  Principal discharge diagnosis: Fall with scalp laceration in the setting of severe hypokalemia as well as acute cystitis and noted worsening anemia.  Discharge Instructions  Discharge Instructions     Diet - low sodium heart healthy   Complete by: As directed    Discharge wound care:   Complete by: As directed    Remove scalp staples 10/26   If the dressing is still on your incision site when you go home, remove it on the third day after your surgery date. Remove dressing if it begins to fall off, or if it is dirty or damaged before the third day.   Complete by: As directed    Increase activity slowly   Complete by: As directed       Allergies as of 09/30/2023       Reactions   Acid Reducer [cimetidine] Nausea Only   Mevacor [lovastatin] Nausea Only        Medication List     STOP taking these medications    aspirin 81 MG tablet   lisinopril 20 MG tablet Commonly known as: ZESTRIL       TAKE these medications    atorvastatin 20 MG tablet Commonly known as: LIPITOR Take 20 mg by mouth daily.   bacitracin ointment Apply topically 2 (two) times daily.   feeding supplement Liqd Take 237 mLs by mouth 2 (two) times daily between meals.  folic acid 1 MG tablet Commonly known as: FOLVITE Take 1 mg by mouth daily.   Iron 325 (65 Fe) MG Tabs Take 1 tablet by mouth daily.   levETIRAcetam 500 MG tablet Commonly known as: KEPPRA Take 500 mg by mouth 2 (two) times daily.   levothyroxine 75 MCG tablet Commonly known as: SYNTHROID Take 1 tablet (75 mcg total) by mouth daily before breakfast.   LORazepam 0.5 MG tablet Commonly known as: ATIVAN Take 1 tablet (0.5 mg total) by mouth every evening.   melatonin 3 MG Tabs tablet Take 3 mg by mouth at bedtime.    memantine 21 MG Cp24 24 hr capsule Commonly known as: NAMENDA XR TAKE (1) CAPSULE DAILY   mirtazapine 7.5 MG tablet Commonly known as: REMERON Take 7.5 mg by mouth at bedtime.   QUEtiapine 25 MG tablet Commonly known as: SEROquel Take 1 tablet (25 mg total) by mouth at bedtime.   sertraline 50 MG tablet Commonly known as: ZOLOFT Take 1 tablet (50 mg total) by mouth daily.               Discharge Care Instructions  (From admission, onward)           Start     Ordered   09/30/23 0000  If the dressing is still on your incision site when you go home, remove it on the third day after your surgery date. Remove dressing if it begins to fall off, or if it is dirty or damaged before the third day.        09/30/23 1135   09/30/23 0000  Discharge wound care:       Comments: Remove scalp staples 10/26   09/30/23 1135            Follow-up Information     Hague, Myrene Galas, MD. Schedule an appointment as soon as possible for a visit in 1 week(s).   Specialty: Internal Medicine Contact information: [address] [place] [address] [phone]                Allergies  Allergen Reactions   Acid Reducer [Cimetidine] Nausea Only   Mevacor [Lovastatin] Nausea Only    Consultations: None   Procedures/Studies: CT Cervical Spine Wo Contrast  Result Date: 09/28/2023 CLINICAL DATA:  Fall, blunt trauma EXAM: CT CERVICAL SPINE WITHOUT CONTRAST TECHNIQUE: Multidetector CT imaging of the cervical spine was performed without intravenous contrast. Multiplanar CT image reconstructions were also generated. RADIATION DOSE REDUCTION: This exam was performed according to the departmental dose-optimization program which includes automated exposure control, adjustment of the mA and/or kV according to patient size and/or use of iterative reconstruction technique. COMPARISON:  06/13/2023 FINDINGS: Alignment: Facet joints are aligned without dislocation or traumatic  listhesis. Dens and lateral masses are aligned. Degenerative facet-mediated stepwise anterolisthesis at C4-5, C5-6, C6-7, and C7-T1. Skull base and vertebrae: No acute fracture. No primary bone lesion or focal pathologic process. Soft tissues and spinal canal: No prevertebral fluid or swelling. No visible canal hematoma. Disc levels:  Multilevel facet-predominant cervical spondylosis. Upper chest: Emphysema. Other: Bilateral carotid atherosclerosis. IMPRESSION: No acute fracture or traumatic listhesis of the cervical spine. Electronically Signed   By: Duanne Guess D.O.   On: 09/28/2023 17:36   CT Head Wo Contrast  Result Date: 09/28/2023 CLINICAL DATA:  Fall, scalp laceration EXAM: CT HEAD WITHOUT CONTRAST TECHNIQUE: Contiguous axial images were obtained from the base of the skull through the vertex without intravenous contrast. RADIATION DOSE REDUCTION: This  exam was performed according to the departmental dose-optimization program which includes automated exposure control, adjustment of the mA and/or kV according to patient size and/or use of iterative reconstruction technique. COMPARISON:  08/06/2023 FINDINGS: Brain: No evidence of acute infarction, hemorrhage, hydrocephalus, extra-axial collection or mass lesion/mass effect. Scattered low-density changes within the periventricular and subcortical white matter most compatible with chronic microvascular ischemic change. Mild diffuse cerebral volume loss. Vascular: Atherosclerotic calcifications involving the large vessels of the skull base. No unexpected hyperdense vessel. Skull: Normal. Negative for fracture or focal lesion. Sinuses/Orbits: No acute finding. Other: Soft tissue swelling of the high left parietal scalp with laceration. IMPRESSION: 1. No acute intracranial findings. 2. Soft tissue swelling of the high left parietal scalp with laceration. No underlying calvarial fracture. Electronically Signed   By: Duanne Guess D.O.   On: 09/28/2023  17:33   DG Chest Port 1 View  Result Date: 09/28/2023 CLINICAL DATA:  Altered EXAM: PORTABLE CHEST 1 VIEW COMPARISON:  08/06/2023 FINDINGS: The heart size and mediastinal contours are stable. Aortic atherosclerosis. No focal airspace consolidation, pleural effusion, or pneumothorax. The visualized skeletal structures are unremarkable. IMPRESSION: No active cardiopulmonary disease. Electronically Signed   By: Duanne Guess D.O.   On: 09/28/2023 17:31     Discharge Exam: Vitals:   09/29/23 2001 09/30/23 0328  BP: 107/62 (!) 117/58  Pulse: 97 94  Resp: 20 20  Temp: 99.1 F (37.3 C) 97.6 F (36.4 C)  SpO2: 97% 99%   Vitals:   09/29/23 1300 09/29/23 1638 09/29/23 2001 09/30/23 0328  BP: 106/62 116/74 107/62 (!) 117/58  Pulse: 84 77 97 94  Resp: 18 18 20 20   Temp: 99.5 F (37.5 C) 97.7 F (36.5 C) 99.1 F (37.3 C) 97.6 F (36.4 C)  TempSrc: Axillary Axillary Oral   SpO2: 95% 100% 97% 99%  Weight:      Height:        General: Pt is alert, awake, not in acute distress Cardiovascular: RRR, S1/S2 +, no rubs, no gallops Respiratory: CTA bilaterally, no wheezing, no rhonchi Abdominal: Soft, NT, ND, bowel sounds + Extremities: no edema, no cyanosis    The results of significant diagnostics from this hospitalization (including imaging, microbiology, ancillary and laboratory) are listed below for reference.     Microbiology: Recent Results (from the past 240 hour(s))  Urine Culture     Status: Abnormal   Collection Time: 09/28/23  4:45 PM   Specimen: Urine, Catheterized  Result Value Ref Range Status   Specimen Description   Final    URINE, CATHETERIZED Performed at [facility], [address] [place], [place] [address]    Special Requests   Final    NONE Performed at [facility], [address], [place], [place] [address]    Culture MULTIPLE SPECIES PRESENT, SUGGEST RECOLLECTION (A)  Final   Report Status 09/30/2023 FINAL  Final  MRSA Next Gen by PCR, Nasal      Status: None   Collection Time: 09/28/23  8:35 PM   Specimen: Nasal Mucosa; Nasal Swab  Result Value Ref Range Status   MRSA by PCR Next Gen NOT DETECTED NOT DETECTED Final    Comment: (NOTE) The GeneXpert MRSA Assay (FDA approved for NASAL specimens only), is one component of a comprehensive MRSA colonization surveillance program. It is not intended to diagnose MRSA infection nor to guide or monitor treatment for MRSA infections. Test performance is not FDA approved in patients less than 7 years old. Performed at [facility], [address], [place], [place] [address]      Labs: BNP (last 3 results) No results for input(s): "BNP" in the last 8760 hours. Basic Metabolic Panel: Recent Labs  Lab 09/28/23 1530 09/29/23 0357 09/29/23 1337 09/30/23 0539  NA 144 143  --   --   K 2.3* 2.9* 3.0* 4.1  CL 102 108  --   --   CO2 32 23  --   --   GLUCOSE 96 80  --   --   BUN 36* 36*  --   --   CREATININE 0.81 1.18*  --   --   CALCIUM 8.1* 7.7*  --   --   MG  --  2.0  --   --   PHOS  --  1.2*  --   --    Liver Function Tests: Recent Labs  Lab 09/29/23 0357  AST 43*  ALT 20  ALKPHOS 95  BILITOT 1.0  PROT 5.7*  ALBUMIN 2.3*   No results for input(s): "LIPASE", "AMYLASE" in the last 168 hours. No results for input(s): "AMMONIA" in the last 168 hours. CBC: Recent Labs  Lab 09/28/23 1530 09/29/23 0357 09/29/23 0600 09/29/23 1337 09/30/23 0539  WBC 4.4 6.5  --   --   --   NEUTROABS 2.9  --   --   --   --   HGB 7.9* 6.7* 6.6* 6.0* 6.5*  HCT 25.1* 22.7* 21.5* 19.4* 21.8*  MCV 86.0 91.5  --   --   --   PLT 335 335  --   --   --    Cardiac Enzymes: No results for input(s): "CKTOTAL", "CKMB", "CKMBINDEX", "TROPONINI" in the last 168 hours. BNP: Invalid input(s): "POCBNP" CBG: Recent Labs  Lab 09/29/23 1615 09/29/23 1956 09/29/23 2346 09/30/23 0335 09/30/23 0810  GLUCAP 123* 129* 100* 119* 114*   D-Dimer No results for input(s): "DDIMER" in the last 72  hours. Hgb A1c No results for input(s): "HGBA1C" in the last 72 hours. Lipid Profile No results for input(s): "CHOL", "HDL", "LDLCALC", "TRIG", "CHOLHDL", "LDLDIRECT" in the last 72 hours. Thyroid function studies No results for input(s): "TSH", "T4TOTAL", "T3FREE", "THYROIDAB" in the last 72 hours.  Invalid input(s): "FREET3" Anemia work up No results for input(s): "VITAMINB12", "FOLATE", "FERRITIN", "TIBC", "IRON", "RETICCTPCT" in the last 72 hours. Urinalysis    Component Value Date/Time   COLORURINE YELLOW 09/28/2023 1645   APPEARANCEUR HAZY (A) 09/28/2023 1645   APPEARANCEUR Clear 12/29/2021 1124   LABSPEC 1.014 09/28/2023 1645   PHURINE 5.0 09/28/2023 1645   GLUCOSEU NEGATIVE 09/28/2023 1645   HGBUR SMALL (A) 09/28/2023 1645   BILIRUBINUR NEGATIVE 09/28/2023 1645   BILIRUBINUR Negative 12/29/2021 1124   KETONESUR NEGATIVE 09/28/2023 1645   PROTEINUR NEGATIVE 09/28/2023 1645   NITRITE POSITIVE (A) 09/28/2023 1645   LEUKOCYTESUR NEGATIVE 09/28/2023 1645   Sepsis Labs Recent Labs  Lab 09/28/23 1530 09/29/23 0357  WBC 4.4 6.5   Microbiology Recent Results (from the past 240 hour(s))  Urine Culture     Status: Abnormal   Collection Time: 09/28/23  4:45 PM   Specimen: Urine, Catheterized  Result Value Ref Range Status   Specimen Description   Final    URINE, CATHETERIZED Performed at [facility], [address], [place], [place] [address]    Special Requests   Final    NONE Performed at [facility], [address] [place], [place] [address]    Culture MULTIPLE SPECIES PRESENT, SUGGEST RECOLLECTION (A)  Final   Report Status 09/30/2023 FINAL  Final  MRSA Next Gen by PCR, Nasal     Status: None   Collection Time: 09/28/23  8:35 PM   Specimen: Nasal Mucosa; Nasal Swab  Result Value Ref Range Status   MRSA by PCR Next Gen NOT DETECTED NOT DETECTED Final    Comment: (NOTE) The GeneXpert MRSA Assay (FDA approved for NASAL specimens only), is one component of a  comprehensive MRSA colonization surveillance program. It is not intended to diagnose MRSA infection nor to guide or monitor treatment for MRSA infections. Test performance is not FDA approved in patients less than 8 years old. Performed at [facility], [address] [place], [place] [address]      Time coordinating discharge: 35 minutes  SIGNED:   Erick Blinks, DO [facility] 09/30/2023, 11:44 AM  If 7PM-7AM, please contact night-coverage [email or web address]

## 2023-09-30 NOTE — TOC Transition Note (Signed)
Transition of Care [facility]) - CM/SW Discharge Note   Patient Details  Name: April [name] MRN: [id number] Date of Birth: Aug 04, 1932  [facility] of [facility]) CM/SW Contact:  Villa Herb, LCSWA Phone Number: 09/30/2023, 1:37 PM  Clinical Narrative:    CSW updated that pt is medically stable for D/C back to [facility]. CSW spoke to the facility and they are ready to accept back. D/C clinicals and Fl2 sent to facility. CSW updated pts son of plan for D/C. Med necessity printed and sent to the floor for RN. RN updated of no need for calling report as pt is going to ALF. EMS called for transport. TOC signing off.   Final next level of care: Memory Care Barriers to Discharge: Barriers Resolved   Patient Goals and CMS Choice CMS Medicare.gov Compare Post Acute Care list provided to:: Patient Represenative (must comment) Choice offered to / list presented to : Adult Children  Discharge Placement                  Patient to be transferred to facility by: EMS Name of family member notified: Son Casimiro Needle Patient and family notified of of transfer: 09/30/23  Discharge Plan and Services Additional resources added to the After Visit Summary for   In-house Referral: Clinical Social Work Discharge Planning Services: CM Consult                                 Social Determinants of Health (SDOH) Interventions SDOH Screenings   Food Insecurity: No Food Insecurity (10/31/2021)  Housing: Low Risk  (10/31/2021)  Transportation Needs: No Transportation Needs (10/31/2021)  Alcohol Screen: Low Risk  (10/31/2021)  Depression (PHQ2-9): Low Risk  (02/28/2022)  Financial Resource Strain: Low Risk  (10/31/2021)  Physical Activity: Inactive (10/31/2021)  Social Connections: Moderately Isolated (10/31/2021)  Stress: No Stress Concern Present (10/31/2021)  Tobacco Use: Medium Risk (09/28/2023)  Health Literacy: Medium Risk (08/01/2020)   Received from [facility], [facility]     Readmission Risk Interventions    09/29/2023   11:13 AM  Readmission Risk Prevention Plan  Transportation Screening Complete  Medication Review (RN Care Manager) Complete  HRI or Home Care Consult Complete  SW Recovery Care/Counseling Consult Complete  Palliative Care Screening Not Applicable  Skilled Nursing Facility Not Applicable

## 2023-09-30 NOTE — Care Management Important Message (Signed)
Important Message  Patient Details  Name: April [name] MRN: [id number] Date of Birth: 1932-06-14   Important Message Given:  N/A - LOS <3 / Initial given by admissions     Corey Harold 09/30/2023, 3:05 PM

## 2023-09-30 NOTE — NC FL2 (Signed)
[place] MEDICAID FL2 LEVEL OF CARE FORM     IDENTIFICATION  Patient Name: April [name] Birthdate: 12-27-1931 Sex: female Admission Date (Current Location): 09/28/2023  [facility] and [place] Number:  [employer] and Address:  [facility],  [address], [place] [address]      Provider Number: [id number]  Attending Physician Name and Address:  Erick Blinks, DO  Relative Name and Phone Number:       Current Level of Care: Hospital Recommended Level of Care: Memory Care Prior Approval Number:    Date Approved/Denied:   PASRR Number:    Discharge Plan: Other (Comment)    Current Diagnoses: Patient Active Problem List   Diagnosis Date Noted   Acute cystitis 09/28/2023   Fall at home, initial encounter 09/28/2023   Pressure injury of skin 09/28/2023   Scalp laceration 09/28/2023   Dehydration 09/28/2023   Prolonged QT interval 09/28/2023   Hypothermia 09/28/2023   Underweight (BMI < 18.5) 09/28/2023   Failure to thrive in adult 09/28/2023   Essential hypertension 05/08/2023   Hypotension 04/17/2023   Mitral regurgitation 02/12/2023   Tricuspid regurgitation 02/12/2023   UTI (urinary tract infection) 11/25/2022   Syncope and collapse 11/25/2022   Sinus bradycardia 11/25/2022   Closed dislocation of interphalangeal joint of left thumb 08/01/2020   Laceration of left thumb without foreign body without damage to nail 07/21/2020   Late onset Alzheimer's disease without behavioral disturbance (HCC) 02/24/2020   Dementia without behavioral disturbance (HCC) 10/12/2014   Insomnia 10/12/2014   Hyperlipidemia with target LDL less than 100 08/26/2013   Acquired hypothyroidism 08/26/2013   Hypertension 08/26/2013   Diverticulosis 08/26/2013   GAD (generalized anxiety disorder) 08/26/2013   Neuropathy of both feet 08/26/2013    Orientation RESPIRATION BLADDER Height & Weight     Self  Normal Incontinent Weight: 87 lb 15.4 oz (39.9  kg) Height:  5\' 4"  (162.6 cm)  BEHAVIORAL SYMPTOMS/MOOD NEUROLOGICAL BOWEL NUTRITION STATUS      Incontinent Diet (Heart healthy)  AMBULATORY STATUS COMMUNICATION OF NEEDS Skin   Extensive Assist Verbally Normal                       Personal Care Assistance Level of Assistance  Bathing, Feeding, Dressing Bathing Assistance: Maximum assistance Feeding assistance: Limited assistance Dressing Assistance: Maximum assistance     Functional Limitations Info  Sight, Hearing, Speech Sight Info: Adequate Hearing Info: Adequate Speech Info: Adequate    SPECIAL CARE FACTORS FREQUENCY                       Contractures Contractures Info: Not present    Additional Factors Info  Code Status, Allergies Code Status Info: DNR Limited Allergies Info: Acid Reducer (Cimetidine), Mevacor (Lovastatin)           Current Medications (09/30/2023):  This is the current hospital active medication list Current Facility-Administered Medications  Medication Dose Route Frequency Provider Last Rate Last Admin   0.9 %  sodium chloride infusion (Manually program via Guardrails IV Fluids)   Intravenous Once Sherryll Burger, Pratik D, DO       acetaminophen (TYLENOL) tablet 650 mg  650 mg Oral Q6H PRN Adefeso, Oladapo, DO       Or   acetaminophen (TYLENOL) suppository 650 mg  650 mg Rectal Q6H PRN Adefeso, Oladapo, DO       atorvastatin (LIPITOR) tablet 20 mg  20 mg Oral Daily Adefeso, Oladapo,  DO   20 mg at 09/30/23 1012   bacitracin ointment   Topical BID Frankey Shown, DO   Given at 09/29/23 1205   feeding supplement (ENSURE ENLIVE / ENSURE PLUS) liquid 237 mL  237 mL Oral BID BM Adefeso, Oladapo, DO   237 mL at 09/30/23 1025   levothyroxine (SYNTHROID) tablet 75 mcg  75 mcg Oral Q0600 Adefeso, Oladapo, DO   75 mcg at 09/30/23 0528   memantine (NAMENDA XR) 24 hr capsule 21 mg  21 mg Oral Daily Sherryll Burger, Pratik D, DO   21 mg at 09/30/23 1010   Oral care mouth rinse  15 mL Mouth Rinse 4 times per day  Adefeso, Oladapo, DO   15 mL at 09/30/23 1031   Oral care mouth rinse  15 mL Mouth Rinse PRN Adefeso, Oladapo, DO       pantoprazole (PROTONIX) injection 40 mg  40 mg Intravenous Q24H Sherryll Burger, Pratik D, DO   40 mg at 09/30/23 1015   prochlorperazine (COMPAZINE) injection 10 mg  10 mg Intravenous Q6H PRN Frankey Shown, DO         Discharge Medications: Allergies as of 09/30/2023       Reactions   Acid Reducer [cimetidine] Nausea Only   Mevacor [lovastatin] Nausea Only        Medication List     STOP taking these medications    aspirin 81 MG tablet   lisinopril 20 MG tablet Commonly known as: ZESTRIL       TAKE these medications    atorvastatin 20 MG tablet Commonly known as: LIPITOR Take 20 mg by mouth daily.   bacitracin ointment Apply topically 2 (two) times daily.   feeding supplement Liqd Take 237 mLs by mouth 2 (two) times daily between meals.   folic acid 1 MG tablet Commonly known as: FOLVITE Take 1 mg by mouth daily.   Iron 325 (65 Fe) MG Tabs Take 1 tablet by mouth daily.   levETIRAcetam 500 MG tablet Commonly known as: KEPPRA Take 500 mg by mouth 2 (two) times daily.   levothyroxine 75 MCG tablet Commonly known as: SYNTHROID Take 1 tablet (75 mcg total) by mouth daily before breakfast.   LORazepam 0.5 MG tablet Commonly known as: ATIVAN Take 1 tablet (0.5 mg total) by mouth every evening.   melatonin 3 MG Tabs tablet Take 3 mg by mouth at bedtime.   memantine 21 MG Cp24 24 hr capsule Commonly known as: NAMENDA XR TAKE (1) CAPSULE DAILY   mirtazapine 7.5 MG tablet Commonly known as: REMERON Take 7.5 mg by mouth at bedtime.   QUEtiapine 25 MG tablet Commonly known as: SEROquel Take 1 tablet (25 mg total) by mouth at bedtime.   sertraline 50 MG tablet Commonly known as: ZOLOFT Take 1 tablet (50 mg total) by mouth daily.               Discharge Care Instructions  (From admission, onward)           Start     Ordered    09/30/23 0000  If the dressing is still on your incision site when you go home, remove it on the third day after your surgery date. Remove dressing if it begins to fall off, or if it is dirty or damaged before the third day.        09/30/23 1135   09/30/23 0000  Discharge wound care:       Comments: Remove scalp staples 10/26   09/30/23  1135             Relevant Imaging Results:  Relevant Lab Results:   Additional Information SSN: [id number] [place]

## 2023-09-30 NOTE — Consult Note (Signed)
[facility] Customer service manager [facility]) Accountable Care Organization (ACO) [facility] Liaison Note  09/30/2023  April [name] [birth year] [id number]  Location: [facility] Hospital Liaison screened the patient remotely at [facility].  Insurance: [employer]   April [name] is a 89 y.o. female who is a Primary Care Patient of [place], Myrene Galas, MD. The patient was screened for readmission hospitalization with noted extreme risk score for unplanned readmission risk with 2 IP/7 ED in 6 months.  The patient was assessed for potential [facility]) Care Management service needs for post hospital transition for care coordination. Review of patient's electronic medical record reveals patient was admitted for Acute Cystitis. Pt will discharged back to her memory care facility [name]).     [facility] Care Management/Population Health does not replace or interfere with any arrangements made by the Inpatient Transition of Care team.   For questions contact:   Elliot Cousin, RN, [facility] Liaison [place]   [facility], Population Health Office Hours MTWF  8:00 am-6:00 pm Direct Dial: [phone] mobile [phone] [Office toll free line] Office Hours are M-F 8:30 - 5 pm [email or web address]

## 2023-10-01 NOTE — Plan of Care (Signed)
  Problem: Education: Goal: Knowledge of General Education information will improve Description Including pain rating scale, medication(s)/side effects and non-pharmacologic comfort measures Outcome: Progressing   Problem: Health Behavior/Discharge Planning: Goal: Ability to manage health-related needs will improve Outcome: Progressing   

## 2023-10-02 DIAGNOSIS — G309 Alzheimer's disease, unspecified: Secondary | ICD-10-CM | POA: Diagnosis not present

## 2023-10-02 DIAGNOSIS — R279 Unspecified lack of coordination: Secondary | ICD-10-CM | POA: Diagnosis not present

## 2023-10-02 DIAGNOSIS — M6281 Muscle weakness (generalized): Secondary | ICD-10-CM | POA: Diagnosis not present

## 2023-10-11 DEATH — deceased
